# Patient Record
Sex: Female | Born: 1970 | Race: White | Hispanic: No | Smoking: Former smoker
Health system: Southern US, Community
[De-identification: ages and names within clinical notes are randomized; demographics above are authoritative.]

## PROBLEM LIST (undated history)

## (undated) DIAGNOSIS — N200 Calculus of kidney: Secondary | ICD-10-CM

## (undated) DIAGNOSIS — F329 Major depressive disorder, single episode, unspecified: Secondary | ICD-10-CM

## (undated) DIAGNOSIS — Z1371 Encounter for nonprocreative screening for genetic disease carrier status: Secondary | ICD-10-CM

## (undated) DIAGNOSIS — K859 Acute pancreatitis without necrosis or infection, unspecified: Secondary | ICD-10-CM

## (undated) DIAGNOSIS — I499 Cardiac arrhythmia, unspecified: Secondary | ICD-10-CM

## (undated) DIAGNOSIS — I639 Cerebral infarction, unspecified: Secondary | ICD-10-CM

## (undated) DIAGNOSIS — R519 Headache, unspecified: Secondary | ICD-10-CM

## (undated) DIAGNOSIS — R197 Diarrhea, unspecified: Secondary | ICD-10-CM

## (undated) DIAGNOSIS — A4902 Methicillin resistant Staphylococcus aureus infection, unspecified site: Secondary | ICD-10-CM

## (undated) DIAGNOSIS — R112 Nausea with vomiting, unspecified: Secondary | ICD-10-CM

## (undated) DIAGNOSIS — F32A Depression, unspecified: Secondary | ICD-10-CM

## (undated) DIAGNOSIS — N83209 Unspecified ovarian cyst, unspecified side: Secondary | ICD-10-CM

## (undated) DIAGNOSIS — Z9889 Other specified postprocedural states: Secondary | ICD-10-CM

## (undated) DIAGNOSIS — C50911 Malignant neoplasm of unspecified site of right female breast: Secondary | ICD-10-CM

## (undated) DIAGNOSIS — I10 Essential (primary) hypertension: Secondary | ICD-10-CM

## (undated) DIAGNOSIS — IMO0001 Reserved for inherently not codable concepts without codable children: Secondary | ICD-10-CM

## (undated) DIAGNOSIS — R51 Headache: Secondary | ICD-10-CM

## (undated) DIAGNOSIS — I429 Cardiomyopathy, unspecified: Secondary | ICD-10-CM

## (undated) DIAGNOSIS — K219 Gastro-esophageal reflux disease without esophagitis: Secondary | ICD-10-CM

## (undated) DIAGNOSIS — C50919 Malignant neoplasm of unspecified site of unspecified female breast: Secondary | ICD-10-CM

## (undated) DIAGNOSIS — F419 Anxiety disorder, unspecified: Secondary | ICD-10-CM

## (undated) DIAGNOSIS — Z5189 Encounter for other specified aftercare: Secondary | ICD-10-CM

## (undated) DIAGNOSIS — I517 Cardiomegaly: Secondary | ICD-10-CM

## (undated) DIAGNOSIS — D649 Anemia, unspecified: Secondary | ICD-10-CM

## (undated) HISTORY — PX: LITHOTRIPSY: SUR834

## (undated) HISTORY — PX: ABDOMINAL HYSTERECTOMY: SHX81

## (undated) HISTORY — DX: Encounter for nonprocreative screening for genetic disease carrier status: Z13.71

## (undated) HISTORY — PX: CHOLECYSTECTOMY: SHX55

## (undated) HISTORY — PX: HERNIA REPAIR: SHX51

## (undated) HISTORY — PX: TONSILLECTOMY: SUR1361

## (undated) HISTORY — PX: OOPHORECTOMY: SHX86

## (undated) HISTORY — DX: Malignant neoplasm of unspecified site of right female breast: C50.911

## (undated) HISTORY — PX: TUBAL LIGATION: SHX77

---

## 2007-04-07 DIAGNOSIS — A4902 Methicillin resistant Staphylococcus aureus infection, unspecified site: Secondary | ICD-10-CM

## 2007-04-07 HISTORY — DX: Methicillin resistant Staphylococcus aureus infection, unspecified site: A49.02

## 2008-12-31 ENCOUNTER — Emergency Department: Payer: Self-pay | Admitting: Emergency Medicine

## 2009-02-13 ENCOUNTER — Emergency Department: Payer: Self-pay | Admitting: Emergency Medicine

## 2009-07-26 ENCOUNTER — Emergency Department: Payer: Self-pay | Admitting: Internal Medicine

## 2009-09-02 ENCOUNTER — Emergency Department: Payer: Self-pay | Admitting: Internal Medicine

## 2009-12-28 ENCOUNTER — Emergency Department (HOSPITAL_COMMUNITY): Admission: EM | Admit: 2009-12-28 | Discharge: 2009-12-28 | Payer: Self-pay | Admitting: Emergency Medicine

## 2010-02-21 ENCOUNTER — Emergency Department (HOSPITAL_COMMUNITY): Admission: EM | Admit: 2010-02-21 | Discharge: 2010-02-21 | Payer: Self-pay | Admitting: Emergency Medicine

## 2010-05-05 ENCOUNTER — Emergency Department (HOSPITAL_COMMUNITY)
Admission: EM | Admit: 2010-05-05 | Discharge: 2010-05-05 | Payer: Self-pay | Source: Home / Self Care | Admitting: Emergency Medicine

## 2010-05-05 LAB — POCT I-STAT, CHEM 8
BUN: 19 mg/dL (ref 6–23)
Creatinine, Ser: 0.7 mg/dL (ref 0.4–1.2)
Glucose, Bld: 98 mg/dL (ref 70–99)
Hemoglobin: 12.2 g/dL (ref 12.0–15.0)
Potassium: 4.2 mEq/L (ref 3.5–5.1)

## 2010-05-05 LAB — URINALYSIS, ROUTINE W REFLEX MICROSCOPIC
Ketones, ur: NEGATIVE mg/dL
Nitrite: NEGATIVE
Specific Gravity, Urine: 1.03 (ref 1.005–1.030)
pH: 6 (ref 5.0–8.0)

## 2010-05-05 LAB — URINE MICROSCOPIC-ADD ON

## 2010-06-19 LAB — URINALYSIS, ROUTINE W REFLEX MICROSCOPIC
Glucose, UA: NEGATIVE mg/dL
Specific Gravity, Urine: 1.043 — ABNORMAL HIGH (ref 1.005–1.030)
Urobilinogen, UA: 0.2 mg/dL (ref 0.0–1.0)
pH: 5 (ref 5.0–8.0)

## 2010-06-19 LAB — URINE MICROSCOPIC-ADD ON

## 2010-06-19 LAB — PREGNANCY, URINE: Preg Test, Ur: NEGATIVE

## 2011-10-12 ENCOUNTER — Emergency Department: Payer: Self-pay | Admitting: *Deleted

## 2011-10-12 LAB — COMPREHENSIVE METABOLIC PANEL
Alkaline Phosphatase: 88 U/L (ref 50–136)
Anion Gap: 11 (ref 7–16)
Bilirubin,Total: 0.2 mg/dL (ref 0.2–1.0)
Chloride: 104 mmol/L (ref 98–107)
Co2: 23 mmol/L (ref 21–32)
Creatinine: 0.74 mg/dL (ref 0.60–1.30)
EGFR (Non-African Amer.): >60
Osmolality: 277 (ref 275–301)
Potassium: 4 mmol/L (ref 3.5–5.1)
Sodium: 138 mmol/L (ref 136–145)

## 2011-10-12 LAB — URINALYSIS, COMPLETE
Bilirubin,UR: NEGATIVE
Ketone: NEGATIVE
Nitrite: NEGATIVE
WBC UR: 4 /HPF (ref 0–5)

## 2011-10-12 LAB — CBC
HGB: 10.7 g/dL — ABNORMAL LOW (ref 12.0–16.0)
Platelet: 377 10*3/uL (ref 150–440)
RBC: 4.62 10*6/uL (ref 3.80–5.20)

## 2011-10-12 LAB — CK TOTAL AND CKMB (NOT AT ARMC)
CK, Total: 90 U/L (ref 21–215)
CK-MB: 1.5 ng/mL (ref 0.5–3.6)

## 2011-10-12 LAB — PREGNANCY, URINE: Pregnancy Test, Urine: NEGATIVE m[IU]/mL

## 2011-11-01 ENCOUNTER — Encounter (HOSPITAL_COMMUNITY): Payer: Self-pay | Admitting: *Deleted

## 2011-11-01 ENCOUNTER — Emergency Department (HOSPITAL_COMMUNITY)
Admission: EM | Admit: 2011-11-01 | Discharge: 2011-11-01 | Disposition: A | Discharge status: Home / Self Care | Payer: Self-pay | Attending: Emergency Medicine | Admitting: Emergency Medicine

## 2011-11-01 ENCOUNTER — Emergency Department (HOSPITAL_COMMUNITY): Payer: Self-pay

## 2011-11-01 DIAGNOSIS — R739 Hyperglycemia, unspecified: Secondary | ICD-10-CM

## 2011-11-01 DIAGNOSIS — R109 Unspecified abdominal pain: Secondary | ICD-10-CM

## 2011-11-01 DIAGNOSIS — Z9089 Acquired absence of other organs: Secondary | ICD-10-CM | POA: Insufficient documentation

## 2011-11-01 DIAGNOSIS — Z87891 Personal history of nicotine dependence: Secondary | ICD-10-CM | POA: Insufficient documentation

## 2011-11-01 DIAGNOSIS — E669 Obesity, unspecified: Secondary | ICD-10-CM | POA: Insufficient documentation

## 2011-11-01 DIAGNOSIS — N201 Calculus of ureter: Secondary | ICD-10-CM | POA: Insufficient documentation

## 2011-11-01 HISTORY — DX: Calculus of kidney: N20.0

## 2011-11-01 HISTORY — DX: Unspecified ovarian cyst, unspecified side: N83.209

## 2011-11-01 HISTORY — DX: Anemia, unspecified: D64.9

## 2011-11-01 LAB — URINALYSIS, ROUTINE W REFLEX MICROSCOPIC
Bilirubin Urine: NEGATIVE
Hgb urine dipstick: NEGATIVE
Ketones, ur: NEGATIVE mg/dL
Nitrite: NEGATIVE
Protein, ur: NEGATIVE mg/dL
Urobilinogen, UA: 1 mg/dL (ref 0.0–1.0)

## 2011-11-01 LAB — CBC WITH DIFFERENTIAL/PLATELET
Basophils Relative: 1 % (ref 0–1)
Eosinophils Absolute: 0.1 10*3/uL (ref 0.0–0.7)
Eosinophils Relative: 1 % (ref 0–5)
MCH: 23.7 pg — ABNORMAL LOW (ref 26.0–34.0)
MCHC: 31.5 g/dL (ref 30.0–36.0)
MCV: 75.2 fL — ABNORMAL LOW (ref 78.0–100.0)
Neutrophils Relative %: 70 % (ref 43–77)
Platelets: 389 10*3/uL (ref 150–400)

## 2011-11-01 LAB — BASIC METABOLIC PANEL
BUN: 20 mg/dL (ref 6–23)
Calcium: 9.4 mg/dL (ref 8.4–10.5)
GFR calc Af Amer: >90 mL/min (ref >90)
GFR calc non Af Amer: >90 mL/min (ref >90)
Glucose, Bld: 113 mg/dL — ABNORMAL HIGH (ref 70–99)
Potassium: 3.8 mEq/L (ref 3.5–5.1)
Sodium: 135 mEq/L (ref 135–145)

## 2011-11-01 MED ORDER — HYDROMORPHONE HCL PF 1 MG/ML IJ SOLN
1.0000 mg | Freq: Once | INTRAMUSCULAR | Status: AC
  Administered 2011-11-01: 1 mg via INTRAVENOUS
  Filled 2011-11-01: qty 1

## 2011-11-01 MED ORDER — SODIUM CHLORIDE 0.9 % IV SOLN
INTRAVENOUS | Status: DC
  Administered 2011-11-01: 14:00:00 via INTRAVENOUS

## 2011-11-01 MED ORDER — PROMETHAZINE HCL 25 MG PO TABS: 25.0000 mg | ORAL_TABLET | Freq: Four times a day (QID) | ORAL | Status: DC | PRN

## 2011-11-01 MED ORDER — PROMETHAZINE HCL 25 MG/ML IJ SOLN
25.0000 mg | Freq: Once | INTRAMUSCULAR | Status: AC
  Administered 2011-11-01: 25 mg via INTRAVENOUS
  Filled 2011-11-01 (×2): qty 1

## 2011-11-01 MED ORDER — OXYCODONE-ACETAMINOPHEN 5-325 MG PO TABS: 2.0000 | ORAL_TABLET | ORAL | Status: AC | PRN

## 2011-11-01 MED ORDER — ONDANSETRON HCL 4 MG/2ML IJ SOLN
4.0000 mg | Freq: Once | INTRAMUSCULAR | Status: AC
  Administered 2011-11-01: 4 mg via INTRAVENOUS
  Filled 2011-11-01: qty 2

## 2011-11-01 NOTE — ED Notes (Signed)
Pt states "this has been going on since last w/e, I've taken Azo but it hasn't helped, when I pee it feels like it's at my urethra, I had kidney stones removed surgically, I've also vomited all week"

## 2011-11-01 NOTE — ED Provider Notes (Signed)
History     CSN: [id number]  Arrival date & time 11/01/11  1143   First MD Initiated Contact with Patient 11/01/11 1245      Chief Complaint  Patient presents with  . Flank Pain  . Dysuria    (Consider location/radiation/quality/duration/timing/severity/associated sxs/prior treatment) Patient is a 41 y.o. female presenting with flank pain and dysuria. The history is provided by the patient.  Flank Pain Associated symptoms include abdominal pain. Pertinent negatives include no chest pain, no headaches and no shortness of breath.  Dysuria  Associated symptoms include nausea, frequency and flank pain. Pertinent negatives include no chills, no vomiting and no hematuria.   she has a history of kidney stones.  She presents emergency department complaining of back pain.  For approximately one week associated with nausea, but no vomiting.  She also has had dysuria and frequency.  She denies hematuria.  She denies cough, or shortness of breath.  She denies diarrhea.  Level V caveat applies for urgent need for intervention.  Because of severe pain  Past Medical History  Diagnosis Date  . Kidney stones   . Anemia   . Ovarian cyst     Past Surgical History  Procedure Date  . Cholecystectomy   . Tonsillectomy   . Hernia repair     No family history on file.  History  Substance Use Topics  . Smoking status: Former Games developer  . Smokeless tobacco: Not on file  . Alcohol Use: No    OB History    Grav Para Term Preterm Abortions TAB SAB Ect Mult Living                  Review of Systems  Constitutional: Negative for fever and chills.  Respiratory: Negative for cough and shortness of breath.   Cardiovascular: Negative for chest pain.  Gastrointestinal: Positive for nausea and abdominal pain. Negative for vomiting and diarrhea.  Genitourinary: Positive for dysuria, frequency and flank pain. Negative for hematuria.  Neurological: Negative for headaches.  All other systems reviewed  and are negative.    Allergies  Hydrocodone; Reglan; Clindamycin/lincomycin; and Morphine and related  Home Medications   Current Outpatient Rx  Name Route Sig Dispense Refill  . ACETAMINOPHEN 500 MG PO TABS Oral Take 1,500 mg by mouth every 6 (six) hours as needed. For pain    . FERROUS SULFATE 325 (65 FE) MG PO TABS Oral Take 325 mg by mouth daily with breakfast.    . KETOROLAC TROMETHAMINE 10 MG PO TABS Oral Take 10 mg by mouth every 6 (six) hours as needed. For pain    . RANITIDINE HCL 150 MG PO TABS Oral Take 150 mg by mouth daily.    Marland Kitchen VITAMIN C 500 MG PO TABS Oral Take 500 mg by mouth daily.      BP 151/90  Pulse 104  Temp 97.9 F (36.6 C) (Oral)  Resp 16  Wt 220 lb (99.791 kg)  SpO2 100%  LMP 10/18/2011  Physical Exam  Nursing note and vitals reviewed. Constitutional: She is oriented to person, place, and time.       Obese uncomfortable appearing  HENT:  Head: Normocephalic and atraumatic.  Eyes: Conjunctivae are normal.  Neck: Normal range of motion. Neck supple.  Cardiovascular: Regular rhythm and intact distal pulses.   No murmur heard.      Tachycardia  Pulmonary/Chest: Effort normal and breath sounds normal. No respiratory distress. She has no rales.  Abdominal: Soft. Bowel sounds are normal.  She exhibits no distension. There is tenderness. There is no rebound and no guarding.       Mild tenderness and) umbilical area with no peritoneal signs  Genitourinary:       Positive right CVA, tenderness  Musculoskeletal: Normal range of motion. She exhibits no edema and no tenderness.  Neurological: She is alert and oriented to person, place, and time.  Skin: Skin is warm and dry.  Psychiatric: She has a normal mood and affect. Thought content normal.    ED Course  Procedures (including critical care time) 41 year old, female, with symptoms consistent with kidney stone, or urinary tract infection.  We will establish an IV treat with analgesics, and antiemetics,  and perform laboratory testing, and a CAT scan of her abdomen   Labs Reviewed  URINALYSIS, ROUTINE W REFLEX MICROSCOPIC  CBC WITH DIFFERENTIAL  BASIC METABOLIC PANEL   No results found.   No diagnosis found.  3:12 PM Pain controlled   MDM  Right flank pain 2 mm ureteral stone.  No hydro.       Cheri Guppy, MD 11/01/11 [phone]

## 2011-11-10 ENCOUNTER — Emergency Department: Payer: Self-pay | Admitting: Emergency Medicine

## 2011-11-10 LAB — BASIC METABOLIC PANEL
BUN: 16 mg/dL (ref 7–18)
Chloride: 106 mmol/L (ref 98–107)
Glucose: 92 mg/dL (ref 65–99)
Osmolality: 280 (ref 275–301)
Potassium: 3.6 mmol/L (ref 3.5–5.1)

## 2011-11-10 LAB — URINALYSIS, COMPLETE
Glucose,UR: NEGATIVE mg/dL (ref 0–75)
Nitrite: NEGATIVE
RBC,UR: 2 /HPF (ref 0–5)
Specific Gravity: 1.027 (ref 1.003–1.030)
WBC UR: 20 /HPF (ref 0–5)

## 2011-11-10 LAB — CBC
MCH: 23.3 pg — ABNORMAL LOW (ref 26.0–34.0)
MCHC: 31.3 g/dL — ABNORMAL LOW (ref 32.0–36.0)
Platelet: 373 10*3/uL (ref 150–440)
RDW: 16.8 % — ABNORMAL HIGH (ref 11.5–14.5)

## 2011-12-17 ENCOUNTER — Emergency Department: Payer: Self-pay | Admitting: Emergency Medicine

## 2012-12-07 DIAGNOSIS — F329 Major depressive disorder, single episode, unspecified: Secondary | ICD-10-CM | POA: Insufficient documentation

## 2012-12-07 DIAGNOSIS — R55 Syncope and collapse: Secondary | ICD-10-CM | POA: Insufficient documentation

## 2012-12-07 DIAGNOSIS — D649 Anemia, unspecified: Secondary | ICD-10-CM | POA: Insufficient documentation

## 2012-12-07 DIAGNOSIS — Z8673 Personal history of transient ischemic attack (TIA), and cerebral infarction without residual deficits: Secondary | ICD-10-CM | POA: Insufficient documentation

## 2012-12-07 DIAGNOSIS — I10 Essential (primary) hypertension: Secondary | ICD-10-CM | POA: Insufficient documentation

## 2012-12-07 DIAGNOSIS — F32A Depression, unspecified: Secondary | ICD-10-CM | POA: Insufficient documentation

## 2013-01-24 ENCOUNTER — Emergency Department: Payer: Self-pay | Admitting: Emergency Medicine

## 2013-01-24 LAB — COMPREHENSIVE METABOLIC PANEL
Albumin: 3.6 g/dL (ref 3.4–5.0)
Anion Gap: 6 — ABNORMAL LOW (ref 7–16)
BUN: 18 mg/dL (ref 7–18)
Bilirubin,Total: 0.4 mg/dL (ref 0.2–1.0)
Creatinine: 0.75 mg/dL (ref 0.60–1.30)
EGFR (Non-African Amer.): >60
Glucose: 85 mg/dL (ref 65–99)
SGPT (ALT): 17 U/L (ref 12–78)
Sodium: 136 mmol/L (ref 136–145)
Total Protein: 7.3 g/dL (ref 6.4–8.2)

## 2013-01-24 LAB — CBC
MCH: 23.1 pg — ABNORMAL LOW (ref 26.0–34.0)
MCV: 72 fL — ABNORMAL LOW (ref 80–100)
Platelet: 385 10*3/uL (ref 150–440)
RBC: 4.13 10*6/uL (ref 3.80–5.20)
RDW: 17.8 % — ABNORMAL HIGH (ref 11.5–14.5)
WBC: 10.5 10*3/uL (ref 3.6–11.0)

## 2013-01-24 LAB — TROPONIN I: Troponin-I: <0.02 ng/mL

## 2013-10-28 ENCOUNTER — Inpatient Hospital Stay: Payer: Self-pay | Admitting: Internal Medicine

## 2013-10-28 LAB — URINALYSIS, COMPLETE
Bilirubin,UR: NEGATIVE
Blood: NEGATIVE
Glucose,UR: NEGATIVE mg/dL (ref 0–75)
Leukocyte Esterase: NEGATIVE
Nitrite: NEGATIVE
PROTEIN: NEGATIVE
Ph: 5 (ref 4.5–8.0)
RBC,UR: 5 /HPF (ref 0–5)
Specific Gravity: 1.026 (ref 1.003–1.030)
Squamous Epithelial: 9
WBC UR: 2 /HPF (ref 0–5)

## 2013-10-28 LAB — COMPREHENSIVE METABOLIC PANEL
ALBUMIN: 3.9 g/dL (ref 3.4–5.0)
ALK PHOS: 79 U/L
ANION GAP: 10 (ref 7–16)
BUN: 16 mg/dL (ref 7–18)
Bilirubin,Total: 0.8 mg/dL (ref 0.2–1.0)
CO2: 22 mmol/L (ref 21–32)
CREATININE: 0.83 mg/dL (ref 0.60–1.30)
Calcium, Total: 8.7 mg/dL (ref 8.5–10.1)
Chloride: 105 mmol/L (ref 98–107)
EGFR (African American): >60
GLUCOSE: 86 mg/dL (ref 65–99)
Osmolality: 274 (ref 275–301)
Potassium: 3.7 mmol/L (ref 3.5–5.1)
SGOT(AST): 24 U/L (ref 15–37)
SGPT (ALT): 18 U/L
Sodium: 137 mmol/L (ref 136–145)
TOTAL PROTEIN: 8.1 g/dL (ref 6.4–8.2)

## 2013-10-28 LAB — PREGNANCY, URINE: PREGNANCY TEST, URINE: NEGATIVE m[IU]/mL

## 2013-10-28 LAB — CBC WITH DIFFERENTIAL/PLATELET
BASOS ABS: 0.1 10*3/uL (ref 0.0–0.1)
Basophil %: 0.6 %
EOS ABS: 0.2 10*3/uL (ref 0.0–0.7)
EOS PCT: 0.9 %
HCT: 35.8 % (ref 35.0–47.0)
HGB: 10.8 g/dL — ABNORMAL LOW (ref 12.0–16.0)
Lymphocyte #: 2.8 10*3/uL (ref 1.0–3.6)
Lymphocyte %: 15.4 %
MCH: 22.3 pg — AB (ref 26.0–34.0)
MCHC: 30.2 g/dL — ABNORMAL LOW (ref 32.0–36.0)
MCV: 74 fL — ABNORMAL LOW (ref 80–100)
Monocyte #: 1 x10 3/mm — ABNORMAL HIGH (ref 0.2–0.9)
Monocyte %: 5.3 %
NEUTROS PCT: 77.8 %
Neutrophil #: 14.3 10*3/uL — ABNORMAL HIGH (ref 1.4–6.5)
Platelet: 452 10*3/uL — ABNORMAL HIGH (ref 150–440)
RBC: 4.85 10*6/uL (ref 3.80–5.20)
RDW: 17 % — ABNORMAL HIGH (ref 11.5–14.5)
WBC: 18.4 10*3/uL — ABNORMAL HIGH (ref 3.6–11.0)

## 2013-10-28 LAB — WET PREP, GENITAL

## 2013-10-28 LAB — LIPASE, BLOOD: Lipase: 66 U/L — ABNORMAL LOW (ref 73–393)

## 2013-10-29 LAB — BASIC METABOLIC PANEL
Anion Gap: 9 (ref 7–16)
BUN: 14 mg/dL (ref 7–18)
Calcium, Total: 8.1 mg/dL — ABNORMAL LOW (ref 8.5–10.1)
Chloride: 106 mmol/L (ref 98–107)
Co2: 22 mmol/L (ref 21–32)
Creatinine: 0.78 mg/dL (ref 0.60–1.30)
EGFR (Non-African Amer.): >60
Glucose: 101 mg/dL — ABNORMAL HIGH (ref 65–99)
OSMOLALITY: 274 (ref 275–301)
Potassium: 3.4 mmol/L — ABNORMAL LOW (ref 3.5–5.1)
Sodium: 137 mmol/L (ref 136–145)

## 2013-10-29 LAB — CBC WITH DIFFERENTIAL/PLATELET
Basophil #: 0.1 10*3/uL (ref 0.0–0.1)
Basophil %: 0.6 %
EOS PCT: 0.1 %
Eosinophil #: 0 10*3/uL (ref 0.0–0.7)
HCT: 28.3 % — ABNORMAL LOW (ref 35.0–47.0)
HGB: 8.5 g/dL — AB (ref 12.0–16.0)
LYMPHS ABS: 2.1 10*3/uL (ref 1.0–3.6)
Lymphocyte %: 17.4 %
MCH: 22.5 pg — AB (ref 26.0–34.0)
MCHC: 30 g/dL — ABNORMAL LOW (ref 32.0–36.0)
MCV: 75 fL — AB (ref 80–100)
Monocyte #: 0.7 x10 3/mm (ref 0.2–0.9)
Monocyte %: 5.5 %
Neutrophil #: 9.1 10*3/uL — ABNORMAL HIGH (ref 1.4–6.5)
Neutrophil %: 76.4 %
Platelet: 302 10*3/uL (ref 150–440)
RBC: 3.77 10*6/uL — ABNORMAL LOW (ref 3.80–5.20)
RDW: 16.7 % — AB (ref 11.5–14.5)
WBC: 11.9 10*3/uL — ABNORMAL HIGH (ref 3.6–11.0)

## 2013-10-29 LAB — GC/CHLAMYDIA PROBE AMP

## 2013-10-30 LAB — CBC WITH DIFFERENTIAL/PLATELET
BASOS PCT: 1.1 %
Basophil #: 0.1 10*3/uL (ref 0.0–0.1)
EOS ABS: 0.1 10*3/uL (ref 0.0–0.7)
EOS PCT: 1.7 %
HCT: 26.8 % — ABNORMAL LOW (ref 35.0–47.0)
HGB: 8.3 g/dL — ABNORMAL LOW (ref 12.0–16.0)
LYMPHS PCT: 33.5 %
Lymphocyte #: 2.1 10*3/uL (ref 1.0–3.6)
MCH: 23.3 pg — ABNORMAL LOW (ref 26.0–34.0)
MCHC: 31 g/dL — AB (ref 32.0–36.0)
MCV: 75 fL — ABNORMAL LOW (ref 80–100)
Monocyte #: 0.5 x10 3/mm (ref 0.2–0.9)
Monocyte %: 8.3 %
Neutrophil #: 3.5 10*3/uL (ref 1.4–6.5)
Neutrophil %: 55.4 %
PLATELETS: 253 10*3/uL (ref 150–440)
RBC: 3.56 10*6/uL — ABNORMAL LOW (ref 3.80–5.20)
RDW: 17.2 % — AB (ref 11.5–14.5)
WBC: 6.2 10*3/uL (ref 3.6–11.0)

## 2013-10-30 LAB — BASIC METABOLIC PANEL
Anion Gap: 8 (ref 7–16)
BUN: 8 mg/dL (ref 7–18)
CHLORIDE: 108 mmol/L — AB (ref 98–107)
Calcium, Total: 7.7 mg/dL — ABNORMAL LOW (ref 8.5–10.1)
Co2: 23 mmol/L (ref 21–32)
Creatinine: 0.72 mg/dL (ref 0.60–1.30)
EGFR (Non-African Amer.): >60
GLUCOSE: 90 mg/dL (ref 65–99)
OSMOLALITY: 275 (ref 275–301)
Potassium: 3.9 mmol/L (ref 3.5–5.1)
Sodium: 139 mmol/L (ref 136–145)

## 2014-04-06 DIAGNOSIS — C50919 Malignant neoplasm of unspecified site of unspecified female breast: Secondary | ICD-10-CM

## 2014-04-06 DIAGNOSIS — Z5189 Encounter for other specified aftercare: Secondary | ICD-10-CM

## 2014-04-06 HISTORY — DX: Malignant neoplasm of unspecified site of unspecified female breast: C50.919

## 2014-04-06 HISTORY — DX: Encounter for other specified aftercare: Z51.89

## 2014-04-06 HISTORY — PX: MASTECTOMY: SHX3

## 2014-07-28 NOTE — Discharge Summary (Signed)
PATIENT NAME:  Elizabeth Flynn, Elizabeth Flynn MR#:  [id number] DATE OF BIRTH:  May 31, 1970  DATE OF ADMISSION:  10/28/2013 DATE OF DISCHARGE:  10/30/2013  DISCHARGE DIAGNOSES:  1.  Acute colitis, improving, tolerated diet.  2.  Hypokalemia, repleted and resolved.  3.  Anemia of chronic disease, started on iron tablets, advised against nonsteroidal antiinflammatory drug use at home.   SECONDARY DIAGNOSES:  1.  Anemia of chronic disease.  2.  History of peptic ulcer disease.  3.  Chronic back pain.  4.  Hypertension.   CONSULTATIONS: None.   PROCEDURES AND RADIOLOGY:  CT scan of the abdomen and pelvis with contrast on the July 25 showed colitis. Bilateral parapelvic renal cysts.   LABORATORY PANEL: Urinalysis on admission was negative. Wet prep smear showed no clue cells but some amount of yeast; there were WBCs.   Chlamydia and Neisseria gonorrhoeae.  PCR was negative. Urine pregnancy test was negative.   HISTORY AND SHORT HOSPITAL COURSE: The patient is a 44 year old female with the above-mentioned medical problems who was admitted for nausea, vomiting, diarrhea, and abdominal pain that was thought to be secondary to colitis based on the CT scan. Please see Dr. Ward Givens dictated history and physical for further details. The patient was started on IV antibiotics, was slowly improving, was able to tolerate diet by July 27 and was close to her baseline. She was discharged home in stable condition.   On the date of discharge, her vital signs are as follows: Temperature 98, heart rate 66 per minute, respirations 19 per minute, blood pressure 147/82 mmHg, her oxygen saturation was 97% on room air.   PERTINENT PHYSICAL EXAMINATION ON THE DATE OF DISCHARGE:  CARDIOVASCULAR: S1, S2 normal. No murmurs, rubs, or gallop.  LUNGS: Clear to auscultation bilaterally. No wheezing, rales, rhonchi, or crepitation.  ABDOMEN: Soft, benign.  NEUROLOGIC: Nonfocal examination.   All of the physical examination  remained at baseline.   DISCHARGE MEDICATIONS:  1.  Propranolol 10 mg p.o. daily.  2.  Iron sulfate 325 mg p.o. 3 times a day.  3.  Vitamin C 1 tablet p.o. twice a day.  4.  Ciprofloxacin 500 mg p.o. b.i.d. for 10 days.  5.  Flagyl 500 mg p.o. every 8 hours for 10 days.   DISCHARGE DIET: Low sodium. Eat light for the first 24 to 48 hours.   DISCHARGE ACTIVITY: As tolerated.   DISCHARGE INSTRUCTIONS AND FOLLOWUP: The patient was instructed to follow up with her primary care provider, Terance Hart. Tobin Chad, NP in 1 to 2 weeks. She will need followup with [facility] GI clinic in 2 to 4 weeks.   TOTAL TIME DISCHARGING THIS PATIENT: 45 minutes.    ____________________________ Lucina Mellow. Manuella Ghazi, MD vss:lt D: 11/02/2013 06:49:53 ET T: 11/02/2013 07:11:40 ET JOB#: [id number]  cc: Cloys Vera S. Manuella Ghazi, MD, <Dictator> [place] Tobin Chad, NP [place] MD ELECTRONICALLY SIGNED 11/02/2013 19:51

## 2014-07-28 NOTE — H&P (Signed)
PATIENT NAME:  Elizabeth Flynn, FLIGHT MR#:  [id number] DATE OF BIRTH:  May 27, 1970  DATE OF ADMISSION:  10/28/2013  PRIMARY CARE PHYSICIAN:  Dr. Estell Harpin.   REFERRING PHYSICIAN:  Dr. Delman Kitten.   CHIEF COMPLAINT:  Nausea, vomiting and diarrhea, abdominal pain.   HISTORY OF PRESENT ILLNESS:  Elizabeth Flynn is a 44 year old female with history of anemia who comes to the Emergency Department with complaints of abdominal pain in the right lower quadrant started since this morning.  The patient also has been experiencing multiple episodes of nausea, vomiting and diarrhea.  Denies having any sick contacts.  Denies eating any food from outside.  Had some low grade fever at home.  Concerning this, came to the Emergency Department.  Work-up in the Emergency Department with CT abdomen and pelvis with contrast shows minimal wall thickening of the transverse colon, also commented as 2.6 cm hypodense lesion at the left side of the cervix with vaguely decreased attenuation for the cervix and vagina of uncertain significance, recommended to follow up with the Pap smear results.  The patient was also found to have elevated white blood cell count of 18,000 with a left shift.  The patient received Cipro and Flagyl in the Emergency Department.   PAST MEDICAL HISTORY:  1.  Anemia.  2.  History of peptic ulcer disease.  3.  Chronic back pain.  4.  Previous history of pancreatitis.  5.  Hypertension.   PAST SURGICAL HISTORY:  1.  Hernia repair.  2.  Lithotripsy.  3.  Cholecystectomy.   ALLERGIES:  1.  VICODIN.  2.  CLARITHROMYCIN.  3.  MORPHINE.   HOME MEDICATIONS: 1.  Vitamin C 1 tablet 2 times a day.  2.  Propanol 10 mg once a day.  3.  Naprosyn 500 mg 2 times a day.  4.  Ferrous sulfate 325 mg 3 times a day.   SOCIAL HISTORY:  No history of smoking, drinking alcohol or using illicit drugs.  Lives with her partner.  Currently not working.   FAMILY HISTORY:  Hypertension.   REVIEW OF  SYSTEMS: CONSTITUTIONAL:  Experiencing generalized weakness.  EYES:  No change in vision.  EARS, NOSE, THROAT:  No change in hearing.  RESPIRATORY:  No cough, shortness of breath.  CARDIOVASCULAR:  No chest pain, palpations.  GASTROINTESTINAL:  Has nausea, vomiting, abdominal pain and diarrhea.  GENITOURINARY:  No dysuria or hematuria.  HEMATOLOGIC:  No easy bruising or bleeding.  SKIN:  No rash or lesions.  MUSCULOSKELETAL:  No joint pains and aches.  NEUROLOGIC:  No weakness or numbness in any part of the body.   PHYSICAL EXAMINATION: GENERAL:  This is a well-built, well-nourished, age-appropriate female lying down in the bed, not in distress.  VITAL SIGNS:  Temperature 98, pulse 84, blood pressure 177/98, respiratory rate of 20, oxygen saturation is 97% on room air.  HEENT:  Head normocephalic, atraumatic.  Eyes, no scleral icterus.  Conjunctivae normal.  Pupils equal and react to light.  Extraocular movements are intact.  Mucous membranes moist.  No pharyngeal erythema.  NECK:  Supple.  No lymphadenopathy.  No JVD.  No carotid bruit.  CHEST:  Has no focal tenderness.  LUNGS:  Bilaterally clear to auscultation.  HEART:  S1, S2 regular.  No murmurs are heard.  ABDOMEN:  Bowel sounds present.  Soft.  Has tenderness in the right lower quadrant.  No rebound or guarding.  Could not appreciate any hepatosplenomegaly.  EXTREMITIES:  No pedal edema.  Pulses 2+.  NEUROLOGIC:  The patient is alert, oriented to place, person, and time.  Cranial nerves II through XII intact.  Motor 5 by 5 in upper and lower extremities.   LABORATORY DATA:  CBC:  WBC of 18.4, hemoglobin 10.8, platelet count of 452.   UA negative for nitrites and leukocyte esterase.   CT abdomen and pelvis with contrast, as mentioned above minimal wall thickening along the transverse colon, next a 2.6 cm hypodense lesion at the left side of the cervix, bilateral parapelvic renal cysts.   ASSESSMENT AND PLAN:  Elizabeth Flynn is a  44 year old female who comes to the Emergency Department with nausea, vomiting and diarrhea.  1.  Gastroenteritis.  Keep the patient nothing by mouth.  Continue with intravenous fluids.  Keep the patient on Cipro and Flagyl.  2.  Hypertension, currently well-controlled.  Continue with the home medications.  3.  Anemia.  The patient has hemoglobin of 10.8, in acceptable range.  The patient has a known history of peptic ulcer disease; however, the patient is on Naprosyn.  We will discourage the patient to take any nonsteroidal anti-inflammatory drugs.  4.  Keep the patient on deep vein thrombosis prophylaxis with sequential compression devices.   TIME SPENT:  50 minutes.     ____________________________ Monica Becton, MD pv:ea D: 10/28/2013 23:52:45 ET T: 10/29/2013 00:45:22 ET JOB#: [id number]  cc: Monica Becton, MD, <Dictator> Dr. Estell Harpin [facility] Reonna Finlayson MD ELECTRONICALLY SIGNED 11/09/2013 21:05

## 2014-08-01 ENCOUNTER — Emergency Department
Admit: 2014-08-01 | Disposition: A | Discharge status: Home / Self Care | Payer: Self-pay | Admitting: Emergency Medicine

## 2014-08-01 LAB — CBC WITH DIFFERENTIAL/PLATELET
Basophil #: 0.1 10*3/uL (ref 0.0–0.1)
Basophil %: 1.3 %
EOS ABS: 0.1 10*3/uL (ref 0.0–0.7)
EOS PCT: 0.6 %
HCT: 33.4 % — ABNORMAL LOW (ref 35.0–47.0)
HGB: 10.2 g/dL — ABNORMAL LOW (ref 12.0–16.0)
LYMPHS ABS: 2.2 10*3/uL (ref 1.0–3.6)
Lymphocyte %: 26.1 %
MCH: 22.7 pg — AB (ref 26.0–34.0)
MCHC: 30.5 g/dL — AB (ref 32.0–36.0)
MCV: 74 fL — ABNORMAL LOW (ref 80–100)
MONO ABS: 0.5 x10 3/mm (ref 0.2–0.9)
Monocyte %: 6.1 %
Neutrophil #: 5.7 10*3/uL (ref 1.4–6.5)
Neutrophil %: 65.9 %
PLATELETS: 291 10*3/uL (ref 150–440)
RBC: 4.49 10*6/uL (ref 3.80–5.20)
RDW: 16.8 % — AB (ref 11.5–14.5)
WBC: 8.6 10*3/uL (ref 3.6–11.0)

## 2014-08-01 LAB — COMPREHENSIVE METABOLIC PANEL
Albumin: 3.9 g/dL
Alkaline Phosphatase: 61 U/L
Anion Gap: 8 (ref 7–16)
BUN: 16 mg/dL
Bilirubin,Total: 0.4 mg/dL
CREATININE: 0.63 mg/dL
Calcium, Total: 8.8 mg/dL — ABNORMAL LOW
Chloride: 109 mmol/L
Co2: 24 mmol/L
Glucose: 85 mg/dL
Potassium: 3.5 mmol/L
SGOT(AST): 29 U/L
SGPT (ALT): 20 U/L
SODIUM: 141 mmol/L
Total Protein: 7.1 g/dL

## 2014-08-01 LAB — URINALYSIS, COMPLETE
Bilirubin,UR: NEGATIVE
Blood: NEGATIVE
Glucose,UR: NEGATIVE mg/dL (ref 0–75)
Leukocyte Esterase: NEGATIVE
Nitrite: NEGATIVE
PH: 6 (ref 4.5–8.0)
SPECIFIC GRAVITY: 1.026 (ref 1.003–1.030)

## 2014-08-12 ENCOUNTER — Emergency Department
Admission: EM | Admit: 2014-08-12 | Discharge: 2014-08-12 | Disposition: A | Discharge status: Home / Self Care | Payer: Self-pay | Attending: Emergency Medicine | Admitting: Emergency Medicine

## 2014-08-12 DIAGNOSIS — R102 Pelvic and perineal pain: Secondary | ICD-10-CM

## 2014-08-12 DIAGNOSIS — K644 Residual hemorrhoidal skin tags: Secondary | ICD-10-CM | POA: Insufficient documentation

## 2014-08-12 DIAGNOSIS — I10 Essential (primary) hypertension: Secondary | ICD-10-CM | POA: Insufficient documentation

## 2014-08-12 DIAGNOSIS — Z79899 Other long term (current) drug therapy: Secondary | ICD-10-CM | POA: Insufficient documentation

## 2014-08-12 DIAGNOSIS — Z87891 Personal history of nicotine dependence: Secondary | ICD-10-CM | POA: Insufficient documentation

## 2014-08-12 HISTORY — DX: Essential (primary) hypertension: I10

## 2014-08-12 HISTORY — DX: Encounter for other specified aftercare: Z51.89

## 2014-08-12 HISTORY — DX: Cerebral infarction, unspecified: I63.9

## 2014-08-12 LAB — URINALYSIS COMPLETE WITH MICROSCOPIC (ARMC ONLY)
Bacteria, UA: NONE SEEN
Bilirubin Urine: NEGATIVE
Glucose, UA: NEGATIVE mg/dL
Hgb urine dipstick: NEGATIVE
LEUKOCYTES UA: NEGATIVE
NITRITE: NEGATIVE
PH: 5 (ref 5.0–8.0)
Protein, ur: NEGATIVE mg/dL
Specific Gravity, Urine: 1.032 — ABNORMAL HIGH (ref 1.005–1.030)

## 2014-08-12 MED ORDER — PROMETHAZINE HCL 25 MG PO TABS
25.0000 mg | ORAL_TABLET | Freq: Once | ORAL | Status: AC
  Administered 2014-08-12: 25 mg via ORAL

## 2014-08-12 MED ORDER — OXYCODONE-ACETAMINOPHEN 5-325 MG PO TABS
1.0000 | ORAL_TABLET | Freq: Once | ORAL | Status: AC
Start: 2014-08-12 — End: 2014-08-12
  Administered 2014-08-12: 1 via ORAL

## 2014-08-12 MED ORDER — PROMETHAZINE HCL 25 MG PO TABS: 25.0000 mg | ORAL_TABLET | Freq: Four times a day (QID) | ORAL | Status: DC | PRN

## 2014-08-12 MED ORDER — PROMETHAZINE HCL 25 MG PO TABS
ORAL_TABLET | ORAL | Status: AC
  Filled 2014-08-12: qty 1

## 2014-08-12 MED ORDER — OXYCODONE-ACETAMINOPHEN 5-325 MG PO TABS: 1.0000 | ORAL_TABLET | Freq: Four times a day (QID) | ORAL | Status: DC | PRN

## 2014-08-12 MED ORDER — OXYCODONE-ACETAMINOPHEN 5-325 MG PO TABS
ORAL_TABLET | ORAL | Status: AC
  Filled 2014-08-12: qty 1

## 2014-08-12 NOTE — Discharge Instructions (Signed)
Hemorrhoids °Hemorrhoids are swollen veins around the rectum or anus. There are two types of hemorrhoids:  °· Internal hemorrhoids. These occur in the veins just inside the rectum. They may poke through to the outside and become irritated and painful. °· External hemorrhoids. These occur in the veins outside the anus and can be felt as a painful swelling or hard lump near the anus. °CAUSES °· Pregnancy.   °· Obesity.   °· Constipation or diarrhea.   °· Straining to have a bowel movement.   °· Sitting for long periods on the toilet. °· Heavy lifting or other activity that caused you to strain. °· Anal intercourse. °SYMPTOMS  °· Pain.   °· Anal itching or irritation.   °· Rectal bleeding.   °· Fecal leakage.   °· Anal swelling.   °· One or more lumps around the anus.   °DIAGNOSIS  °Your caregiver may be able to diagnose hemorrhoids by visual examination. Other examinations or tests that may be performed include:  °· Examination of the rectal area with a gloved hand (digital rectal exam).   °· Examination of anal canal using a small tube (scope).   °· A blood test if you have lost a significant amount of blood. °· A test to look inside the colon (sigmoidoscopy or colonoscopy). °TREATMENT °Most hemorrhoids can be treated at home. However, if symptoms do not seem to be getting better or if you have a lot of rectal bleeding, your caregiver may perform a procedure to help make the hemorrhoids get smaller or remove them completely. Possible treatments include:  °· Placing a rubber band at the base of the hemorrhoid to cut off the circulation (rubber band ligation).   °· Injecting a chemical to shrink the hemorrhoid (sclerotherapy).   °· Using a tool to burn the hemorrhoid (infrared light therapy).   °· Surgically removing the hemorrhoid (hemorrhoidectomy).   °· Stapling the hemorrhoid to block blood flow to the tissue (hemorrhoid stapling).   °HOME CARE INSTRUCTIONS  °· Eat foods with fiber, such as whole grains, beans,  nuts, fruits, and vegetables. Ask your doctor about taking products with added fiber in them (fiber supplements). °· Increase fluid intake. Drink enough water and fluids to keep your urine clear or pale yellow.   °· Exercise regularly.   °· Go to the bathroom when you have the urge to have a bowel movement. Do not wait.   °· Avoid straining to have bowel movements.   °· Keep the anal area dry and clean. Use wet toilet paper or moist towelettes after a bowel movement.   °· Medicated creams and suppositories may be used or applied as directed.   °· Only take over-the-counter or prescription medicines as directed by your caregiver.   °· Take warm sitz baths for 15-20 minutes, 3-4 times a day to ease pain and discomfort.   °· Place ice packs on the hemorrhoids if they are tender and swollen. Using ice packs between sitz baths may be helpful.   °· Put ice in a plastic bag.   °· Place a towel between your skin and the bag.   °· Leave the ice on for 15-20 minutes, 3-4 times a day.   °· Do not use a donut-shaped pillow or sit on the toilet for long periods. This increases blood pooling and pain.   °SEEK MEDICAL CARE IF: °· You have increasing pain and swelling that is not controlled by treatment or medicine. °· You have uncontrolled bleeding. °· You have difficulty or you are unable to have a bowel movement. °· You have pain or inflammation outside the area of the hemorrhoids. °MAKE SURE YOU: °· Understand these instructions. °·   Will watch your condition.  Will get help right away if you are not doing well or get worse. Document Released: 03/20/2000 Document Revised: 03/09/2012 Document Reviewed: 01/26/2012 [facility] Patient Information 2015 [place], [place]. This information is not intended to replace advice given to you by your health care provider. Make sure you discuss any questions you have with your health care provider.  Pelvic Pain Female pelvic pain can be caused by many different things and start from a  variety of places. Pelvic pain refers to pain that is located in the lower half of the abdomen and between your hips. The pain may occur over a short period of time (acute) or may be reoccurring (chronic). The cause of pelvic pain may be related to disorders affecting the female reproductive organs (gynecologic), but it may also be related to the bladder, kidney stones, an intestinal complication, or muscle or skeletal problems. Getting help right away for pelvic pain is important, especially if there has been severe, sharp, or a sudden onset of unusual pain. It is also important to get help right away because some types of pelvic pain can be life threatening.  CAUSES  Below are only some of the causes of pelvic pain. The causes of pelvic pain can be in one of several categories.   Gynecologic.  Pelvic inflammatory disease.  Sexually transmitted infection.  Ovarian cyst or a twisted ovarian ligament (ovarian torsion).  Uterine lining that grows outside the uterus (endometriosis).  Fibroids, cysts, or tumors.  Ovulation.  Pregnancy.  Pregnancy that occurs outside the uterus (ectopic pregnancy).  Miscarriage.  Labor.  Abruption of the placenta or ruptured uterus.  Infection.  Uterine infection (endometritis).  Bladder infection.  Diverticulitis.  Miscarriage related to a uterine infection (septic abortion).  Bladder.  Inflammation of the bladder (cystitis).  Kidney stone(s).  Gastrointestinal.  Constipation.  Diverticulitis.  Neurologic.  Trauma.  Feeling pelvic pain because of mental or emotional causes (psychosomatic).  Cancers of the bowel or pelvis. EVALUATION  Your caregiver will want to take a careful history of your concerns. This includes recent changes in your health, a careful gynecologic history of your periods (menses), and a sexual history. Obtaining your family history and medical history is also important. Your caregiver may suggest a pelvic  exam. A pelvic exam will help identify the location and severity of the pain. It also helps in the evaluation of which organ system may be involved. In order to identify the cause of the pelvic pain and be properly treated, your caregiver may order tests. These tests may include:   A pregnancy test.  Pelvic ultrasonography.  An X-ray exam of the abdomen.  A urinalysis or evaluation of vaginal discharge.  Blood tests. HOME CARE INSTRUCTIONS   Only take over-the-counter or prescription medicines for pain, discomfort, or fever as directed by your caregiver.   Rest as directed by your caregiver.   Eat a balanced diet.   Drink enough fluids to make your urine clear or pale yellow, or as directed.   Avoid sexual intercourse if it causes pain.   Apply warm or cold compresses to the lower abdomen depending on which one helps the pain.   Avoid stressful situations.   Keep a journal of your pelvic pain. Write down when it started, where the pain is located, and if there are things that seem to be associated with the pain, such as food or your menstrual cycle.  Follow up with your caregiver as directed.  [place]  IF:  Your medicine does not help your pain.  You have abnormal vaginal discharge. SEEK IMMEDIATE MEDICAL CARE IF:   You have heavy bleeding from the vagina.   Your pelvic pain increases.   You feel light-headed or faint.   You have chills.   You have pain with urination or blood in your urine.   You have uncontrolled diarrhea or vomiting.   You have a fever or persistent symptoms for more than 3 days.  You have a fever and your symptoms suddenly get worse.   You are being physically or sexually abused.  MAKE SURE YOU:  Understand these instructions.  Will watch your condition.  Will get help if you are not doing well or get worse. Document Released: 02/18/2004 Document Revised: 08/07/2013 Document Reviewed: 07/13/2011 [facility]  Patient Information 2015 [place], [place]. This information is not intended to replace advice given to you by your health care provider. Make sure you discuss any questions you have with your health care provider. . Take Percocet as prescribed. Do not drink alcohol, drive or participate in any other potentially dangerous activities while taking this medication as it may make you sleepy. Do not take this medication with any other sedating medications, either prescription or over-the-counter. If you were prescribed Percocet or Vicodin, do not take these with acetaminophen (Tylenol) as it is already contained within these medications.   This medication is an opiate (or narcotic) pain medication and can be habit forming.  Use it as little as possible to achieve adequate pain control.  Do not use or use it with extreme caution if you have a history of opiate abuse or dependence.  If you are on a pain contract with your primary care doctor or a pain specialist, be sure to let them know you were prescribed this medication today from the [facility] Emergency Department.  This medication is intended for your use only - do not give any to anyone else and keep it in a secure place where nobody else, especially children, have access to it.  It will also cause or worsen constipation, so you may want to consider taking an over-the-counter stool softener while you are taking this medication.

## 2014-08-12 NOTE — ED Provider Notes (Signed)
[facility] Emergency Department Provider Note  ____________________________________________  Time seen: 10:00 PM  I have reviewed the triage vital signs and the nursing notes.   HISTORY  Chief Complaint Rectal Pain and Pelvic Pain    HPI Elizabeth Flynn is a 44 y.o. female who complains of rectal and pelvic pain for the last 3 or 4 weeks. She was seen in the ED 3 weeks ago, had a CT scan which showed a 6 cm mass on her cervix she has not followed up with primary care or gynecologist yet. She denies any fever, chills, nausea, vomiting, chest pain, shortness of breath or dizziness. No dysuria, frequency or urgency. No vaginal bleeding or discharge, patient stop in sexual active in 5 years.     Past Medical History  Diagnosis Date  . Kidney stones   . Anemia   . Ovarian cyst   . Blood transfusion without reported diagnosis   . Hypertension   . Stroke     Sept. 2014    There are no active problems to display for this patient.   Past Surgical History  Procedure Laterality Date  . Cholecystectomy    . Tonsillectomy    . Hernia repair      Current Outpatient Rx  Name  Route  Sig  Dispense  Refill  . acetaminophen (TYLENOL) 500 MG tablet   Oral   Take 1,500 mg by mouth every 6 (six) hours as needed. For pain         . ferrous sulfate 325 (65 FE) MG tablet   Oral   Take 325 mg by mouth daily with breakfast.         . vitamin C (ASCORBIC ACID) 500 MG tablet   Oral   Take 500 mg by mouth daily.         Marland Kitchen oxyCODONE-acetaminophen (ROXICET) 5-325 MG per tablet   Oral   Take 1 tablet by mouth every 6 (six) hours as needed for severe pain.   12 tablet   0   . EXPIRED: promethazine (PHENERGAN) 25 MG tablet   Oral   Take 1 tablet (25 mg total) by mouth every 6 (six) hours as needed for nausea.   30 tablet   0   . promethazine (PHENERGAN) 25 MG tablet   Oral   Take 1 tablet (25 mg total) by mouth every 6 (six) hours as needed for  nausea or vomiting.   15 tablet   0   . ranitidine (ZANTAC) 150 MG tablet   Oral   Take 150 mg by mouth daily.           Allergies Hydrocodone; Reglan; Clindamycin/lincomycin; and Morphine and related  History reviewed. No pertinent family history.  Social History History  Substance Use Topics  . Smoking status: Former Research scientist (life sciences)  . Smokeless tobacco: Not on file  . Alcohol Use: No    Review of Systems  Constitutional: No fever or chills. No weight changes Eyes:No blurry vision or double vision.  ENT: No sore throat. Cardiovascular: No chest pain. Respiratory: No dyspnea or cough. Gastrointestinal: Negative for abdominal pain, vomiting and diarrhea.  No BRBPR or melena. Genitourinary: Negative for dysuria, urinary retention, bloody urine, or difficulty urinating. Musculoskeletal: Negative for back pain. No joint swelling or pain. Skin: Negative for rash. Neurological: Negative for headaches, focal weakness or numbness. Psychiatric:No anxiety or depression.   Endocrine:No hot/cold intolerance, changes in energy, or sleep difficulty.  10-point ROS otherwise negative.  ____________________________________________  PHYSICAL EXAM:  VITAL SIGNS: ED Triage Vitals  Enc Vitals Group     BP 08/12/14 2123 165/89 mmHg     Pulse Rate 08/12/14 2123 94     Resp 08/12/14 2123 20     Temp 08/12/14 2123 98.4 F (36.9 C)     Temp Source 08/12/14 2123 Oral     SpO2 08/12/14 2123 99 %     Weight 08/12/14 2123 203 lb (92.08 kg)     Height 08/12/14 2123 5\' 7"  (1.702 m)     Head Cir --      Peak Flow --      Pain Score 08/12/14 2126 9     Pain Loc --      Pain Edu? --      Excl. in [place]? --      Constitutional: Alert and oriented. Well appearing and in no distress. Eyes: No scleral icterus. No conjunctival pallor. PERRL. EOMI ENT   Head: Normocephalic and atraumatic.   Nose: No congestion/rhinnorhea. No septal hematoma   Mouth/Throat: MMM, no pharyngeal  erythema   Neck: No stridor. No SubQ emphysema.  Hematological/Lymphatic/Immunilogical: No cervical lymphadenopathy. Cardiovascular: RRR. Normal and symmetric distal pulses are present in all extremities. No murmurs, rubs, or gallops. Respiratory: Normal respiratory effort without tachypnea nor retractions. Breath sounds are clear and equal bilaterally. No wheezes/rales/rhonchi. Gastrointestinal: Soft and nontender. No distention. There is no CVA tenderness.  No rebound, rigidity, or guarding. Rectal exam shows external hemorrhoids and Hemoccult negative. Genitourinary: deferred Musculoskeletal: Nontender with normal range of motion in all extremities. No joint effusions.  No lower extremity tenderness.  No edema. Neurologic:   Normal speech and language.  CN 2-10 normal. Motor grossly intact. No pronator drift.  Normal gait. No gross focal neurologic deficits are appreciated.  Skin:  Skin is warm, dry and intact. No rash noted.  No petechiae, purpura, or bullae. Psychiatric: Mood and affect are normal. Speech and behavior are normal. Patient exhibits appropriate insight and judgment.  ____________________________________________    LABS (pertinent positives/negatives) (all labs ordered are listed, but only abnormal results are displayed) Labs Reviewed  URINALYSIS COMPLETEWITH MICROSCOPIC ([place])  - Abnormal; Notable for the following:    Color, Urine YELLOW (*)    APPearance CLEAR (*)    Ketones, ur TRACE (*)    Specific Gravity, Urine 1.032 (*)    Squamous Epithelial / LPF 0-5 (*)    All other components within normal limits   ____________________________________________   EKG    ____________________________________________    RADIOLOGY    ____________________________________________   PROCEDURES  ____________________________________________   INITIAL IMPRESSION / [place] / ED COURSE  Pertinent labs & imaging results that were available during  my care of the patient were reviewed by me and considered in my medical decision making (see chart for details).  The patient's pelvic and rectal pain is consistent with her 6 cm cervical mass. She should follow up with gynecology for this area. She reports that she can follow up with the [facility], who will provide her with screening exams and biopsies for free. I'll prescribe her Percocet and Phenergan for her pain and nausea as she has not been known control her symptoms with Tylenol and ibuprofen at home. Patient is medically stable at this time and did not think that repeating labs or CT scan will be of further benefit. I have low suspicion of appendicitis at this time or torsion.  ____________________________________________   FINAL CLINICAL IMPRESSION(S) / ED  DIAGNOSES  Final diagnoses:  Pelvic pain in female  External hemorrhoid      Carrie Mew, MD 08/12/14 [phone]

## 2014-08-12 NOTE — ED Notes (Addendum)
Pt states "something is not right, when I sit down." Pt c/o rectal pain. pt states she was seen several weeks ago for same thing, but has not f/u with PCP. Pt denies discharge. Pt recently had CT

## 2014-08-12 NOTE — ED Notes (Signed)
Pt provided with paper info on [place].

## 2014-09-12 ENCOUNTER — Ambulatory Visit
Admission: RE | Admit: 2014-09-12 | Discharge: 2014-09-12 | Disposition: A | Discharge status: Home / Self Care | Payer: Self-pay | Source: Ambulatory Visit | Attending: Oncology | Admitting: Oncology

## 2014-09-12 ENCOUNTER — Ambulatory Visit: Payer: Self-pay | Attending: Oncology

## 2014-09-12 ENCOUNTER — Other Ambulatory Visit: Payer: Self-pay | Admitting: Oncology

## 2014-09-12 VITALS — BP 154/95 | HR 76 | Temp 98.1°F | Ht 66.0 in | Wt 230.0 lb

## 2014-09-12 DIAGNOSIS — Z0000 Encounter for general adult medical examination without abnormal findings: Secondary | ICD-10-CM

## 2014-09-12 NOTE — Progress Notes (Signed)
Subjective:     Patient ID: Elizabeth Flynn, female   DOB: 1970/10/25, 44 y.o.   MRN: [id number]  HPI   Review of Systems     Objective:   Physical Exam  Pulmonary/Chest: Right breast exhibits no inverted nipple, no mass, no nipple discharge, no skin change and no tenderness. Left breast exhibits no inverted nipple, no mass, no nipple discharge, no skin change and no tenderness. Breasts are symmetrical.  Genitourinary: Rectal exam shows no mass and no tenderness. There is no tenderness, lesion or injury on the right labia. There is no rash, tenderness, lesion or injury on the left labia. Uterus is not deviated, not fixed and not tender. Cervix exhibits no motion tenderness, no discharge and no friability. Right adnexum displays no mass, no tenderness and no fullness. Left adnexum displays no mass, no tenderness and no fullness. No erythema or tenderness in the vagina. No vaginal discharge found.       Assessment:     44 year old patient presents for [facility] clinic visit. Patient screened, and meets [facility] eligibility.  Patient does not have insurance, Medicare or Medicaid.  Handout given on Affordable Care Act.  Instructed patient on breast self-exam using teach back method.  CBE unremarkable. Patient presented to ED in May with pelvic pain, and complaints of vaginal bleeding every 2 weeks.  CT scan with no pelvic abnormalities at that time except for soft tissue near cervix.  Pelvic exam today normal, with slight bleeding on pap specimen collection. Will contact PCP when pap results received. Discussed need for GYN consult with patient.  PCP associated with [facility], and can make referral to [facility] clinic if needed.   Plan:     Sent for bilateral screening mammogram.   Specimen collected for pap.

## 2014-09-14 ENCOUNTER — Other Ambulatory Visit: Payer: Self-pay

## 2014-09-14 ENCOUNTER — Other Ambulatory Visit: Payer: Self-pay | Admitting: Oncology

## 2014-09-14 DIAGNOSIS — N63 Unspecified lump in unspecified breast: Secondary | ICD-10-CM

## 2014-09-14 DIAGNOSIS — R928 Other abnormal and inconclusive findings on diagnostic imaging of breast: Secondary | ICD-10-CM

## 2014-09-17 LAB — PAP LB AND HPV HIGH-RISK
HPV, high-risk: NEGATIVE
PAP Smear Comment: 0

## 2014-09-25 ENCOUNTER — Encounter: Payer: Self-pay | Admitting: *Deleted

## 2014-09-25 ENCOUNTER — Ambulatory Visit
Admission: RE | Admit: 2014-09-25 | Discharge: 2014-09-25 | Disposition: A | Discharge status: Home / Self Care | Payer: Self-pay | Source: Ambulatory Visit | Attending: Oncology | Admitting: Oncology

## 2014-09-25 ENCOUNTER — Other Ambulatory Visit: Payer: Self-pay

## 2014-09-25 ENCOUNTER — Ambulatory Visit: Payer: Self-pay

## 2014-09-25 DIAGNOSIS — N63 Unspecified lump in unspecified breast: Secondary | ICD-10-CM

## 2014-09-25 DIAGNOSIS — R928 Other abnormal and inconclusive findings on diagnostic imaging of breast: Secondary | ICD-10-CM | POA: Insufficient documentation

## 2014-09-25 NOTE — Progress Notes (Unsigned)
Patient called to get her pap smear results.  Informed of normal pap and negative HPV.  Next pap due in 5 years.

## 2014-09-26 ENCOUNTER — Telehealth: Payer: Self-pay

## 2014-09-26 NOTE — Telephone Encounter (Signed)
Phoned patient regarding Bir[facility] 4 mammogram, and ultrasound results.  Radiologist has discussed plans for  tomosynthesis guided biopsy to be performed in [place].  Patient will call [facility] to let report date of biopsy.   Checking with  Mammography  Regarding payment for the tomosynthesis portion of the biopsy, which [facility] does not cover.

## 2014-10-01 ENCOUNTER — Other Ambulatory Visit: Payer: Self-pay

## 2014-10-01 DIAGNOSIS — N63 Unspecified lump in unspecified breast: Secondary | ICD-10-CM

## 2014-10-01 DIAGNOSIS — N6489 Other specified disorders of breast: Secondary | ICD-10-CM

## 2014-10-10 ENCOUNTER — Ambulatory Visit
Admission: RE | Admit: 2014-10-10 | Discharge: 2014-10-10 | Disposition: A | Discharge status: Home / Self Care | Payer: No Typology Code available for payment source | Source: Ambulatory Visit | Attending: Oncology | Admitting: Oncology

## 2014-10-10 DIAGNOSIS — N63 Unspecified lump in unspecified breast: Secondary | ICD-10-CM

## 2014-10-10 DIAGNOSIS — N6489 Other specified disorders of breast: Secondary | ICD-10-CM

## 2014-10-10 HISTORY — PX: BREAST BIOPSY: SHX20

## 2014-10-11 ENCOUNTER — Telehealth: Payer: Self-pay

## 2014-10-11 ENCOUNTER — Telehealth: Payer: Self-pay | Admitting: *Deleted

## 2014-10-11 NOTE — Telephone Encounter (Signed)
Patient reports she received call from [facility] this morning with diagnosis of invasive mammary carcinoma.  Introduced IT trainer to patient.  Scheduled appointment in [place] with Dr. Oliva Bustard on 10/16/14 at 0800.  Instructed patient to bring birth certificate, and driver's license for [facility] application.  Explained genetic testing maybe recommended , so proof of income will be necessary for financial assistance with this test.  Patient states she is doing well, and that she copes with stress using humor, so don't be alarmed. "  Will accompany patient to initial [place] appointment.

## 2014-10-11 NOTE — Telephone Encounter (Signed)
I called [name] and gave her the results, she said they will call patient today

## 2014-10-16 ENCOUNTER — Inpatient Hospital Stay: Payer: Medicaid Other

## 2014-10-16 ENCOUNTER — Inpatient Hospital Stay: Payer: Medicaid Other | Attending: Oncology | Admitting: Oncology

## 2014-10-16 ENCOUNTER — Encounter: Payer: Self-pay | Admitting: Oncology

## 2014-10-16 VITALS — BP 176/109 | HR 67 | Temp 98.0°F | Wt 226.4 lb

## 2014-10-16 DIAGNOSIS — C50211 Malignant neoplasm of upper-inner quadrant of right female breast: Secondary | ICD-10-CM | POA: Insufficient documentation

## 2014-10-16 DIAGNOSIS — Z1371 Encounter for nonprocreative screening for genetic disease carrier status: Secondary | ICD-10-CM

## 2014-10-16 DIAGNOSIS — C50811 Malignant neoplasm of overlapping sites of right female breast: Secondary | ICD-10-CM | POA: Insufficient documentation

## 2014-10-16 DIAGNOSIS — Z8673 Personal history of transient ischemic attack (TIA), and cerebral infarction without residual deficits: Secondary | ICD-10-CM | POA: Diagnosis not present

## 2014-10-16 DIAGNOSIS — Z79899 Other long term (current) drug therapy: Secondary | ICD-10-CM | POA: Diagnosis not present

## 2014-10-16 DIAGNOSIS — C50911 Malignant neoplasm of unspecified site of right female breast: Secondary | ICD-10-CM

## 2014-10-16 HISTORY — DX: Encounter for nonprocreative screening for genetic disease carrier status: Z13.71

## 2014-10-16 HISTORY — DX: Malignant neoplasm of unspecified site of right female breast: C50.911

## 2014-10-16 NOTE — Progress Notes (Signed)
[place] @ [facility] Telephone:([phone]) [phone]  Fax:([phone]) [phone]   INITIAL CONSULT  Elizabeth Flynn OB: 15-Mar-1971  MR#: [id number]  AGT#:[id number]  Patient Care Team: Lavonne Chick, MD as PCP - General (Family Medicine) Rico Junker, RN as Registered Nurse (Radiology) Theodore Demark, RN as Registered Nurse  CHIEF COMPLAINT:  Chief Complaint  Patient presents with  . New Evaluation    VISIT DIAGNOSIS:     ICD-9-CM ICD-10-CM   1. Cancer of right breast 174.9 C50.911      Oncology History   1.  Abnormal mammogram with calcification in the right breast.  Biopsy on October 10, 2014 was positive for invasive carcinoma     Cancer of right breast   10/16/2014 Initial Diagnosis Cancer of right breast    Oncology Flowsheet 11/01/2011 08/12/2014  ondansetron (ZOFRAN) IV 4 mg -  promethazine (PHENERGAN) IV 25 mg -  promethazine (PHENERGAN) PO - 25 mg    INTERVAL HISTORY:  44 year old lady presented to [facility] clinic with history of pelvic pain.  Routine mammogram because patient never had mammogram was ordered and was abnormal in the right breast.  Stereotactic biopsy was positive for invasive cancer patient was referred to me for discussion regarding various options of therapy Extremely apprehensive patient accompanied by friend. Patient's mother who is a Marine scientist lives in [place]: GENERAL:  Feels good.  Active.  No fevers, sweats or weight loss. PERFORMANCE STATUS (ECOG): O HEENT:  No visual changes, runny nose, sore throat, mouth sores or tenderness. Lungs: No shortness of breath or cough.  No hemoptysis. Cardiac:  No chest pain, palpitations, orthopnea, or PND. GI:  No nausea, vomiting, diarrhea, constipation, melena or hematochezia. GU:  No urgency, frequency, dysuria, or hematuria. Musculoskeletal:  No back pain.  No joint pain.  No muscle tenderness. Extremities:  No pain or swelling. Skin:  No rashes or skin changes. Neuro:  No headache, numbness or  weakness, balance or coordination issues. In this September off 2014 patient had bleed in the brain and exact history not available. Diagnosis in [place] Endocrine:  No diabetes, thyroid issues, hot flashes or night sweats. Psych: Patient has a mood disorder and on chronic medication being managed by a nurse practitioner Pain:  No focal pain. Review of systems:  All other systems reviewed and found to be negative. As per HPI. Otherwise, a complete review of systems is negatve.  PAST MEDICAL HISTORY: Past Medical History  Diagnosis Date  . Kidney stones   . Anemia   . Ovarian cyst   . Blood transfusion without reported diagnosis   . Hypertension   . Stroke     Sept. 2014  . Cancer of right breast 10/16/2014    Upper and inner quadrant    PAST SURGICAL HISTORY: Past Surgical History  Procedure Laterality Date  . Cholecystectomy    . Tonsillectomy    . Hernia repair      FAMILY HISTORY Family History  Problem Relation Age of Onset  . Breast cancer Cousin 83   Mother had atypical cells in both breasts and has been removed before age 89 Streaky of lung cancer and stomach cancer in the family GYNECOLOGIC HISTORY: Heavy menstrual cycle every 14 days  ADVANCED DIRECTIVES:Patient does not have any living will or healthcare power of attorney.  Information was given .  Available resources had been discussed.  We will follow-up on subsequent appointments regarding this issue    HEALTH MAINTENANCE: History  Substance  Use Topics  . Smoking status: Former Research scientist (life sciences)  . Smokeless tobacco: Not on file  . Alcohol Use: No      Allergies  Allergen Reactions  . Hydrocodone-Acetaminophen Hives  . Hydrocodone Itching  . Reglan [Metoclopramide] Other (See Comments)    "freaks me out, makes me nervous"  . Clindamycin/Lincomycin Rash  . Morphine And Related Rash    Current Outpatient Prescriptions  Medication Sig Dispense Refill  . acetaminophen (TYLENOL) 500 MG  tablet Take 1,500 mg by mouth every 6 (six) hours as needed. For pain    . ferrous sulfate 325 (65 FE) MG tablet Take 325 mg by mouth daily with breakfast.    . lamoTRIgine (LAMICTAL) 25 MG tablet Take 25 mg by mouth daily.    Marland Kitchen lisinopril (PRINIVIL,ZESTRIL) 10 MG tablet Take 10 mg by mouth.    . vitamin C (ASCORBIC ACID) 500 MG tablet Take 500 mg by mouth daily.    Marland Kitchen oxyCODONE-acetaminophen (ROXICET) 5-325 MG per tablet Take 1 tablet by mouth every 6 (six) hours as needed for severe pain. (Patient not taking: Reported on 10/16/2014) 12 tablet 0  . promethazine (PHENERGAN) 25 MG tablet Take 1 tablet (25 mg total) by mouth every 6 (six) hours as needed for nausea. 30 tablet 0  . promethazine (PHENERGAN) 25 MG tablet Take 1 tablet (25 mg total) by mouth every 6 (six) hours as needed for nausea or vomiting. (Patient not taking: Reported on 10/16/2014) 15 tablet 0  . ranitidine (ZANTAC) 150 MG tablet Take 150 mg by mouth daily.     No current facility-administered medications for this visit.    OBJECTIVE: PHYSICAL EXAM: GENERAL:  Well developed, well nourished, sitting comfortably in the exam room in no acute distress.  Moderately obese lady very apprehensive MENTAL STATUS:  Alert and oriented to person, place and time. HEAD Normocephalic, atraumatic, face symmetric, no Cushingoid features. EYES:  Pupils equal round and reactive to light and accomodation.  No conjunctivitis or scleral icterus. ENT:  Oropharynx clear without lesion.  Tongue normal. Mucous membranes moist.  RESPIRATORY:  Clear to auscultation without rales, wheezes or rhonchi. CARDIOVASCULAR:  Regular rate and rhythm without murmur, rub or gallop. BREAST:  Right breast palpable mass due to recent biopsy in upper and inner quadrant  Left breast without masses, skin changes or nipple discharge.  Similarly lymph nodes are not palpable ABDOMEN:  Soft, non-tender, with active bowel sounds, and no hepatosplenomegaly.  No masses. BACK:  No  CVA tenderness.  No tenderness on percussion of the back or rib cage. SKIN:  No rashes, ulcers or lesions. EXTREMITIES: No edema, no skin discoloration or tenderness.  No palpable cords. LYMPH NODES: No palpable cervical, supraclavicular, axillary or inguinal adenopathy  NEUROLOGICAL: Unremarkable. PSYCH:  Appropriate.  Filed Vitals:   10/16/14 0820  BP: 176/109  Pulse: 67  Temp: 98 F (36.7 C)     Body mass index is 36.56 kg/(m^2).    ECOG FS:0 - Asymptomatic  LAB RESULTS:  No visits with results within 2 Day(s) from this visit. Latest known visit with results is:  Orders Only on 09/12/2014  Component Date Value Ref Range Status  . DIAGNOSIS: 09/12/2014 Comment   Final   Comment: NEGATIVE FOR INTRAEPITHELIAL LESION AND MALIGNANCY. THIS SPECIMEN WAS RESCREENED AS PART OF OUR QUALITY CONTROL PROGRAM.   . Specimen adequacy: 09/12/2014 Comment   Final   Comment: Satisfactory for evaluation. Endocervical and/or squamous metaplastic cells (endocervical component) are present.   Marland Kitchen CLINICIAN PROVIDED ICD10:  09/12/2014 Comment   Final   Z00.00  . Performed by: 09/12/2014 Comment   Final   Terrace Arabia, Cytotechnologist (ASCP)  . QC reviewed by: 09/12/2014 Comment   Final   Marlane Hatcher, Cytotechnologist (ASCP)  . PAP SMEAR COMMENT 09/12/2014 .   Final  . Note: 09/12/2014 Comment   Final   Comment: The Pap smear is a screening test designed to aid in the detection of premalignant and malignant conditions of the uterine cervix.  It is not a diagnostic procedure and should not be used as the sole means of detecting cervical cancer.  Both false-positive and false-negative reports do occur.   . HPV, high-risk 09/12/2014 Negative  Negative Final   Comment: This high-risk HPV test detects thirteen high-risk types (16/18/31/33/35/39/45/51/52/56/58/59/68) without differentiation.      STUDIES: US Breast Ltd Uni Right Inc Axilla  09/25/2014   CLINICAL DATA:  44 year old female,  callback from screening mammogram for possible bilateral breast asymmetries  EXAM: DIGITAL DIAGNOSTIC BILATERAL MAMMOGRAM WITH 3D TOMOSYNTHESIS WITH CAD  ULTRASOUND RIGHT BREAST  COMPARISON:  09/12/2014  ACR Breast Density Category c: The breast tissue is heterogeneously dense, which may obscure small masses.  FINDINGS: Bilateral CC and MLO views and a full lateral view of the right breast with tomosynthesis were performed. The questioned abnormality within the anterior, lateral left breast on screening mammogram does not persist on additional views. No suspicious mass, calcifications, or other abnormality is identified within the left breast.  Within the upper, inner right breast, there is is area of distortion, corresponding to the questioned abnormality on screening mammogram. No associated calcifications are identified. A prominent right axillary lymph node was noted.  Mammographic images were processed with CAD.  On physical exam, no discrete mass is identified in the area of concern within the medial right breast.  Targeted ultrasound is performed, showing no definite sonographic correlate within the medial right breast for the area of distortion seen on screening mammogram. Targeted ultrasound of the right axilla demonstrated no suspicious appearing right axillary lymph nodes.  IMPRESSION: Indeterminate distortion within the medial right breast.  RECOMMENDATION: Tomosynthesis guided right breast biopsy.  I have discussed the findings and recommendations with the patient. Results were also provided in writing at the conclusion of the visit. If applicable, a reminder letter will be sent to the patient regarding the next appointment.  BI-RADS CATEGORY  4: Suspicious.   Electronically Signed   By: Pamelia Hoit M.D.   On: 09/25/2014 17:12   Mm Diag Breast Tomo Uni Right  10/10/2014   CLINICAL DATA:  44 year old female status post stereotactic/ tomosynthesis guided right breast biopsy  EXAM: DIAGNOSTIC RIGHT  MAMMOGRAM POST STEREOTACTIC/TOMOSYNTHESIS GUIDED BIOPSY  COMPARISON:  09/25/2014, 09/12/2014  FINDINGS: Mammographic images were obtained following stereotactic/tomosynthesis guided biopsy of a right breast distortion. Post biopsy mammogram demonstrates an X shaped biopsy marker in the expected location within the upper, inner right breast.  IMPRESSION: Satisfactory marker placement post stereotactic/tomosynthesis guided biopsy.  Final Assessment: Post Procedure Mammograms for Marker Placement   Electronically Signed   By: Pamelia Hoit M.D.   On: 10/10/2014 10:13   Mm Diag Breast Tomo Bilateral  09/25/2014   CLINICAL DATA:  44 year old female, callback from screening mammogram for possible bilateral breast asymmetries  EXAM: DIGITAL DIAGNOSTIC BILATERAL MAMMOGRAM WITH 3D TOMOSYNTHESIS WITH CAD  ULTRASOUND RIGHT BREAST  COMPARISON:  09/12/2014  ACR Breast Density Category c: The breast tissue is heterogeneously dense, which may obscure small masses.  FINDINGS: Bilateral CC  and MLO views and a full lateral view of the right breast with tomosynthesis were performed. The questioned abnormality within the anterior, lateral left breast on screening mammogram does not persist on additional views. No suspicious mass, calcifications, or other abnormality is identified within the left breast.  Within the upper, inner right breast, there is is area of distortion, corresponding to the questioned abnormality on screening mammogram. No associated calcifications are identified. A prominent right axillary lymph node was noted.  Mammographic images were processed with CAD.  On physical exam, no discrete mass is identified in the area of concern within the medial right breast.  Targeted ultrasound is performed, showing no definite sonographic correlate within the medial right breast for the area of distortion seen on screening mammogram. Targeted ultrasound of the right axilla demonstrated no suspicious appearing right axillary  lymph nodes.  IMPRESSION: Indeterminate distortion within the medial right breast.  RECOMMENDATION: Tomosynthesis guided right breast biopsy.  I have discussed the findings and recommendations with the patient. Results were also provided in writing at the conclusion of the visit. If applicable, a reminder letter will be sent to the patient regarding the next appointment.  BI-RADS CATEGORY  4: Suspicious.   Electronically Signed   By: Pamelia Hoit M.D.   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 This was found to be concordant by Dr. Conchita Paris. Pathology was discussed with the patient by telephone. She reported doing well after the biopsy with tenderness and bruising at the site. Post biopsy instructions and care were reviewed and her questions were answered. I called Dr. Metro Kung office and his nurse will arrange surgical referral for the patient. My number was provided to the patient for additional questions and concerns.  Pathology results reported by Susa Raring RN, BSN on October 12, 2014.   Electronically Signed   By: Pamelia Hoit M.D.   On: 10/12/2014 09:40   10/15/2014   CLINICAL DATA:  44 year old female for biopsy of a right breast distortion  EXAM: RIGHT BREAST STEREOTACTIC/TOMOSYNTHESIS GUIDED CORE NEEDLE BIOPSY  COMPARISON:  Previous exams.  FINDINGS: The patient and I discussed the procedure of stereotactic-guided biopsy including benefits and alternatives. We discussed the high likelihood of a successful procedure. We discussed the risks of the procedure including infection, bleeding, tissue injury, clip migration, and inadequate sampling. Informed written consent was given. The usual time out protocol was performed immediately prior to the procedure.  Using sterile technique and 2% Lidocaine  as local anesthetic, under stereotactic/tomosynthesis guidance, a 9 gauge vacuum assist device was used to perform core needle biopsy of a distortion in the upper, inner quadrant of the right breast using a superior to inferior approach.  At the conclusion of the procedure, an X shaped tissue marker clip was deployed into the biopsy cavity. Follow-up 2-view mammogram was performed and dictated separately.  IMPRESSION: Stereotactic-guided biopsy of a right breast distortion. No apparent complications.  Electronically Signed: By: Pamelia Hoit M.D. On: 10/10/2014 10:14    ASSESSMENT:  Carcinoma of breast based on ultrasound and mammogram appears to be early stage tumor Invasive cancer Estrogen progesterone and HER-2 receptor not available at present time [place] of DCIS present Mammogram and ultrasound has been reviewed and case was discussed in breast cancer conference  PLAN:  Detailed discussion regarding various options for local therapy including lumpectomy mastectomy patient and number of questions regarding reconstructive surgery  Discussion regarding systemic therapy Considering patient's age mild risk study is being sent Patient would get surgical opinion She may need gynecological evaluation regarding heavy menstrual cycle  Patient expressed understanding and was in agreement with this plan. She also understands that She can call clinic at any time with any questions, concerns, or complaints.    No matching staging information was found for the patient.  Forest Gleason, MD   10/16/2014 9:00 AM

## 2014-10-16 NOTE — Progress Notes (Signed)
Patient does not have living will.  Information given.  Former smoker.

## 2014-10-16 NOTE — Progress Notes (Signed)
Met with patient and friend Arbie Cookey at initial Medical Oncology visit. Scheduled for surgical consult with Dr. Jamal Collin on 10/18/14 at 3:00.  Introduced to [employer].  MYRISK test sent.  BCCM application completed.

## 2014-10-18 ENCOUNTER — Other Ambulatory Visit
Admission: RE | Admit: 2014-10-18 | Discharge: 2014-10-18 | Disposition: A | Discharge status: Home / Self Care | Payer: Medicaid Other | Source: Ambulatory Visit | Attending: General Surgery | Admitting: General Surgery

## 2014-10-18 ENCOUNTER — Ambulatory Visit: Payer: Self-pay | Admitting: General Surgery

## 2014-10-18 ENCOUNTER — Ambulatory Visit (INDEPENDENT_AMBULATORY_CARE_PROVIDER_SITE_OTHER): Payer: Medicaid Other | Admitting: General Surgery

## 2014-10-18 ENCOUNTER — Encounter: Payer: Self-pay | Admitting: General Surgery

## 2014-10-18 DIAGNOSIS — C50211 Malignant neoplasm of upper-inner quadrant of right female breast: Secondary | ICD-10-CM

## 2014-10-18 LAB — CBC WITH DIFFERENTIAL/PLATELET
BASOS PCT: 1 %
Basophils Absolute: 0.1 10*3/uL (ref 0–0.1)
Eosinophils Absolute: 0.1 10*3/uL (ref 0–0.7)
Eosinophils Relative: 1 %
HEMATOCRIT: 29.9 % — AB (ref 35.0–47.0)
Hemoglobin: 9 g/dL — ABNORMAL LOW (ref 12.0–16.0)
Lymphocytes Relative: 27 %
Lymphs Abs: 2.5 10*3/uL (ref 1.0–3.6)
MCH: 21 pg — ABNORMAL LOW (ref 26.0–34.0)
MCHC: 30 g/dL — ABNORMAL LOW (ref 32.0–36.0)
MCV: 69.9 fL — ABNORMAL LOW (ref 80.0–100.0)
MONO ABS: 0.6 10*3/uL (ref 0.2–0.9)
Monocytes Relative: 6 %
NEUTROS ABS: 5.9 10*3/uL (ref 1.4–6.5)
Neutrophils Relative %: 65 %
PLATELETS: 392 10*3/uL (ref 150–440)
RBC: 4.28 MIL/uL (ref 3.80–5.20)
RDW: 17.2 % — ABNORMAL HIGH (ref 11.5–14.5)
WBC: 9.1 10*3/uL (ref 3.6–11.0)

## 2014-10-18 LAB — HEPATIC FUNCTION PANEL
ALT: 13 U/L — AB (ref 14–54)
AST: 19 U/L (ref 15–41)
Albumin: 4.2 g/dL (ref 3.5–5.0)
Alkaline Phosphatase: 50 U/L (ref 38–126)
BILIRUBIN TOTAL: 0.5 mg/dL (ref 0.3–1.2)
Bilirubin, Direct: <0.1 mg/dL — ABNORMAL LOW (ref 0.1–0.5)
TOTAL PROTEIN: 7.2 g/dL (ref 6.5–8.1)

## 2014-10-18 NOTE — Patient Instructions (Addendum)
Lumpectomy A lumpectomy is a form of "breast conserving" or "breast preservation" surgery. It may also be referred to as a partial mastectomy. During a lumpectomy, the portion of the breast that contains the cancerous tumor or breast mass (the lump) is removed. Some normal tissue around the lump may also be removed to make sure all of the tumor has been removed.  LET YOUR HEALTH CARE PROVIDER KNOW ABOUT:  Any allergies you have.  All medicines you are taking, including vitamins, herbs, eye drops, creams, and over-the-counter medicines.  Previous problems you or members of your family have had with the use of anesthetics.  Any blood disorders you have.  Previous surgeries you have had.  Medical conditions you have. RISKS AND COMPLICATIONS Generally, this is a safe procedure. However, problems can occur and include:  Bleeding.  Infection.  Pain.  Temporary swelling.  Change in the shape of the breast, particularly if a large portion is removed. BEFORE THE PROCEDURE  Ask your health care provider about changing or stopping your regular medicines. This is especially important if you are taking diabetes medicines or blood thinners.  Do not eat or drink anything after midnight on the night before the procedure or as directed by your health care provider. Ask your health care provider if you can take a sip of water with any approved medicines.  On the day of surgery, your health care provider will use a mammogram or ultrasound to locate and mark the tumor in your breast. These markings on your breast will show where the cut (incision) will be made. PROCEDURE   An IV tube will be put into one of your veins.  You may be given medicine to help you relax before the surgery (sedative). You will be given one of the following:  A medicine that numbs the area (local anesthetic).  A medicine that makes you fall asleep (general anesthetic).  Your health care provider will use a kind of  electric scalpel that uses heat to minimize bleeding (electrocautery knife).  A curved incision (like a smile or frown) that follows the natural curve of your breast is made, to allow for minimal scarring and better healing.  The tumor will be removed with some of the surrounding tissue. This will be sent to the lab for analysis. Your health care provider may also remove your lymph nodes at this time if needed.  Sometimes, but not always, a rubber tube called a drain will be surgically inserted into your breast area or armpit to collect excess fluid that may accumulate in the space where the tumor was. This drain is connected to a plastic bulb on the outside of your body. This drain creates suction to help remove the fluid.  The incisions will be closed with stitches (sutures).  A bandage may be placed over the incisions. AFTER THE PROCEDURE  You will be taken to the recovery area.  You will be given medicine for pain.  A small rubber drain may be placed in the breast for 2-3 days to prevent a collection of blood (hematoma) from developing in the breast. You will be given instructions on caring for the drain before you go home.  A pressure bandage (dressing) will be applied for 1-2 days to prevent bleeding. Ask your health care provider how to care for your bandage at home. Document Released: 05/04/2006 Document Revised: 08/07/2013 Document Reviewed: 08/26/2012 ExitCare Patient Information 2015 [employer]. This information is not intended to replace advice given to you by   your health care provider. Make sure you discuss any questions you have with your health care provider.  Patient's surgery has been scheduled for 10-23-14 at [facility]. It is okay for patient to continue 81 mg aspirin once daily.

## 2014-10-18 NOTE — Progress Notes (Signed)
Patient ID: Elizabeth Flynn, female   DOB: 08-Nov-1970, 44 y.o.   MRN: [id number]  Chief Complaint  Patient presents with  . Other    right breast cancer    HPI Elizabeth Flynn is a 44 y.o. female here for evaluation of right breast cancer. She had her most recent mammogram on 09/12/14 with added views and ultrasound on 09/25/14. She had a right breast biopsy done on 10/10/14 showing Invasive Ductal Carcinoma, Atypical Ductal Hyperplasia, ER/PR Pos, Her 2 Neg. Patient saw Dr. Jeb Levering on Tuesday and they performed the genetic test. HPI  Past Medical History  Diagnosis Date  . Kidney stones   . Anemia   . Ovarian cyst   . Blood transfusion without reported diagnosis   . Hypertension   . Stroke     Sept. 2014  . Cancer of right breast 10/16/2014    Upper and inner quadrant right breast, Invasive ductal carcinoma, Atypical Ductal Hyperplasia, ER/PR Pos, Her 2 Neg    Past Surgical History  Procedure Laterality Date  . Cholecystectomy    . Tonsillectomy    . Hernia repair    . Breast biopsy Right 10/10/2014    Family History  Problem Relation Age of Onset  . Breast cancer Cousin 70    Social History History  Substance Use Topics  . Smoking status: Former Research scientist (life sciences)  . Smokeless tobacco: Not on file  . Alcohol Use: No    Allergies  Allergen Reactions  . Hydrocodone-Acetaminophen Hives  . Hydrocodone Itching  . Reglan [Metoclopramide] Other (See Comments)    "freaks me out, makes me nervous"  . Clindamycin/Lincomycin Rash  . Morphine And Related Rash    Current Outpatient Prescriptions  Medication Sig Dispense Refill  . acetaminophen (TYLENOL) 500 MG tablet Take 1,500 mg by mouth every 6 (six) hours as needed. For pain    . lamoTRIgine (LAMICTAL) 25 MG tablet Take 25 mg by mouth daily.    Marland Kitchen lisinopril (PRINIVIL,ZESTRIL) 10 MG tablet Take 10 mg by mouth.    . vitamin C (ASCORBIC ACID) 500 MG tablet Take 500 mg by mouth daily.    . promethazine (PHENERGAN) 25 MG tablet Take  1 tablet (25 mg total) by mouth every 6 (six) hours as needed for nausea. 30 tablet 0   No current facility-administered medications for this visit.    Review of Systems Review of Systems  Constitutional: Negative.   Respiratory: Negative.   Cardiovascular: Negative.     Blood pressure 150/72, pulse 78, resp. rate 14, height 5\' 6"  (1.676 m), weight 227 lb (102.967 kg), last menstrual period 10/17/2014.  Physical Exam Physical Exam  Constitutional: She is oriented to person, place, and time. She appears well-developed and well-nourished.  Eyes: Conjunctivae are normal.  Neck: Neck supple.  Cardiovascular: Normal rate, regular rhythm and normal heart sounds.   Pulmonary/Chest: Effort normal and breath sounds normal. Right breast exhibits no inverted nipple, no mass, no nipple discharge, no skin change and no tenderness. Left breast exhibits no inverted nipple, no mass, no nipple discharge, no skin change and no tenderness.    Abdominal: Soft. Normal appearance and bowel sounds are normal. There is no hepatomegaly. There is no tenderness.  Lymphadenopathy:    She has no cervical adenopathy.    She has no axillary adenopathy.  Neurological: She is alert and oriented to person, place, and time.  Skin: Skin is warm and dry.    Data Reviewed Mamograms, pathology report  Assessment    Invasive ductal  carcinoma, grade 1, ER/PR Pos, Her 2 Neg    Plan   All options of management discussed.  Pt is eager to proceed with lumpectomy and SN biopsy. Procedure explained in full to pt.Genetic test pending but pt is not iclined at present for any radical preventive measures if test is positiver. Patient's surgery has been scheduled for 10-23-14 at [facility]. It is okay for patient to continue 81 mg aspirin once daily.      PCP:  Burnett Harry G 10/18/2014, 1:33 PM

## 2014-10-22 ENCOUNTER — Encounter: Payer: Self-pay | Admitting: *Deleted

## 2014-10-22 ENCOUNTER — Inpatient Hospital Stay: Admission: RE | Admit: 2014-10-22 | Payer: No Typology Code available for payment source | Source: Ambulatory Visit

## 2014-10-22 NOTE — Patient Instructions (Signed)
  Your procedure is scheduled on: 10-23-14 Report to [facility] @ 7:30 AM.   Remember: Instructions that are not followed completely may result in serious medical risk, up to and including death, or upon the discretion of your surgeon and anesthesiologist your surgery may need to be rescheduled.    _X___ 1. Do not eat food or drink liquids after midnight. No gum chewing or hard candies.     _X___ 2. No Alcohol for 24 hours before or after surgery.   ____ 3. Bring all medications with you on the day of surgery if instructed.    ____ 4. Notify your doctor if there is any change in your medical condition     (cold, fever, infections).     Do not wear jewelry, make-up, hairpins, clips or nail polish.  Do not wear lotions, powders, or perfumes. You may wear deodorant.  Do not shave 48 hours prior to surgery. Men may shave face and neck.  Do not bring valuables to the hospital.    [facility] is not responsible for any belongings or valuables.               Contacts, dentures or bridgework may not be worn into surgery.  Leave your suitcase in the car. After surgery it may be brought to your room.  For patients admitted to the hospital, discharge time is determined by your treatment team.   Patients discharged the day of surgery will not be allowed to drive home.   Please read over the following fact sheets that you were given:    ____ Take these medicines the morning of surgery with A SIP OF WATER:    1. MAY TAKE ATIVAN IF NEEDED WITH A SMALL SIP OF WATER  2.   3.   4.  5.  6.  ____ Fleet Enema (as directed)   ____ Use CHG Soap as directed  ____ Use inhalers on the day of surgery  ____ Stop metformin 2 days prior to surgery    ____ Take 1/2 of usual insulin dose the night before surgery and none on the morning of surgery.   ____ Stop Coumadin/Plavix/aspirin-PT STOPPED ASA ON 10-10-14  ____ Stop Anti-inflammatories-NO NSAIDS OR ASA PRODUCTS-TYLENOL  OK   _X___ Stop supplements until after surgery-STOP VITAMIN C NOW  ____ Bring C-Pap to the hospital.

## 2014-10-23 ENCOUNTER — Ambulatory Visit
Admission: RE | Admit: 2014-10-23 | Discharge: 2014-10-23 | Disposition: A | Discharge status: Home / Self Care | Payer: Medicaid Other | Source: Ambulatory Visit | Attending: General Surgery | Admitting: General Surgery

## 2014-10-23 ENCOUNTER — Ambulatory Visit: Payer: Medicaid Other | Admitting: Anesthesiology

## 2014-10-23 ENCOUNTER — Encounter: Payer: Self-pay | Admitting: *Deleted

## 2014-10-23 ENCOUNTER — Ambulatory Visit: Admission: RE | Admit: 2014-10-23 | Payer: No Typology Code available for payment source | Source: Ambulatory Visit

## 2014-10-23 ENCOUNTER — Ambulatory Visit: Payer: Medicaid Other

## 2014-10-23 ENCOUNTER — Encounter
Admission: RE | Disposition: A | Discharge status: Home / Self Care | Payer: Self-pay | Source: Ambulatory Visit | Attending: General Surgery

## 2014-10-23 DIAGNOSIS — C50211 Malignant neoplasm of upper-inner quadrant of right female breast: Secondary | ICD-10-CM | POA: Diagnosis not present

## 2014-10-23 DIAGNOSIS — Z87442 Personal history of urinary calculi: Secondary | ICD-10-CM | POA: Diagnosis not present

## 2014-10-23 DIAGNOSIS — Z803 Family history of malignant neoplasm of breast: Secondary | ICD-10-CM | POA: Diagnosis not present

## 2014-10-23 DIAGNOSIS — D649 Anemia, unspecified: Secondary | ICD-10-CM | POA: Insufficient documentation

## 2014-10-23 DIAGNOSIS — F419 Anxiety disorder, unspecified: Secondary | ICD-10-CM | POA: Diagnosis not present

## 2014-10-23 DIAGNOSIS — Z888 Allergy status to other drugs, medicaments and biological substances status: Secondary | ICD-10-CM | POA: Diagnosis not present

## 2014-10-23 DIAGNOSIS — Z79899 Other long term (current) drug therapy: Secondary | ICD-10-CM | POA: Diagnosis not present

## 2014-10-23 DIAGNOSIS — Z170 Estrogen receptor positive status [ER+]: Secondary | ICD-10-CM | POA: Insufficient documentation

## 2014-10-23 DIAGNOSIS — Z885 Allergy status to narcotic agent status: Secondary | ICD-10-CM | POA: Insufficient documentation

## 2014-10-23 DIAGNOSIS — F329 Major depressive disorder, single episode, unspecified: Secondary | ICD-10-CM | POA: Diagnosis not present

## 2014-10-23 DIAGNOSIS — Z87891 Personal history of nicotine dependence: Secondary | ICD-10-CM | POA: Diagnosis not present

## 2014-10-23 DIAGNOSIS — C50911 Malignant neoplasm of unspecified site of right female breast: Secondary | ICD-10-CM | POA: Diagnosis present

## 2014-10-23 DIAGNOSIS — Z8673 Personal history of transient ischemic attack (TIA), and cerebral infarction without residual deficits: Secondary | ICD-10-CM | POA: Diagnosis not present

## 2014-10-23 DIAGNOSIS — Z9049 Acquired absence of other specified parts of digestive tract: Secondary | ICD-10-CM | POA: Diagnosis not present

## 2014-10-23 DIAGNOSIS — R51 Headache: Secondary | ICD-10-CM | POA: Diagnosis not present

## 2014-10-23 DIAGNOSIS — Z886 Allergy status to analgesic agent status: Secondary | ICD-10-CM | POA: Insufficient documentation

## 2014-10-23 HISTORY — DX: Headache: R51

## 2014-10-23 HISTORY — DX: Methicillin resistant Staphylococcus aureus infection, unspecified site: A49.02

## 2014-10-23 HISTORY — DX: Headache, unspecified: R51.9

## 2014-10-23 HISTORY — DX: Cardiac arrhythmia, unspecified: I49.9

## 2014-10-23 HISTORY — DX: Cardiomegaly: I51.7

## 2014-10-23 HISTORY — PX: BREAST LUMPECTOMY WITH SENTINEL LYMPH NODE BIOPSY: SHX5597

## 2014-10-23 HISTORY — DX: Anxiety disorder, unspecified: F41.9

## 2014-10-23 LAB — COMPREHENSIVE METABOLIC PANEL
ALBUMIN: 4 g/dL (ref 3.5–5.0)
ALK PHOS: 56 U/L (ref 38–126)
ALT: 11 U/L — AB (ref 14–54)
AST: 19 U/L (ref 15–41)
Anion gap: 7 (ref 5–15)
BILIRUBIN TOTAL: 0.7 mg/dL (ref 0.3–1.2)
BUN: 15 mg/dL (ref 6–20)
CALCIUM: 8.8 mg/dL — AB (ref 8.9–10.3)
CHLORIDE: 105 mmol/L (ref 101–111)
CO2: 25 mmol/L (ref 22–32)
CREATININE: 0.69 mg/dL (ref 0.44–1.00)
GFR calc non Af Amer: >60 mL/min (ref >60)
Glucose, Bld: 95 mg/dL (ref 65–99)
Potassium: 3.8 mmol/L (ref 3.5–5.1)
Sodium: 137 mmol/L (ref 135–145)
TOTAL PROTEIN: 6.8 g/dL (ref 6.5–8.1)

## 2014-10-23 LAB — POCT PREGNANCY, URINE: PREG TEST UR: NEGATIVE

## 2014-10-23 SURGERY — BREAST LUMPECTOMY WITH SENTINEL LYMPH NODE BX
Anesthesia: General | Laterality: Right | Wound class: Clean

## 2014-10-23 MED ORDER — SODIUM CHLORIDE 0.9 % IJ SOLN
INTRAMUSCULAR | Status: AC
  Filled 2014-10-23: qty 10

## 2014-10-23 MED ORDER — FENTANYL CITRATE (PF) 100 MCG/2ML IJ SOLN
INTRAMUSCULAR | Status: DC | PRN
  Administered 2014-10-23 (×5): 50 ug via INTRAVENOUS

## 2014-10-23 MED ORDER — PROMETHAZINE HCL 25 MG/ML IJ SOLN
INTRAMUSCULAR | Status: AC
  Administered 2014-10-23: 12.5 mg via INTRAVENOUS
  Filled 2014-10-23: qty 1

## 2014-10-23 MED ORDER — MIDAZOLAM HCL 2 MG/2ML IJ SOLN
INTRAMUSCULAR | Status: DC | PRN
  Administered 2014-10-23: 2 mg via INTRAVENOUS

## 2014-10-23 MED ORDER — BUPIVACAINE HCL (PF) 0.5 % IJ SOLN
INTRAMUSCULAR | Status: DC | PRN
  Administered 2014-10-23: 20 mL

## 2014-10-23 MED ORDER — PROPOFOL 10 MG/ML IV BOLUS
INTRAVENOUS | Status: DC | PRN
  Administered 2014-10-23: 200 mg via INTRAVENOUS

## 2014-10-23 MED ORDER — FENTANYL CITRATE (PF) 100 MCG/2ML IJ SOLN
INTRAMUSCULAR | Status: AC
  Administered 2014-10-23: 25 ug via INTRAVENOUS
  Filled 2014-10-23: qty 2

## 2014-10-23 MED ORDER — PROMETHAZINE HCL 25 MG/ML IJ SOLN
6.2500 mg | INTRAMUSCULAR | Status: AC | PRN
  Administered 2014-10-23 (×2): 12.5 mg via INTRAVENOUS

## 2014-10-23 MED ORDER — ACETAMINOPHEN 10 MG/ML IV SOLN
INTRAVENOUS | Status: AC
  Filled 2014-10-23: qty 100

## 2014-10-23 MED ORDER — FENTANYL CITRATE (PF) 100 MCG/2ML IJ SOLN
25.0000 ug | INTRAMUSCULAR | Status: DC | PRN
  Administered 2014-10-23 (×4): 25 ug via INTRAVENOUS

## 2014-10-23 MED ORDER — METHYLENE BLUE 1 % INJ SOLN
INTRAMUSCULAR | Status: AC
  Filled 2014-10-23: qty 10

## 2014-10-23 MED ORDER — OXYCODONE HCL 5 MG PO TABS
5.0000 mg | ORAL_TABLET | Freq: Once | ORAL | Status: DC | PRN
Start: 2014-10-23 — End: 2014-10-23

## 2014-10-23 MED ORDER — CEFAZOLIN SODIUM-DEXTROSE 2-3 GM-% IV SOLR
2.0000 g | INTRAVENOUS | Status: AC
  Administered 2014-10-23: 2 g via INTRAVENOUS

## 2014-10-23 MED ORDER — LACTATED RINGERS IV SOLN
INTRAVENOUS | Status: DC
  Administered 2014-10-23: 09:00:00 via INTRAVENOUS

## 2014-10-23 MED ORDER — CEFAZOLIN SODIUM-DEXTROSE 2-3 GM-% IV SOLR
INTRAVENOUS | Status: AC
  Administered 2014-10-23: 2 g via INTRAVENOUS
  Filled 2014-10-23: qty 50

## 2014-10-23 MED ORDER — TECHNETIUM TC 99M SULFUR COLLOID
0.9680 | Freq: Once | INTRAVENOUS | Status: AC | PRN
  Administered 2014-10-23: 0.968 via INTRAVENOUS

## 2014-10-23 MED ORDER — TRAMADOL HCL 50 MG PO TABS: 50.0000 mg | ORAL_TABLET | Freq: Four times a day (QID) | ORAL | Status: DC | PRN

## 2014-10-23 MED ORDER — ACETAMINOPHEN 10 MG/ML IV SOLN
INTRAVENOUS | Status: DC | PRN
  Administered 2014-10-23: 1000 mg via INTRAVENOUS

## 2014-10-23 MED ORDER — TECHNETIUM TC 99M SULFUR COLLOID: 0.9680 | Freq: Once | INTRAVENOUS | Status: DC | PRN

## 2014-10-23 MED ORDER — OXYCODONE HCL 5 MG/5ML PO SOLN: 5.0000 mg | Freq: Once | ORAL | Status: DC | PRN

## 2014-10-23 MED ORDER — FAMOTIDINE 20 MG PO TABS
20.0000 mg | ORAL_TABLET | Freq: Once | ORAL | Status: AC
  Administered 2014-10-23: 20 mg via ORAL

## 2014-10-23 MED ORDER — BUPIVACAINE HCL (PF) 0.5 % IJ SOLN
INTRAMUSCULAR | Status: AC
  Filled 2014-10-23: qty 30

## 2014-10-23 MED ORDER — ONDANSETRON HCL 4 MG/2ML IJ SOLN
INTRAMUSCULAR | Status: DC | PRN
  Administered 2014-10-23: 4 mg via INTRAVENOUS

## 2014-10-23 MED ORDER — FAMOTIDINE 20 MG PO TABS
ORAL_TABLET | ORAL | Status: AC
  Administered 2014-10-23: 20 mg via ORAL
  Filled 2014-10-23: qty 1

## 2014-10-23 SURGICAL SUPPLY — 34 items
BLADE SURG 15 STRL SS SAFETY (BLADE) ×3 IMPLANT
BULB RESERV EVAC DRAIN JP 100C (MISCELLANEOUS) IMPLANT
CANISTER SUCT 1200ML W/VALVE (MISCELLANEOUS) ×3 IMPLANT
CHLORAPREP W/TINT 26ML (MISCELLANEOUS) ×3 IMPLANT
CLOSURE WOUND 1/2 X4 (GAUZE/BANDAGES/DRESSINGS)
CNTNR SPEC 2.5X3XGRAD LEK (MISCELLANEOUS) ×3
CONT SPEC 4OZ STER OR WHT (MISCELLANEOUS) ×6
CONTAINER SPEC 2.5X3XGRAD LEK (MISCELLANEOUS) ×3 IMPLANT
COVER PROBE FLX POLY STRL (MISCELLANEOUS) ×3 IMPLANT
DEVICE DUBIN SPECIMEN MAMMOGRA (MISCELLANEOUS) ×3 IMPLANT
DEVICE LOCALIZATION ULTRAWIRE (WIRE) ×1 IMPLANT
DRAIN CHANNEL JP 15F RND 16 (MISCELLANEOUS) IMPLANT
DRAPE LAPAROTOMY TRNSV 106X77 (MISCELLANEOUS) ×3 IMPLANT
GLOVE BIO SURGEON STRL SZ7 (GLOVE) ×15 IMPLANT
GOWN STRL REUS W/ TWL LRG LVL3 (GOWN DISPOSABLE) ×3 IMPLANT
GOWN STRL REUS W/TWL LRG LVL3 (GOWN DISPOSABLE) ×6
HARMONIC SCALPEL FOCUS (MISCELLANEOUS) IMPLANT
KIT RM TURNOVER STRD PROC AR (KITS) ×3 IMPLANT
LABEL OR SOLS (LABEL) ×3 IMPLANT
LIQUID BAND (GAUZE/BANDAGES/DRESSINGS) ×3 IMPLANT
MARGIN MAP 10MM (MISCELLANEOUS) ×3 IMPLANT
NDL SAFETY 22GX1.5 (NEEDLE) ×3 IMPLANT
NDL SAFETY 25GX1.5 (NEEDLE) IMPLANT
PACK BASIN MINOR ARMC (MISCELLANEOUS) ×3 IMPLANT
PAD GROUND ADULT SPLIT (MISCELLANEOUS) ×3 IMPLANT
SLEVE PROBE SENORX GAMMA FIND (MISCELLANEOUS) ×3 IMPLANT
STRIP CLOSURE SKIN 1/2X4 (GAUZE/BANDAGES/DRESSINGS) IMPLANT
SUT ETH BLK MONO 3 0 FS 1 12/B (SUTURE) ×3 IMPLANT
SUT MNCRL AB 3-0 PS2 27 (SUTURE) ×3 IMPLANT
SUT VIC AB 2-0 BRD 54 (SUTURE) ×3 IMPLANT
SUT VIC AB 2-0 CT2 27 (SUTURE) ×6 IMPLANT
SYRINGE 10CC LL (SYRINGE) ×3 IMPLANT
ULTRAWIRE LOCALIZATION DEVICE (WIRE) ×3
WATER STERILE IRR 1000ML POUR (IV SOLUTION) ×3 IMPLANT

## 2014-10-23 NOTE — Anesthesia Procedure Notes (Signed)
Procedure Name: LMA Insertion Date/Time: 10/23/2014 9:31 AM Performed by: Jonna Clark Pre-anesthesia Checklist: Patient identified, Patient being monitored, Timeout performed, Emergency Drugs available and Suction available Patient Re-evaluated:Patient Re-evaluated prior to inductionOxygen Delivery Method: Circle system utilized Preoxygenation: Pre-oxygenation with 100% oxygen Intubation Type: IV induction Ventilation: Mask ventilation without difficulty LMA: LMA inserted LMA Size: 3.5 Tube type: Oral Number of attempts: 1 Placement Confirmation: positive ETCO2 and breath sounds checked- equal and bilateral Tube secured with: Tape Dental Injury: Teeth and Oropharynx as per pre-operative assessment

## 2014-10-23 NOTE — Anesthesia Postprocedure Evaluation (Signed)
  Anesthesia Post-op Note  Patient: Elizabeth Flynn  Procedure(s) Performed: Procedure(s): Right breast lumpectomy with sentinel node biopsy  (Right)  Anesthesia type:General LMA  Patient location: PACU  Post pain: Pain level controlled  Post assessment: Post-op Vital signs reviewed, Patient's Cardiovascular Status Stable, Respiratory Function Stable, Patent Airway and No signs of Nausea or vomiting  Post vital signs: Reviewed and stable  Last Vitals:  Filed Vitals:   10/23/14 1326  BP: 157/69  Pulse: 70  Temp: 36.5 C  Resp: 16    Level of consciousness: awake, alert  and patient cooperative  Complications: No apparent anesthesia complications

## 2014-10-23 NOTE — Anesthesia Preprocedure Evaluation (Signed)
Anesthesia Evaluation  Patient identified by MRN, date of birth, ID band Patient awake    Reviewed: Allergy & Precautions, H&P , NPO status , Patient's Chart, lab work & pertinent test results, reviewed documented beta blocker date and time   History of Anesthesia Complications Negative for: history of anesthetic complications  Airway Mallampati: III  TM Distance: >3 FB Neck ROM: full    Dental no notable dental hx. (+) Teeth Intact, Caps   Pulmonary former smoker,  breath sounds clear to auscultation  + decreased breath sounds      Cardiovascular Exercise Tolerance: Good hypertension, Normal cardiovascular exam+ dysrhythmias Rhythm:regular Rate:Normal  Patient reports tachycardia which self resolves   Neuro/Psych  Headaches, PSYCHIATRIC DISORDERS Anxiety Depression CVA, No Residual Symptoms    GI/Hepatic negative GI ROS, Neg liver ROS,   Endo/Other  negative endocrine ROS  Renal/GU Renal disease  negative genitourinary   Musculoskeletal negative musculoskeletal ROS (+)   Abdominal   Peds negative pediatric ROS (+)  Hematology negative hematology ROS (+)   Anesthesia Other Findings Past Medical History:   Kidney stones                                                Anemia                                                       Ovarian cyst                                                 Blood transfusion without reported diagnosis                 Hypertension                                                 Stroke                                                         Comment:Sept. 2014   Cancer of right breast                          10/16/2014      Comment:Upper and inner quadrant right breast, Invasive              ductal carcinoma, Atypical Ductal Hyperplasia,               ER/PR Pos, Her 2 Neg   Anxiety  Headache                                                      MRSA infection                                  2009         Dysrhythmia                                                    Comment:TACHYCARDIA   Enlarged heart                                               Reproductive/Obstetrics negative OB ROS                             Anesthesia Physical Anesthesia Plan  ASA: III  Anesthesia Plan: General LMA   Post-op Pain Management:    Induction:   Airway Management Planned:   Additional Equipment:   Intra-op Plan:   Post-operative Plan:   Informed Consent: I have reviewed the patients History and Physical, chart, labs and discussed the procedure including the risks, benefits and alternatives for the proposed anesthesia with the patient or authorized representative who has indicated his/her understanding and acceptance.   Dental Advisory Given  Plan Discussed with: Anesthesiologist, CRNA and Surgeon  Anesthesia Plan Comments:         Anesthesia Quick Evaluation

## 2014-10-23 NOTE — H&P (View-Only) (Signed)
Patient ID: Elizabeth Flynn, female   DOB: 1970-08-03, 44 y.o.   MRN: [id number]  Chief Complaint  Patient presents with  . Other    right breast cancer    HPI Elizabeth Flynn is a 44 y.o. female here for evaluation of right breast cancer. She had her most recent mammogram on 09/12/14 with added views and ultrasound on 09/25/14. She had a right breast biopsy done on 10/10/14 showing Invasive Ductal Carcinoma, Atypical Ductal Hyperplasia, ER/PR Pos, Her 2 Neg. Patient saw Dr. Jeb Levering on Tuesday and they performed the genetic test. HPI  Past Medical History  Diagnosis Date  . Kidney stones   . Anemia   . Ovarian cyst   . Blood transfusion without reported diagnosis   . Hypertension   . Stroke     Sept. 2014  . Cancer of right breast 10/16/2014    Upper and inner quadrant right breast, Invasive ductal carcinoma, Atypical Ductal Hyperplasia, ER/PR Pos, Her 2 Neg    Past Surgical History  Procedure Laterality Date  . Cholecystectomy    . Tonsillectomy    . Hernia repair    . Breast biopsy Right 10/10/2014    Family History  Problem Relation Age of Onset  . Breast cancer Cousin 30    Social History History  Substance Use Topics  . Smoking status: Former Research scientist (life sciences)  . Smokeless tobacco: Not on file  . Alcohol Use: No    Allergies  Allergen Reactions  . Hydrocodone-Acetaminophen Hives  . Hydrocodone Itching  . Reglan [Metoclopramide] Other (See Comments)    "freaks me out, makes me nervous"  . Clindamycin/Lincomycin Rash  . Morphine And Related Rash    Current Outpatient Prescriptions  Medication Sig Dispense Refill  . acetaminophen (TYLENOL) 500 MG tablet Take 1,500 mg by mouth every 6 (six) hours as needed. For pain    . lamoTRIgine (LAMICTAL) 25 MG tablet Take 25 mg by mouth daily.    Marland Kitchen lisinopril (PRINIVIL,ZESTRIL) 10 MG tablet Take 10 mg by mouth.    . vitamin C (ASCORBIC ACID) 500 MG tablet Take 500 mg by mouth daily.    . promethazine (PHENERGAN) 25 MG tablet Take  1 tablet (25 mg total) by mouth every 6 (six) hours as needed for nausea. 30 tablet 0   No current facility-administered medications for this visit.    Review of Systems Review of Systems  Constitutional: Negative.   Respiratory: Negative.   Cardiovascular: Negative.     Blood pressure 150/72, pulse 78, resp. rate 14, height 5\' 6"  (1.676 m), weight 227 lb (102.967 kg), last menstrual period 10/17/2014.  Physical Exam Physical Exam  Constitutional: She is oriented to person, place, and time. She appears well-developed and well-nourished.  Eyes: Conjunctivae are normal.  Neck: Neck supple.  Cardiovascular: Normal rate, regular rhythm and normal heart sounds.   Pulmonary/Chest: Effort normal and breath sounds normal. Right breast exhibits no inverted nipple, no mass, no nipple discharge, no skin change and no tenderness. Left breast exhibits no inverted nipple, no mass, no nipple discharge, no skin change and no tenderness.    Abdominal: Soft. Normal appearance and bowel sounds are normal. There is no hepatomegaly. There is no tenderness.  Lymphadenopathy:    She has no cervical adenopathy.    She has no axillary adenopathy.  Neurological: She is alert and oriented to person, place, and time.  Skin: Skin is warm and dry.    Data Reviewed Mamograms, pathology report  Assessment    Invasive ductal  carcinoma, grade 1, ER/PR Pos, Her 2 Neg    Plan   All options of management discussed.  Pt is eager to proceed with lumpectomy and SN biopsy. Procedure explained in full to pt.Genetic test pending but pt is not iclined at present for any radical preventive measures if test is positiver. Patient's surgery has been scheduled for 10-23-14 at [facility]. It is okay for patient to continue 81 mg aspirin once daily.      PCP:  Burnett Harry G 10/18/2014, 1:33 PM

## 2014-10-23 NOTE — Discharge Instructions (Signed)

## 2014-10-23 NOTE — Progress Notes (Signed)
Pt request "if anything happens Salome Spotted is her person to contact". Salome Spotted is here today with pt.

## 2014-10-23 NOTE — Interval H&P Note (Signed)
History and Physical Interval Note:  10/23/2014 8:46 AM  Elizabeth Flynn  has presented today for surgery, with the diagnosis of CA RT.BREAST  The various methods of treatment have been discussed with the patient and family. After consideration of risks, benefits and other options for treatment, the patient has consented to  Procedure(s): BREAST LUMPECTOMY WITH SENTINEL LYMPH NODE BX (Right) as a surgical intervention .  The patient's history has been reviewed, patient examined, no change in status, stable for surgery.  I have reviewed the patient's chart and labs.  Questions were answered to the patient's satisfaction.     Kaicee Scarpino G

## 2014-10-23 NOTE — Transfer of Care (Signed)
Immediate Anesthesia Transfer of Care Note  Patient: Elizabeth Flynn  Procedure(s) Performed: Procedure(s): Right breast lumpectomy with sentinel node biopsy  (Right)  Patient Location: PACU  Anesthesia Type:General  Level of Consciousness: awake, alert  and oriented  Airway & Oxygen Therapy: Patient Spontanous Breathing and Patient connected to face mask oxygen  Post-op Assessment: Report given to RN  Post vital signs: Reviewed and stable  Last Vitals:  Filed Vitals:   10/23/14 1118  BP: 159/94  Pulse: 82  Temp: 36.9 C  Resp: 20    Complications: No apparent anesthesia complications

## 2014-10-23 NOTE — Op Note (Signed)
Preop diagnosis: Invasive carcinoma right breast  Post op diagnosis: Same  Operation: Right breast lumpectomy with sentinel node biopsy. Ultrasound-guided wire localization of right breast lesion  Surgeon: S.G.Kimmarie Pascale  Assistant:     Anesthesia: Gen.  Complications: None  EBL: Minimal  Drains: None  Description: Patient underwent nuclear contrast injection prior to the procedure she was brought to the operating room and placed in the supine position the operating table. Anesthesia obtained with the use of an LMA. Timeout was performed. Right breast and axilla were prepped and draped as sterile field. A Gamma finder was utilized with 2 excellent signal activity in the medial inferior portion of the axilla. A transverse skin incision was made at this site and dissected down to the axillary fat pad. Bleeding was controlled cautery and ligatures of 3-0 Vicryl. 3 sentinel nodes were identified with marked signal activity -all subcentimeter in size. Frozen section of all 3 showed no evidence of macro metastases. Lumpectomy was performed while waiting for frozen section. Ultrasound was utilized to locate the biopsy cavity and clip in the 1:00 location just outside the areolar region. Using this guide a Bard ultralight was then positioned going through the biopsy cavity. This was anchored in place. The curvilinear incision matching the areolar margin was made overlying the site of the wire. Dissected down to the glandular tissue and using the wire as a guide a good core of tissue surrounding this was excised out. Grossly the margins appeared clear. The wound was then irrigated and both wounds were closed with 2-0 Vicryl in the deeper tissues. 10 mL of half percent Marcaine was instilled in the axillary and in the breast incision. Skin incisions were closed with subcuticular 3-0 Monocryl and covered with liqui ban. Procedure well-tolerated with no immediate problems encountered. Patient subsequently  returned recovery room in stable condition.

## 2014-10-24 ENCOUNTER — Telehealth: Payer: Self-pay

## 2014-10-24 NOTE — Telephone Encounter (Signed)
Follow-up call to check on patient post-op.  No answer.

## 2014-10-25 ENCOUNTER — Telehealth: Payer: Self-pay

## 2014-10-25 ENCOUNTER — Telehealth: Payer: Self-pay | Admitting: *Deleted

## 2014-10-25 MED ORDER — ONDANSETRON 4 MG PO TBDP
4.0000 mg | ORAL_TABLET | Freq: Four times a day (QID) | ORAL | Status: DC | PRN
Start: 2014-10-25 — End: 2014-10-25

## 2014-10-25 MED ORDER — ONDANSETRON 4 MG PO TBDP: 4.0000 mg | ORAL_TABLET | Freq: Four times a day (QID) | ORAL | Status: DC | PRN

## 2014-10-25 NOTE — Telephone Encounter (Signed)
[facility] sent to [facility], pt aware.

## 2014-10-25 NOTE — Telephone Encounter (Signed)
Patient phoned reporting nausea and "dry heaves".   Notified Emily at [place] office.  Patient requests prescription for antiemetic be sent to [facility] on [address].  States she has some soreness under arm at lymph node biopsy site, but otherwise only mild discomfort.  Post -op appointment with Dr. Jamal Collin scheduled for 11/01/14.

## 2014-10-25 NOTE — Telephone Encounter (Signed)
Patient had surgery on 10/23/14 and she is still nausea. She was wondering if there was something we can call in for her. She uses [facility] on [address] in [place]

## 2014-10-25 NOTE — Telephone Encounter (Signed)
Send Rx for Zofran 4 mg , 1 po q6h prn. # 10

## 2014-10-29 LAB — SURGICAL PATHOLOGY

## 2014-11-01 ENCOUNTER — Encounter: Payer: Self-pay | Admitting: General Surgery

## 2014-11-01 ENCOUNTER — Ambulatory Visit (INDEPENDENT_AMBULATORY_CARE_PROVIDER_SITE_OTHER): Payer: PRIVATE HEALTH INSURANCE | Admitting: General Surgery

## 2014-11-01 VITALS — BP 130/74 | HR 72 | Resp 14 | Ht 66.0 in | Wt 224.0 lb

## 2014-11-01 DIAGNOSIS — C50211 Malignant neoplasm of upper-inner quadrant of right female breast: Secondary | ICD-10-CM

## 2014-11-01 NOTE — Patient Instructions (Signed)
Patient to return in one month. 

## 2014-11-01 NOTE — Progress Notes (Signed)
Patient ID: Elizabeth Flynn, female   DOB: Aug 23, 1970, 44 y.o.   MRN: [id number]  Chief Complaint  Patient presents with  . Routine Post Op    right breast lumpectomy    HPI Elizabeth Flynn is a 44 y.o. female here today for her post op right breast lumpectomy done on 10/23/14. Patient states she has a knot under her right arm. HPI  Past Medical History  Diagnosis Date  . Kidney stones   . Anemia   . Ovarian cyst   . Blood transfusion without reported diagnosis   . Hypertension   . Stroke     Sept. 2014  . Cancer of right breast 10/16/2014    Upper and inner quadrant right breast, Invasive ductal carcinoma, Atypical Ductal Hyperplasia, ER/PR Pos, Her 2 Neg  . Anxiety   . Headache   . MRSA infection 2009  . Dysrhythmia     TACHYCARDIA  . Enlarged heart     Past Surgical History  Procedure Laterality Date  . Cholecystectomy    . Tonsillectomy    . Hernia repair    . Breast biopsy Right 10/10/2014  . Tubal ligation    . Lithotripsy    . Breast lumpectomy with sentinel lymph node biopsy Right 10/23/2014    Procedure: Right breast lumpectomy with sentinel node biopsy ;  Surgeon: Christene Lye, MD;  Location: [facility] ORS;  Service: General;  Laterality: Right;    Family History  Problem Relation Age of Onset  . Breast cancer Cousin 76    Social History History  Substance Use Topics  . Smoking status: Former Smoker    Types: Cigarettes  . Smokeless tobacco: Not on file  . Alcohol Use: No     Comment: SOBER FOR 3 [id number])    Allergies  Allergen Reactions  . Hydrocodone-Acetaminophen Hives  . Hydrocodone Itching  . Reglan [Metoclopramide] Other (See Comments)    "freaks me out, makes me nervous"  . Clindamycin/Lincomycin Rash  . Morphine And Related Rash    Current Outpatient Prescriptions  Medication Sig Dispense Refill  . acetaminophen (TYLENOL) 500 MG tablet Take 1,500 mg by mouth every 6 (six) hours as needed. For pain    . aspirin 81 MG tablet  Take 81 mg by mouth daily.    . Iron TABS Take 1 tablet by mouth daily.    Marland Kitchen lamoTRIgine (LAMICTAL) 25 MG tablet Take 25 mg by mouth at bedtime.     Marland Kitchen lisinopril (PRINIVIL,ZESTRIL) 10 MG tablet Take 10 mg by mouth every evening.     Marland Kitchen LORazepam (ATIVAN) 0.5 MG tablet Take 0.5 mg by mouth every 6 (six) hours as needed for anxiety.    . ondansetron (ZOFRAN ODT) 4 MG disintegrating tablet Take 1 tablet (4 mg total) by mouth every 6 (six) hours as needed for nausea or vomiting. 10 tablet 0  . traMADol (ULTRAM) 50 MG tablet Take 1 tablet (50 mg total) by mouth every 6 (six) hours as needed. 30 tablet 0  . vitamin C (ASCORBIC ACID) 500 MG tablet Take 500 mg by mouth daily.    . promethazine (PHENERGAN) 25 MG tablet Take 1 tablet (25 mg total) by mouth every 6 (six) hours as needed for nausea. 30 tablet 0   No current facility-administered medications for this visit.    Review of Systems Review of Systems  Constitutional: Negative.   Respiratory: Negative.   Cardiovascular: Negative.     Blood pressure 130/74, pulse 72, resp. rate 14, height 5'  6" (1.676 m), weight 224 lb (101.606 kg), last menstrual period 10/17/2014.  Physical Exam Physical Exam  Constitutional: She is oriented to person, place, and time. She appears well-developed and well-nourished.  Pulmonary/Chest: Right breast exhibits no inverted nipple, no mass, no nipple discharge, no skin change and no tenderness.  Right breast lumpectomy and axillary site is clean and healing well  Neurological: She is alert and oriented to person, place, and time.  Skin: Skin is warm and dry.    Data Reviewed Pathology showed no residual cancer in lumpectomy. Initial size was 5 mm. Sentinel node is negative.   Assessment      Right breast invasive carcinoma. T1A, N0. ERPR positive and HER2 negative.   Plan    Patient to return in one month.  Referral to radiation oncology for completion of local treatment. Patient to receive  antihormonal therapy after.      PCP:  Vivianne Spence, Cadience Bradfield 11/01/2014, 9:15 AM

## 2014-11-05 ENCOUNTER — Inpatient Hospital Stay: Payer: Medicaid Other | Attending: Oncology | Admitting: Oncology

## 2014-11-05 ENCOUNTER — Encounter: Payer: Self-pay | Admitting: *Deleted

## 2014-11-05 ENCOUNTER — Encounter: Payer: Self-pay | Admitting: Oncology

## 2014-11-05 VITALS — BP 164/106 | HR 93 | Temp 97.8°F | Resp 20 | Ht 66.0 in | Wt 221.0 lb

## 2014-11-05 DIAGNOSIS — Z79899 Other long term (current) drug therapy: Secondary | ICD-10-CM

## 2014-11-05 DIAGNOSIS — F419 Anxiety disorder, unspecified: Secondary | ICD-10-CM | POA: Diagnosis not present

## 2014-11-05 DIAGNOSIS — I10 Essential (primary) hypertension: Secondary | ICD-10-CM | POA: Insufficient documentation

## 2014-11-05 DIAGNOSIS — Z170 Estrogen receptor positive status [ER+]: Secondary | ICD-10-CM | POA: Insufficient documentation

## 2014-11-05 DIAGNOSIS — Z87891 Personal history of nicotine dependence: Secondary | ICD-10-CM | POA: Insufficient documentation

## 2014-11-05 DIAGNOSIS — Z8673 Personal history of transient ischemic attack (TIA), and cerebral infarction without residual deficits: Secondary | ICD-10-CM | POA: Diagnosis not present

## 2014-11-05 DIAGNOSIS — C50911 Malignant neoplasm of unspecified site of right female breast: Secondary | ICD-10-CM | POA: Insufficient documentation

## 2014-11-05 NOTE — Progress Notes (Signed)
Met patient today during her medical oncology visit.  Treatment plan for radiation therapy and antihormonal therapy.  She is to follow-up with Dr. Oliva Bustard in 2 months.

## 2014-11-07 ENCOUNTER — Encounter: Payer: Self-pay | Admitting: Radiation Oncology

## 2014-11-07 ENCOUNTER — Ambulatory Visit
Admission: RE | Admit: 2014-11-07 | Discharge: 2014-11-07 | Disposition: A | Discharge status: Home / Self Care | Payer: Medicaid Other | Source: Ambulatory Visit | Attending: Radiation Oncology | Admitting: Radiation Oncology

## 2014-11-07 VITALS — BP 171/102 | HR 82 | Temp 97.9°F | Resp 18 | Wt 221.2 lb

## 2014-11-07 DIAGNOSIS — Z170 Estrogen receptor positive status [ER+]: Secondary | ICD-10-CM | POA: Insufficient documentation

## 2014-11-07 DIAGNOSIS — Z87891 Personal history of nicotine dependence: Secondary | ICD-10-CM | POA: Insufficient documentation

## 2014-11-07 DIAGNOSIS — C50811 Malignant neoplasm of overlapping sites of right female breast: Secondary | ICD-10-CM | POA: Insufficient documentation

## 2014-11-07 DIAGNOSIS — C50911 Malignant neoplasm of unspecified site of right female breast: Secondary | ICD-10-CM

## 2014-11-07 DIAGNOSIS — Z510 Encounter for antineoplastic radiation therapy: Secondary | ICD-10-CM | POA: Insufficient documentation

## 2014-11-07 NOTE — Consult Note (Signed)
Except an outstanding is perfect of Radiation Oncology NEW PATIENT EVALUATION  Name: Elizabeth Flynn  MRN: [id number]  Date:   11/07/2014     DOB: 04/29/1970   This 44 y.o. female patient presents to the clinic for initial evaluation of stage I breast cancer.  REFERRING PHYSICIAN: Lavonne Chick, MD  CHIEF COMPLAINT:  Chief Complaint  Patient presents with  . Breast Cancer    Pt is here for initial consult of breast cancer.      DIAGNOSIS: The encounter diagnosis was Malignant neoplasm of female breast, right.   PREVIOUS INVESTIGATIONS:  Mammograms and ultrasound reviewed Pathology report reviewed Clinical notes reviewed Case presented at breast cancer conference  HPI: patient is a 44 year old female who presented to the [facility] program with a history of pelvic pain. Routine mammogram was orderedshowing distortion in the right breast in the medial aspect.stereotactic biopsy was performed positive for 5 mm focus of invasive mammary carcinoma. Tumor was ER/PR positive HER-2/neu not overexpressed tumor was extremely superficial. She went on to have a wide local excision showing no evidence of residual disease 3 sentinel lymph nodes were negative for metastatic disease.tumor was a T1 1 N0 M0 invasive mammary carcinoma. She has done well postoperatively. Case was presented at our weekly tumor conference and adjuvant radiation therapy was recommended. Patient also would be a candidate for tamoxifen or aromatase inhibitor therapy after completion of radiation.  PLANNED TREATMENT REGIMEN: hypofractionated whole breast radiation  PAST MEDICAL HISTORY:  has a past medical history of Kidney stones; Anemia; Ovarian cyst; Blood transfusion without reported diagnosis; Hypertension; Stroke; Cancer of right breast (10/16/2014); Anxiety; Headache; MRSA infection (2009); Dysrhythmia; and Enlarged heart.    PAST SURGICAL HISTORY:  Past Surgical History  Procedure Laterality Date  . Cholecystectomy     . Tonsillectomy    . Hernia repair    . Breast biopsy Right 10/10/2014  . Tubal ligation    . Lithotripsy    . Breast lumpectomy with sentinel lymph node biopsy Right 10/23/2014    Procedure: Right breast lumpectomy with sentinel node biopsy ;  Surgeon: Christene Lye, MD;  Location: [facility] ORS;  Service: General;  Laterality: Right;    FAMILY HISTORY: family history includes Breast cancer (age of onset: 46) in her cousin.  SOCIAL HISTORY:  reports that she has quit smoking. Her smoking use included Cigarettes. She does not have any smokeless tobacco history on file. She reports that she does not drink alcohol or use illicit drugs.  ALLERGIES: Hydrocodone-acetaminophen; Hydrocodone; Reglan; Clindamycin/lincomycin; and Morphine and related  MEDICATIONS:  Current Outpatient Prescriptions  Medication Sig Dispense Refill  . acetaminophen (TYLENOL) 500 MG tablet Take 1,500 mg by mouth every 6 (six) hours as needed. For pain    . aspirin 81 MG tablet Take 81 mg by mouth daily.    . DimenhyDRINATE (DRAMAMINE PO) Take 1 tablet by mouth as needed.    . Iron TABS Take 1 tablet by mouth daily.    Marland Kitchen lamoTRIgine (LAMICTAL) 25 MG tablet Take 25 mg by mouth at bedtime.     Marland Kitchen lisinopril (PRINIVIL,ZESTRIL) 10 MG tablet Take 10 mg by mouth every evening.     . traMADol (ULTRAM) 50 MG tablet Take 1 tablet (50 mg total) by mouth every 6 (six) hours as needed. 30 tablet 0  . vitamin C (ASCORBIC ACID) 500 MG tablet Take 500 mg by mouth daily.    . promethazine (PHENERGAN) 25 MG tablet Take 1 tablet (25 mg total) by mouth  every 6 (six) hours as needed for nausea. 30 tablet 0   No current facility-administered medications for this encounter.    ECOG PERFORMANCE STATUS:  0 - Asymptomatic  REVIEW OF SYSTEMS:  Patient denies any weight loss, fatigue, weakness, fever, chills or night sweats. Patient denies any loss of vision, blurred vision. Patient denies any ringing  of the ears or hearing loss. No  irregular heartbeat. Patient denies heart murmur or history of fainting. Patient denies any chest pain or pain radiating to her upper extremities. Patient denies any shortness of breath, difficulty breathing at night, cough or hemoptysis. Patient denies any swelling in the lower legs. Patient denies any nausea vomiting, vomiting of blood, or coffee ground material in the vomitus. Patient denies any stomach pain. Patient states has had normal bowel movements no significant constipation or diarrhea. Patient denies any dysuria, hematuria or significant nocturia. Patient denies any problems walking, swelling in the joints or loss of balance. Patient denies any skin changes, loss of hair or loss of weight. Patient denies any excessive worrying or anxiety or significant depression. Patient denies any problems with insomnia. Patient denies excessive thirst, polyuria, polydipsia. Patient denies any swollen glands, patient denies easy bruising or easy bleeding. Patient denies any recent infections, allergies or URI. Patient "s visual fields have not changed significantly in recent time.    PHYSICAL EXAM: BP 171/102 mmHg  Pulse 82  Temp(Src) 97.9 F (36.6 C)  Resp 18  Wt 221 lb 3.7 oz (100.35 kg)  LMP 10/17/2014 (Exact Date) A well-developed slightly obese female in NAD. Breasts are small. She status post wide local excision of the right breast with sparing of the nipple areolar complex. No dominant mass or nodularity is noted in either breast in 2 positions examined. No axillary or supra clavicular adenopathy is appreciated. Well-developed well-nourished patient in NAD. HEENT reveals PERLA, EOMI, discs not visualized.  Oral cavity is clear. No oral mucosal lesions are identified. Neck is clear without evidence of cervical or supraclavicular adenopathy. Lungs are clear to A&P. Cardiac examination is essentially unremarkable with regular rate and rhythm without murmur rub or thrill. Abdomen is benign with no  organomegaly or masses noted. Motor sensory and DTR levels are equal and symmetric in the upper and lower extremities. Cranial nerves II through XII are grossly intact. Proprioception is intact. No peripheral adenopathy or edema is identified. No motor or sensory levels are noted. Crude visual fields are within normal range.   LABORATORY DATA: pathology reports reviewed    RADIOLOGY RESULTS:mammograms and ultrasound are reviewed   IMPRESSION: stage I invasive mammary carcinoma the right breast as was wide local excision and sentinel node biopsy ER/PR positive in 44 year old female  PLAN: based on the close proximity to the nipple areolar complex in the extreme superficial nature of her lesion patient would not be candidate for accelerated partial breast irradiation and I have discussed that with surgeon. I have recommended whole breast radiation. I believe we can use the hypofractionated regimen over 4 weeks boosting her scar another 1400 cGy using electron beam. Risks and benefits of treatment including skin reaction, fatigue, alteration of blood counts, thickening of the breast, inclusion of some superficial lung all were discussed in detail with the patient and her mother. Both seem to comprehend my treatment plan well. I have set up and ordered CT simulation in about a week's time. Patient also would be candidate for either tamoxifen aromatase inhibitor therapy after completion of radiation.  I would like to take  this opportunity for allowing me to participate in the care of your patient.Armstead Peaks., MD

## 2014-11-17 ENCOUNTER — Encounter: Payer: Self-pay | Admitting: Oncology

## 2014-11-17 NOTE — Progress Notes (Signed)
[place] @ [facility] [phone]  Fax:([phone]) [place]  Shannen Flansburg OB: 06/06/1970  MR#: [id number]  TKP#:[id number]  Patient Care Team: Lavonne Chick, MD as PCP - General (Family Medicine) Rico Junker, RN as Registered Nurse (Radiology) Theodore Demark, RN as Registered Nurse Seepl[place]ur Robinette Haines, MD (General Surgery)   VISIT DIAGNOSIS:   Carcinoma of right breast  Oncology History   1.  Abnormal mammogram with calcification in the right breast.  Biopsy on October 10, 2014 was positive for invasive carcinoma 2.  Status post lumpectomy and axillary lymph node evaluation 3.  My risk genetic study (July of 2016) negative.  No clinically significant mutation identified     Cancer of right breast   10/16/2014 Initial Diagnosis Cancer of right breast    Oncology Flowsheet 11/01/2011 08/12/2014 10/23/2014 10/23/2014  ondansetron (ZOFRAN) IV 4 mg - -    promethazine (PHENERGAN) IV 25 mg - 12.5 mg 12.5 mg  promethazine (PHENERGAN) PO - 25 mg - -    INTERVAL HISTORY:  44 year old lady presented to [facility] clinic with history of pelvic pain.  Routine mammogram because patient never had mammogram was ordered and was abnormal in the right breast.  Stereotactic biopsy was positive for invasive cancer patient was referred to me for discussion regarding various options of therapy Extremely apprehensive patient accompanied by friend. Patient's mother who is a Marine scientist lives in [place].  August, 2016 Patient had lumpectomy and sentinel lymph node evaluation.  No residual tumor was found.  Patient is here to discuss the results and further options of therapy Accompanied with few friends. REVIEW OF SYSTEMS:  GENERAL:  Feels good.  Active.  No fevers, sweats or weight loss. PERFORMANCE STATUS (ECOG): O HEENT:  No visual changes, runny nose, sore throat, mouth sores or tenderness. Lungs: No shortness of breath or cough.  No hemoptysis. Cardiac:  No chest pain,  palpitations, orthopnea, or PND. GI:  No nausea, vomiting, diarrhea, constipation, melena or hematochezia. GU:  No urgency, frequency, dysuria, or hematuria. Musculoskeletal:  No back pain.  No joint pain.  No muscle tenderness. Extremities:  No pain or swelling. Skin:  No rashes or skin changes. Neuro:  No headache, numbness or weakness, balance or coordination issues. In this September off 2014 patient had bleed in the brain and exact history not available. Diagnosis in [place], [place] Endocrine:  No diabetes, thyroid issues, hot flashes or night sweats. Psych: Patient has a mood disorder and on chronic medication being managed by a nurse practitioner Pain:  No focal pain. Review of systems:  All other systems reviewed and found to be negative. As per HPI. Otherwise, a complete review of systems is negatve.  PAST MEDICAL HISTORY: Past Medical History  Diagnosis Date  . Kidney stones   . Anemia   . Ovarian cyst   . Blood transfusion without reported diagnosis   . Hypertension   . Stroke     Sept. 2014  . Cancer of right breast 10/16/2014    Upper and inner quadrant right breast, Invasive ductal carcinoma, Atypical Ductal Hyperplasia, ER/PR Pos, Her 2 Neg  . Anxiety   . Headache   . MRSA infection 2009  . Dysrhythmia     TACHYCARDIA  . Enlarged heart     PAST SURGICAL HISTORY: Past Surgical History  Procedure Laterality Date  . Cholecystectomy    . Tonsillectomy    . Hernia repair    . Breast biopsy Right 10/10/2014  .  Tubal ligation    . Lithotripsy    . Breast lumpectomy with sentinel lymph node biopsy Right 10/23/2014    Procedure: Right breast lumpectomy with sentinel node biopsy ;  Surgeon: Christene Lye, MD;  Location: [facility] ORS;  Service: General;  Laterality: Right;    FAMILY HISTORY Family History  Problem Relation Age of Onset  . Breast cancer Cousin 22   Mother had atypical cells in both breasts and has been removed before age 59 Streaky  of lung cancer and stomach cancer in the family GYNECOLOGIC HISTORY: Heavy menstrual cycle every 14 days  ADVANCED DIRECTIVES:Patient does not have any living will or healthcare power of attorney.  Information was given .  Available resources had been discussed.  We will follow-up on subsequent appointments regarding this issue    HEALTH MAINTENANCE: Social History  Substance Use Topics  . Smoking status: Former Smoker    Types: Cigarettes  . Smokeless tobacco: None  . Alcohol Use: No     Comment: SOBER FOR 3 ERXVQ(0086)      Allergies  Allergen Reactions  . Hydrocodone-Acetaminophen Hives  . Hydrocodone Itching  . Reglan [Metoclopramide] Other (See Comments)    "freaks me out, makes me nervous"  . Clindamycin/Lincomycin Rash  . Morphine And Related Rash    Current Outpatient Prescriptions  Medication Sig Dispense Refill  . acetaminophen (TYLENOL) 500 MG tablet Take 1,500 mg by mouth every 6 (six) hours as needed. For pain    . aspirin 81 MG tablet Take 81 mg by mouth daily.    . DimenhyDRINATE (DRAMAMINE PO) Take 1 tablet by mouth as needed.    . Iron TABS Take 1 tablet by mouth daily.    Marland Kitchen lamoTRIgine (LAMICTAL) 25 MG tablet Take 25 mg by mouth at bedtime.     Marland Kitchen lisinopril (PRINIVIL,ZESTRIL) 10 MG tablet Take 10 mg by mouth every evening.     . traMADol (ULTRAM) 50 MG tablet Take 1 tablet (50 mg total) by mouth every 6 (six) hours as needed. 30 tablet 0  . vitamin C (ASCORBIC ACID) 500 MG tablet Take 500 mg by mouth daily.    . promethazine (PHENERGAN) 25 MG tablet Take 1 tablet (25 mg total) by mouth every 6 (six) hours as needed for nausea. 30 tablet 0   No current facility-administered medications for this visit.    OBJECTIVE: PHYSICAL EXAM: GENERAL:  Well developed, well nourished, sitting comfortably in the exam room in no acute distress.  Moderately obese lady very apprehensive MENTAL STATUS:  Alert and oriented to person, place and time. HEAD Normocephalic,  atraumatic, face symmetric, no Cushingoid features. EYES:  Pupils equal round and reactive to light and accomodation.  No conjunctivitis or scleral icterus. ENT:  Oropharynx clear without lesion.  Tongue normal. Mucous membranes moist.  RESPIRATORY:  Clear to auscultation without rales, wheezes or rhonchi. CARDIOVASCULAR:  Regular rate and rhythm without murmur, rub or gallop. BREAST:  Right breast palpable mass due to recent biopsy in upper and inner quadrant  Left breast without masses, skin changes or nipple discharge.  Similarly lymph nodes are not palpable ABDOMEN:  Soft, non-tender, with active bowel sounds, and no hepatosplenomegaly.  No masses. BACK:  No CVA tenderness.  No tenderness on percussion of the back or rib cage. SKIN:  No rashes, ulcers or lesions. EXTREMITIES: No edema, no skin discoloration or tenderness.  No palpable cords. LYMPH NODES: No palpable cervical, supraclavicular, axillary or inguinal adenopathy  NEUROLOGICAL: Unremarkable. PSYCH:  Appropriate.  Filed Vitals:   11/05/14 1502  BP: 164/106  Pulse: 93  Temp: 97.8 F (36.6 C)  Resp: 20     Body mass index is 35.69 kg/(m^2).    ECOG FS:0 - Asymptomatic  LAB RESULTS:  No visits with results within 2 Day(s) from this visit. Latest known visit with results is:  Admission on 10/23/2014, Discharged on 10/23/2014  Component Date Value Ref Range Status  . Sodium 10/23/2014 137  135 - 145 mmol/L Final  . Potassium 10/23/2014 3.8  3.5 - 5.1 mmol/L Final  . Chloride 10/23/2014 105  101 - 111 mmol/L Final  . CO2 10/23/2014 25  22 - 32 mmol/L Final  . Glucose, Bld 10/23/2014 95  65 - 99 mg/dL Final  . BUN 10/23/2014 15  6 - 20 mg/dL Final  . Creatinine, Ser 10/23/2014 0.69  0.44 - 1.00 mg/dL Final  . Calcium 10/23/2014 8.8* 8.9 - 10.3 mg/dL Final  . Total Protein 10/23/2014 6.8  6.5 - 8.1 g/dL Final  . Albumin 10/23/2014 4.0  3.5 - 5.0 g/dL Final  . AST 10/23/2014 19  15 - 41 U/L Final  . ALT 10/23/2014 11* 14  - 54 U/L Final  . Alkaline Phosphatase 10/23/2014 56  38 - 126 U/L Final  . Total Bilirubin 10/23/2014 0.7  0.3 - 1.2 mg/dL Final  . GFR calc non Af Amer 10/23/2014 >60  >60 mL/min Final  . GFR calc Af Amer 10/23/2014 >60  >60 mL/min Final   Comment: (NOTE) The eGFR has been calculated using the CKD EPI equation. This calculation has not been validated in all clinical situations. eGFR's persistently <60 mL/min signify possible Chronic Kidney Disease.   . Anion gap 10/23/2014 7  5 - 15 Final  . Preg Test, Ur 10/23/2014 NEGATIVE  NEGATIVE Final   Comment:        THE SENSITIVITY OF THIS METHODOLOGY IS >24 mIU/mL   . SURGICAL PATHOLOGY 10/23/2014    Final                   Value:Surgical Pathology CASE: [id number] PATIENT: Quinita Bellows Surgical Pathology Report     SPECIMEN SUBMITTED: A. Lymph node #1, sentinel B. Lymph node #2, sentinel C. Lymph node #3, sentinel D. Breast, right, 1:00, lumpectomy  CLINICAL HISTORY: Outside biopsy [phone] - Invasive ductal carcinoma. 6-7 mm lesion.  PRE-OPERATIVE DIAGNOSIS: CA RT Breast  POST-OPERATIVE DIAGNOSIS: Right breast cancer     DIAGNOSIS: A. SENTINEL LYMPH NODE #1; EXCISION: - ONE LYMPH NODE, NEGATIVE FOR MALIGNANCY (0/1).  B. SENTINEL LYMPH NODE #2; EXCISION: - ONE LYMPH NODE, NEGATIVE FOR MALIGNANCY (0/1).  C. SENTINEL LYMPH NODE #3; EXCISION: - ONE LYMPH NODE, NEGATIVE FOR MALIGNANCY (0/1).  D. BREAST, RIGHT 1:00; NEEDLE LOCALIZED LUMPECTOMY: - NEGATIVE FOR RESIDUAL MALIGNANCY. - BIOPSY CAVITY, ENTIRELY SUBMITTED. - SEE COMMENT.  Comment: The size of the invasive carcinoma (5 mm) in the outside biopsy ([id number]) was provided by Dr. Enid Cutter from [facility] on                          10/25/14. Based on the measurement in the prior biopsy and the findings in this case the AJCC staging is pT1a N0 (sn). Per outside report the carcinoma is: ER positive (95%) PR: positive (40%) HER2 FISH: Negative  (not amplified)     GROSS DESCRIPTION: A. Intra Operative Consultation:     Received: Fresh     Specimen: Sentinel node #1  Pathologic Evaluation: Frozen section     Diagnosis: FS A1: One lymph node, negative for macrometastasis     Communicated to: Dr. Jamal Collin 10:18 AM on 10/23/2014 by Dr. Reuel Derby     Tissue submitted: A1  Labeled: Sentinel node #1 Tissue Fragment(s): 1 Measurement: 2.3 x 1.3 x 0.7 cm Comment: Piece of pale yellow adipose tissue which upon dissection reveals a single pink red lymph node candidate measuring 0.9 x 0.6 x 0.5 cm  Entirely submitted (except for the attached adipose tissue) in cassette(s): 1  B. Intra Operative Consultation:     Received: Fresh     Specimen: Sentinel node #2     Pathologic Evaluation: Frozen sectio                         n     Diagnosis: FSB B1: One lymph node, negative for macrometastasis     Communicated to: Dr. Jamal Collin 10:46 AM on 10/23/2014 by Dr. Reuel Derby     Tissue submitted: B1  Labeled: Sentinel node #2 Tissue Fragment(s): 1 Measurement: 1.3 x 1.2 x 0.6 cm Comment: Piece of yellow adipose tissue which upon dissection reveals a single pink red lymph node candidate measuring 0.9 x 0.8 x 0.6 cm  Entirely submitted (except the attached adipose tissue) in cassette(s): 1  C. Intra Operative Consultation:     Received: Fresh     Specimen: Sentinel node #3     Pathologic Evaluation: Frozen section     Diagnosis: FS C1: One lymph node negative for macrometastasis     Communicated to: Dr. Jamal Collin 10:46 AM on 10/23/2014 by Dr. Reuel Derby     Tissue submitted: C1  Labeled: Sentinel node #3 Tissue Fragment(s): 1 Measurement: 1.8 x 1 x 0.3 cm Comment: Piece of yellow adipose tissue reveals a minute pale tan firm fragment possibly lymph node candidate measuring 0.4 x 0.2 x 0.2  Entirely                          submitted (except for the attached adipose tissue) in cassette(s): 1  D. Intra Operative Consultation:      Received: Fresh     Specimen: Right breast lumpectomy     Pathologic Evaluation: Gross evaluation of margins     Diagnosis: IOC: Biopsy cavity grossly 0.6 cm from closest cranial margin     Communicated to: Dr. Jamal Collin at 10:56 AM on 10/23/2014 by Dr. Reuel Derby     Tissue submitted: For permanent section  Labeled: Right breast lumpectomy  Accompanying x-ray film present: Yes X-ray film findings: Metallic clip noted within tissue  Time in fixative: 6 hours  Cold Ischemic Time: 26 minutes  Type of specimen: Unspecified  Location of specimen: Right  Size of specimen: Cranial-caudal 5.1 cm, medial-lateral 3.5 cm, skin-deep 5.5 cm  Skin: Absent  Direction of compression: Skin-deep  Needle localization: Yes, 1  Orientation of specimen: Orienting metallic markers are present and designated cranial, caudal, medial, lateral, skin, and deep  Plane of sectio                         ning: Cranial-caudal  Biopsy site: Biopsy cavity identified with metallic clip within it  Presence/absence of discrete mass: Absent  Number of discrete masses: None  Size(s) of mass(es): Biopsy cavity measures 1.7 x 0.7 x 1.2 cm  Distance of mass(es)/ biopsy site/clip to surgical margins: Biopsy cavity is grossly located  0.6 cm away from the nearest overlying perpendicular cranial margin of resection, and 1 cm or greater from all other margins of resection. Biopsy cavity is hemorrhagic, and surrounding tissue as somewhat firm  Description of remainder of tissue: Predominantly fatty with small amounts of interspersed white fibrous tissue  Other remarkable features: None  Tissue submitted for special investigation: None  Block summary: 1-4-sections of biopsy cavity with overlying perpendicular cranial and skin margins of resection 5-representative perpendicular caudal margin 6-representative perpendicular medial margin 7-representative perpendicular lateral ma                          rgin 8-representative perpendicular deep margin 9-10- rest of biopsy cavity submitted on 10/25/14  Cranial = blue Caudal = green Medial = yellow Lateral = orange Posterior = black Anterior/Superficial = red                   Final Diagnosis performed by Quay Burow, MD.  Electronically signed 10/29/2014 11:35:35AM    The electronic signature indicates that the named Attending Pathologist has evaluated the specimen  Technical component performed at [facility], [address], [place], [place] [address] Lab: [phone] Dir: Darrick Penna. Evette Doffing, MD  Professional component performed at [facility], [facility], [place], [place], [place] [address] Lab: [phone] Dir: Dellia Nims. Rubinas, MD       STUDIES: Nm Sentinel Node Inj-no Rpt (breast)  10/23/2014   CLINICAL DATA: right breast cancer   Sulfur colloid was injected intradermally by the nuclear medicine  technologist for breast cancer sentinel node localization.     ASSESSMENT:  Carcinoma of breast based on ultrasound and mammogram appears to be early stage tumor Invasive cancer Estrogen progesterone and HER-2 receptor not available at present time    PLAN:   Detailed discussion regarding various options for local therapy including lumpectomy mastectomy patient and number of questions regarding reconstructive surgery My risk study does not demonstrate any genetic mutation Had prolonged discussion regarding need for radiation therapy being a standard treatment at present time.  Patient is very much against radiation therapy but would like to get a opinion from radiation oncologists.  This needs to be followed by anti-hormonal therapy and that has been discussed with the patient.  Considering patient's premenopausal status she would need tamoxifen Total duration of visit was 30 minutes.  50% or more time was spent in counseling patient and family regarding prognosis and options of treatment  and available resources  Patient expressed understanding and was in agreement with this plan. She also understands that She can call clinic at any time with any questions, concerns, or complaints.    No matching staging information was found for the patient.  Forest Gleason, MD   11/17/2014 8:51 AM

## 2014-11-20 ENCOUNTER — Ambulatory Visit
Admission: RE | Admit: 2014-11-20 | Discharge: 2014-11-20 | Disposition: A | Discharge status: Home / Self Care | Payer: Medicaid Other | Source: Ambulatory Visit | Attending: Radiation Oncology | Admitting: Radiation Oncology

## 2014-11-20 DIAGNOSIS — C50811 Malignant neoplasm of overlapping sites of right female breast: Secondary | ICD-10-CM | POA: Diagnosis not present

## 2014-11-20 DIAGNOSIS — Z170 Estrogen receptor positive status [ER+]: Secondary | ICD-10-CM | POA: Diagnosis not present

## 2014-11-20 DIAGNOSIS — Z87891 Personal history of nicotine dependence: Secondary | ICD-10-CM | POA: Diagnosis not present

## 2014-11-20 DIAGNOSIS — Z510 Encounter for antineoplastic radiation therapy: Secondary | ICD-10-CM | POA: Diagnosis present

## 2014-11-23 ENCOUNTER — Other Ambulatory Visit: Payer: Self-pay | Admitting: *Deleted

## 2014-11-23 DIAGNOSIS — Z510 Encounter for antineoplastic radiation therapy: Secondary | ICD-10-CM | POA: Diagnosis not present

## 2014-11-23 DIAGNOSIS — C50911 Malignant neoplasm of unspecified site of right female breast: Secondary | ICD-10-CM

## 2014-11-27 ENCOUNTER — Ambulatory Visit: Payer: Medicaid Other

## 2014-11-27 ENCOUNTER — Ambulatory Visit
Admission: RE | Admit: 2014-11-27 | Discharge: 2014-11-27 | Disposition: A | Discharge status: Home / Self Care | Payer: Medicaid Other | Source: Ambulatory Visit | Attending: Radiation Oncology | Admitting: Radiation Oncology

## 2014-11-27 ENCOUNTER — Other Ambulatory Visit: Payer: Self-pay | Admitting: *Deleted

## 2014-11-27 MED ORDER — ALPRAZOLAM 0.25 MG PO TABS: 0.2500 mg | ORAL_TABLET | Freq: Four times a day (QID) | ORAL | Status: DC | PRN

## 2014-11-28 ENCOUNTER — Ambulatory Visit: Payer: Medicaid Other

## 2014-11-28 NOTE — Progress Notes (Signed)
Patient ID: Elizabeth Flynn, female   DOB: [birth year], 44 y.o.   MRN: [id number] Patient called yesterday from parking lot of [place] at 11:30 a.m..  Very anxious about radiation simulation appointment.  Patient had appointment times confused, but when she found appointment card she saw that initial appointment was for 1:15p.m., not 10:15 a.m.  Radiation department willing to add her to morning schedule if she could wait, or come back at scheduled time. Patient states she is extremely anxious about radiation, and would like to have mastectomy instead.  States she has appointment with Dr. Jamal Collin tomorrow, and would like to discuss with him.  Notified Casper Harrison RN in radiation.  Reported patients anxiety and request for anti-anxiety medication to [facility].  Xanax 0.25 mg called in to patient's pharmacy. Oncology Nurse Navigator Documentation  Oncology Nurse Navigator Flowsheets 11/28/2014  Navigator Encounter Type Education;Other  Patient Visit Type Follow-up  Treatment Phase First Radiation Tx  Barriers/Navigation Needs Education  Education Understanding Cancer/ Treatment Options;Newly Diagnosed Cancer Education;Preparing for Upcoming Surgery/ Treatment;Concerns with Insurance Coverage;Concerns with Finances/ Eligibility  Interventions Referrals;Coordination of Care;Education Method  Coordination of Care MD Appointments  Education Method Verbal;Teach-back  Time Spent with Patient 30

## 2014-11-29 ENCOUNTER — Ambulatory Visit: Payer: Medicaid Other

## 2014-11-29 ENCOUNTER — Encounter: Payer: Self-pay | Admitting: General Surgery

## 2014-11-29 ENCOUNTER — Ambulatory Visit (INDEPENDENT_AMBULATORY_CARE_PROVIDER_SITE_OTHER): Payer: PRIVATE HEALTH INSURANCE | Admitting: General Surgery

## 2014-11-29 VITALS — BP 172/96 | HR 88 | Resp 16 | Ht 67.0 in | Wt 223.0 lb

## 2014-11-29 DIAGNOSIS — C50211 Malignant neoplasm of upper-inner quadrant of right female breast: Secondary | ICD-10-CM

## 2014-11-29 NOTE — Progress Notes (Deleted)
Here today for her post op right breast lumpectomy done on 10/23/14. Patient states she has a knot under her right arm but it is smaller. She wants to discuss surgery and possiblemastectomy. She is here today with her mother.

## 2014-11-29 NOTE — Progress Notes (Signed)
Patient ID: Elizabeth Flynn, female   DOB: [birth year], 44 y.o.   MRN: [id number]  Chief Complaint  Patient presents with  . Follow-up    HPI Elizabeth Flynn is a 44 y.o. female.  Here today for her post op right breast lumpectomy done on 10/23/14. Patient states she has a knot under her right arm but it is smaller. She wants to discuss surgery and possible mastectomy.She did not start her radiation as planned-does not want radiation. She is here today with her mother.  HPI  Past Medical History  Diagnosis Date  . Kidney stones   . Anemia   . Ovarian cyst   . Blood transfusion without reported diagnosis   . Hypertension   . Stroke     Sept. 2014  . Cancer of right breast 10/16/2014    Upper and inner quadrant right breast, Invasive ductal carcinoma, Atypical Ductal Hyperplasia, ER/PR Pos, Her 2 Neg  . Anxiety   . Headache   . MRSA infection 2009  . Dysrhythmia     TACHYCARDIA  . Enlarged heart   . BRCA negative 10-16-14    Past Surgical History  Procedure Laterality Date  . Cholecystectomy    . Tonsillectomy    . Hernia repair    . Breast biopsy Right 10/10/2014  . Tubal ligation    . Lithotripsy    . Breast lumpectomy with sentinel lymph node biopsy Right 10/23/2014    Procedure: Right breast lumpectomy with sentinel node biopsy ;  Surgeon: Christene Lye, MD;  Location: [facility] ORS;  Service: General;  Laterality: Right;    Family History  Problem Relation Age of Onset  . Breast cancer Cousin 85    Social History Social History  Substance Use Topics  . Smoking status: Former Smoker    Types: Cigarettes  . Smokeless tobacco: None  . Alcohol Use: No     Comment: SOBER FOR 3 [id number])    Allergies  Allergen Reactions  . Hydrocodone-Acetaminophen Hives  . Hydrocodone Itching  . Reglan [Metoclopramide] Other (See Comments)    "freaks me out, makes me nervous"  . Clindamycin/Lincomycin Rash  . Morphine And Related Rash    Current Outpatient  Prescriptions  Medication Sig Dispense Refill  . acetaminophen (TYLENOL) 500 MG tablet Take 1,500 mg by mouth every 6 (six) hours as needed. For pain    . ALPRAZolam (XANAX) 0.25 MG tablet Take 1 tablet (0.25 mg total) by mouth every 6 (six) hours as needed for anxiety. 30 tablet 0  . aspirin 81 MG tablet Take 81 mg by mouth daily.    . DimenhyDRINATE (DRAMAMINE PO) Take 1 tablet by mouth as needed.    . Iron TABS Take 1 tablet by mouth daily.    Marland Kitchen lamoTRIgine (LAMICTAL) 25 MG tablet Take 25 mg by mouth at bedtime.     Marland Kitchen lisinopril (PRINIVIL,ZESTRIL) 10 MG tablet Take 10 mg by mouth every evening.     . vitamin C (ASCORBIC ACID) 500 MG tablet Take 500 mg by mouth daily.     No current facility-administered medications for this visit.    Review of Systems Review of Systems  Constitutional: Negative.   Respiratory: Negative.   Cardiovascular: Negative.     Blood pressure 172/96, pulse 88, resp. rate 16, height 5' 7"  (1.702 m), weight 223 lb (101.152 kg), last menstrual period 11/15/2014.  Physical Exam Physical Exam  Constitutional: She is oriented to person, place, and time. She appears well-developed and well-nourished.  HENT:  Mouth/Throat: Oropharynx is clear and moist.  Eyes: Conjunctivae are normal. No scleral icterus.  Neck: Neck supple.  Cardiovascular: Normal rate, regular rhythm and normal heart sounds.   Pulmonary/Chest: Effort normal and breath sounds normal.  Right breast incision well healed.  Abdominal: Soft.  Lymphadenopathy:    She has no cervical adenopathy.  Neurological: She is alert and oriented to person, place, and time.  Skin: Skin is warm and dry.  Psychiatric: Her behavior is normal.    Data Reviewed Progress notes.  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    Plan   Had lengthy discussion with pt regarding pros and cons of mastectomy, outcomes comparing that to lumpectomy/radiation. Pt has made up  her mind not to have radiation. She would like to have mastectomy and will consider reconstruction later on if she chooses to. Exlained procedure, risks and benefits in full. Her decision also discussed with Dr. Oliva Bustard.  Patient's surgery has been scheduled for 12-05-14 at [facility]. It is okay for patient to continue 81 mg aspirin once daily.      PCP:  Estell Harpin Dr. Earmon Phoenix G 11/29/2014, 4:44 PM

## 2014-11-29 NOTE — Patient Instructions (Addendum)
Continue self breast exams. Call office for any new breast issues or concerns. Mastectomy, With or Without Reconstruction Mastectomy (removal of the breast) is a procedure most commonly used to treat cancer (tumor) of the breast. Different procedures are available for treatment. This depends on the stage of the tumor (abnormal growths). Discuss this with your caregiver, surgeon (a specialist for performing operations such as this), or oncologist (someone specialized in the treatment of cancer). With proper information, you can decide which treatment is best for you. Although the sound of the word cancer is frightening to all of [place], the new treatments and medications can be a source of reassurance and comfort. If there are things you are worried about, discuss them with your caregiver. He or she can help comfort you and your family. Some of the different procedures for treating breast cancer are:  Radical (extensive) mastectomy. This is an operation used to remove the entire breast, the muscles under the breast, and all of the glands (lymph nodes) under the arm. With all of the new treatments available for cancer of the breast, this procedure has become less common.  Modified radical mastectomy. This is a similar operation to the radical mastectomy described above. In the modified radical mastectomy, the muscles of the chest wall are not removed unless one of the lessor muscles is removed. One of the lessor muscles may be removed to allow better removal of the lymph nodes. The axillary lymph nodes are also removed. Rarely, during an axillary node dissection nerves to this area are damaged. Radiation therapy is then often used to the area following this surgery.  A total mastectomy also known as a complete or simple mastectomy. It involves removal of only the breast. The lymph nodes and the muscles are left in place.  In a lumpectomy, the lump is removed from the breast. This is the simplest form of surgical  treatment. A sentinel lymph node biopsy may also be done. Additional treatment may be required. RISKS AND COMPLICATIONS The main problems that follow removal of the breast include:  Infection (germs start growing in the wound). This can usually be treated with antibiotics (medications that kill germs).  Lymphedema. This means the arm on the side of the breast that was operated on swells because the lymph (tissue fluid) cannot follow the main channels back into the body. This only occurs when the lymph nodes have had to be removed under the arm.  There may be some areas of numbness to the upper arm and around the incision (cut by the surgeon) in the breast. This happens because of the cutting of or damage to some of the nerves in the area. This is most often unavoidable.  There may be difficulty moving the arm in a full range of motion (moving in all directions) following surgery. This usually improves with time following use and exercise.  Recurrence of breast cancer may happen with the very best of surgery and follow up treatment. Sometimes small cancer cells that cannot be seen with the naked eye have already spread at the time of surgery. When this happens other treatment is available. This treatment may be radiation, medications or a combination of both. RECONSTRUCTION Reconstruction of the breast may be done immediately if there is not going to be post-operative radiation. This surgery is done for cosmetic (improve appearance) purposes to improve the physical appearance after the operation. This may be done in two ways:  It can be done using a saline filled prosthetic (an  artificial breast which is filled with salt water). Silicone breast implants are now re-approved by the FDA and are being commonly used.  Reconstruction can be done using the body's own muscle/fat/skin. Your caregiver will discuss your options with you. Depending upon your needs or choice, together you will be able to  determine which procedure is best for you. Document Released: 12/16/2000 Document Revised: 12/16/2011 Document Reviewed: 08/09/2007 [facility] Patient Information 2015 [place], [place]. This information is not intended to replace advice given to you by your health care provider. Make sure you discuss any questions you have with your health care provider.  Patient's surgery has been scheduled for 12-05-14 at [facility].

## 2014-11-30 ENCOUNTER — Inpatient Hospital Stay: Admission: RE | Admit: 2014-11-30 | Payer: Self-pay | Source: Ambulatory Visit

## 2014-11-30 ENCOUNTER — Ambulatory Visit: Payer: Medicaid Other

## 2014-11-30 ENCOUNTER — Encounter: Payer: Self-pay | Admitting: *Deleted

## 2014-11-30 NOTE — Patient Instructions (Signed)
  Your procedure is scheduled on: 12-05-14 Report to [place] To find out your arrival time please call [phone] between 1PM - 3PM on 12-04-14  Remember: Instructions that are not followed completely may result in serious medical risk, up to and including death, or upon the discretion of your surgeon and anesthesiologist your surgery may need to be rescheduled.    _X___ 1. Do not eat food or drink liquids after midnight. No gum chewing or hard candies.     _X___ 2. No Alcohol for 24 hours before or after surgery.   ____ 3. Bring all medications with you on the day of surgery if instructed.    ____ 4. Notify your doctor if there is any change in your medical condition     (cold, fever, infections).     Do not wear jewelry, make-up, hairpins, clips or nail polish.  Do not wear lotions, powders, or perfumes. You may wear deodorant.  Do not shave 48 hours prior to surgery. Men may shave face and neck.  Do not bring valuables to the hospital.    [facility] is not responsible for any belongings or valuables.               Contacts, dentures or bridgework may not be worn into surgery.  Leave your suitcase in the car. After surgery it may be brought to your room.  For patients admitted to the hospital, discharge time is determined by your treatment team.   Patients discharged the day of surgery will not be allowed to drive home.   Please read over the following fact sheets that you were given:      _X___ Take these medicines the morning of surgery with A SIP OF WATER:    1. XANAX  2.   3.   4.  5.  6.  ____ Fleet Enema (as directed)   ____ Use CHG Soap as directed  ____ Use inhalers on the day of surgery  ____ Stop metformin 2 days prior to surgery    ____ Take 1/2 of usual insulin dose the night before surgery and none on the morning of surgery.   ____ Stop Coumadin/Plavix/aspirin-PT STOPPED IN JULY  ____ Stop Anti-inflammatories-NO  NSAIDS OR ASA PRODUCTS-TYLENOL OK   __X__ Stop supplements until after surgery-STOP VITAMIN C NOW   ____ Bring C-Pap to the hospital.

## 2014-12-03 ENCOUNTER — Ambulatory Visit: Payer: Medicaid Other

## 2014-12-04 ENCOUNTER — Ambulatory Visit: Payer: Medicaid Other

## 2014-12-05 ENCOUNTER — Other Ambulatory Visit: Payer: Self-pay | Admitting: General Surgery

## 2014-12-05 ENCOUNTER — Encounter
Admission: RE | Disposition: A | Discharge status: Home / Self Care | Payer: Self-pay | Source: Ambulatory Visit | Attending: General Surgery

## 2014-12-05 ENCOUNTER — Encounter: Payer: Self-pay | Admitting: Anesthesiology

## 2014-12-05 ENCOUNTER — Ambulatory Visit: Payer: Medicaid Other | Admitting: Anesthesiology

## 2014-12-05 ENCOUNTER — Ambulatory Visit
Admission: RE | Admit: 2014-12-05 | Discharge: 2014-12-05 | Disposition: A | Discharge status: Home / Self Care | Payer: Medicaid Other | Source: Ambulatory Visit | Attending: General Surgery | Admitting: General Surgery

## 2014-12-05 ENCOUNTER — Inpatient Hospital Stay: Payer: Medicaid Other

## 2014-12-05 ENCOUNTER — Ambulatory Visit: Payer: Medicaid Other

## 2014-12-05 ENCOUNTER — Telehealth: Payer: Self-pay | Admitting: *Deleted

## 2014-12-05 DIAGNOSIS — D649 Anemia, unspecified: Secondary | ICD-10-CM | POA: Diagnosis not present

## 2014-12-05 DIAGNOSIS — F419 Anxiety disorder, unspecified: Secondary | ICD-10-CM | POA: Diagnosis not present

## 2014-12-05 DIAGNOSIS — Z79899 Other long term (current) drug therapy: Secondary | ICD-10-CM | POA: Diagnosis not present

## 2014-12-05 DIAGNOSIS — Z803 Family history of malignant neoplasm of breast: Secondary | ICD-10-CM | POA: Diagnosis not present

## 2014-12-05 DIAGNOSIS — Z87442 Personal history of urinary calculi: Secondary | ICD-10-CM | POA: Insufficient documentation

## 2014-12-05 DIAGNOSIS — Z881 Allergy status to other antibiotic agents status: Secondary | ICD-10-CM | POA: Insufficient documentation

## 2014-12-05 DIAGNOSIS — N6011 Diffuse cystic mastopathy of right breast: Secondary | ICD-10-CM | POA: Insufficient documentation

## 2014-12-05 DIAGNOSIS — Z8614 Personal history of Methicillin resistant Staphylococcus aureus infection: Secondary | ICD-10-CM | POA: Insufficient documentation

## 2014-12-05 DIAGNOSIS — C50211 Malignant neoplasm of upper-inner quadrant of right female breast: Secondary | ICD-10-CM | POA: Insufficient documentation

## 2014-12-05 DIAGNOSIS — Z170 Estrogen receptor positive status [ER+]: Secondary | ICD-10-CM | POA: Diagnosis not present

## 2014-12-05 DIAGNOSIS — Z888 Allergy status to other drugs, medicaments and biological substances status: Secondary | ICD-10-CM | POA: Diagnosis not present

## 2014-12-05 DIAGNOSIS — Z885 Allergy status to narcotic agent status: Secondary | ICD-10-CM | POA: Insufficient documentation

## 2014-12-05 DIAGNOSIS — Z8673 Personal history of transient ischemic attack (TIA), and cerebral infarction without residual deficits: Secondary | ICD-10-CM | POA: Insufficient documentation

## 2014-12-05 DIAGNOSIS — Z7982 Long term (current) use of aspirin: Secondary | ICD-10-CM | POA: Insufficient documentation

## 2014-12-05 DIAGNOSIS — Z87891 Personal history of nicotine dependence: Secondary | ICD-10-CM | POA: Insufficient documentation

## 2014-12-05 DIAGNOSIS — I10 Essential (primary) hypertension: Secondary | ICD-10-CM | POA: Diagnosis not present

## 2014-12-05 HISTORY — DX: Nausea with vomiting, unspecified: Z98.890

## 2014-12-05 HISTORY — DX: Nausea with vomiting, unspecified: R11.2

## 2014-12-05 HISTORY — PX: MASTECTOMY MODIFIED RADICAL: SHX5962

## 2014-12-05 LAB — POCT PREGNANCY, URINE: Preg Test, Ur: NEGATIVE

## 2014-12-05 SURGERY — MASTECTOMY, MODIFIED RADICAL
Anesthesia: General | Site: Breast | Laterality: Right | Wound class: Clean

## 2014-12-05 MED ORDER — LIDOCAINE HCL (CARDIAC) 20 MG/ML IV SOLN
INTRAVENOUS | Status: DC | PRN
Start: 2014-12-05 — End: 2014-12-05
  Administered 2014-12-05: 30 mg via INTRAVENOUS

## 2014-12-05 MED ORDER — HYDROMORPHONE HCL 1 MG/ML IJ SOLN
0.2500 mg | INTRAMUSCULAR | Status: DC | PRN
  Administered 2014-12-05 (×4): 0.5 mg via INTRAVENOUS

## 2014-12-05 MED ORDER — PROPOFOL 10 MG/ML IV BOLUS
INTRAVENOUS | Status: DC | PRN
  Administered 2014-12-05: 150 mg via INTRAVENOUS
  Administered 2014-12-05: 20 mg via INTRAVENOUS

## 2014-12-05 MED ORDER — MIDAZOLAM HCL 2 MG/2ML IJ SOLN
INTRAMUSCULAR | Status: DC | PRN
  Administered 2014-12-05: 2 mg via INTRAVENOUS

## 2014-12-05 MED ORDER — ROCURONIUM BROMIDE 100 MG/10ML IV SOLN
INTRAVENOUS | Status: DC | PRN
  Administered 2014-12-05: 40 mg via INTRAVENOUS

## 2014-12-05 MED ORDER — KETOROLAC TROMETHAMINE 30 MG/ML IJ SOLN
INTRAMUSCULAR | Status: DC | PRN
  Administered 2014-12-05: 30 mg via INTRAVENOUS

## 2014-12-05 MED ORDER — NEOSTIGMINE METHYLSULFATE 10 MG/10ML IV SOLN
INTRAVENOUS | Status: DC | PRN
  Administered 2014-12-05: 3 mg via INTRAVENOUS

## 2014-12-05 MED ORDER — LACTATED RINGERS IV SOLN
INTRAVENOUS | Status: DC
  Administered 2014-12-05: 11:00:00 via INTRAVENOUS

## 2014-12-05 MED ORDER — EPHEDRINE SULFATE 50 MG/ML IJ SOLN
INTRAMUSCULAR | Status: DC | PRN
  Administered 2014-12-05: 10 mg via INTRAVENOUS

## 2014-12-05 MED ORDER — FENTANYL CITRATE (PF) 100 MCG/2ML IJ SOLN
INTRAMUSCULAR | Status: AC
  Administered 2014-12-05: 25 ug via INTRAVENOUS
  Filled 2014-12-05: qty 2

## 2014-12-05 MED ORDER — HYDROMORPHONE HCL 1 MG/ML IJ SOLN
INTRAMUSCULAR | Status: AC
  Administered 2014-12-05: 0.5 mg via INTRAVENOUS
  Filled 2014-12-05: qty 1

## 2014-12-05 MED ORDER — ONDANSETRON HCL 4 MG/2ML IJ SOLN: 4.0000 mg | Freq: Once | INTRAMUSCULAR | Status: DC | PRN

## 2014-12-05 MED ORDER — FENTANYL CITRATE (PF) 100 MCG/2ML IJ SOLN
INTRAMUSCULAR | Status: DC | PRN
  Administered 2014-12-05 (×4): 50 ug via INTRAVENOUS

## 2014-12-05 MED ORDER — PROMETHAZINE HCL 12.5 MG RE SUPP: 12.5000 mg | Freq: Four times a day (QID) | RECTAL | Status: DC | PRN

## 2014-12-05 MED ORDER — LABETALOL HCL 5 MG/ML IV SOLN
INTRAVENOUS | Status: DC | PRN
  Administered 2014-12-05: 10 mg via INTRAVENOUS

## 2014-12-05 MED ORDER — FENTANYL CITRATE (PF) 100 MCG/2ML IJ SOLN
25.0000 ug | INTRAMUSCULAR | Status: DC | PRN
  Administered 2014-12-05 (×4): 25 ug via INTRAVENOUS

## 2014-12-05 MED ORDER — OXYCODONE-ACETAMINOPHEN 5-325 MG PO TABS: 1.0000 | ORAL_TABLET | ORAL | Status: DC | PRN

## 2014-12-05 MED ORDER — CEFAZOLIN SODIUM-DEXTROSE 2-3 GM-% IV SOLR
INTRAVENOUS | Status: AC
  Filled 2014-12-05: qty 50

## 2014-12-05 MED ORDER — CHLORHEXIDINE GLUCONATE 4 % EX LIQD: 1.0000 "application " | Freq: Once | CUTANEOUS | Status: DC

## 2014-12-05 MED ORDER — FAMOTIDINE 20 MG PO TABS
20.0000 mg | ORAL_TABLET | Freq: Once | ORAL | Status: AC
  Administered 2014-12-05: 20 mg via ORAL

## 2014-12-05 MED ORDER — ACETAMINOPHEN 10 MG/ML IV SOLN
INTRAVENOUS | Status: AC
  Filled 2014-12-05: qty 100

## 2014-12-05 MED ORDER — ONDANSETRON HCL 4 MG/2ML IJ SOLN
INTRAMUSCULAR | Status: DC | PRN
  Administered 2014-12-05: 4 mg via INTRAVENOUS

## 2014-12-05 MED ORDER — GLYCOPYRROLATE 0.2 MG/ML IJ SOLN
INTRAMUSCULAR | Status: DC | PRN
  Administered 2014-12-05: 0.2 mg via INTRAVENOUS
  Administered 2014-12-05: 0.6 mg via INTRAVENOUS

## 2014-12-05 MED ORDER — CEFAZOLIN SODIUM-DEXTROSE 2-3 GM-% IV SOLR
2.0000 g | INTRAVENOUS | Status: AC
  Administered 2014-12-05: 2 g via INTRAVENOUS

## 2014-12-05 MED ORDER — LIDOCAINE HCL (PF) 1 % IJ SOLN
INTRAMUSCULAR | Status: AC
  Filled 2014-12-05: qty 2

## 2014-12-05 MED ORDER — FAMOTIDINE 20 MG PO TABS
ORAL_TABLET | ORAL | Status: AC
  Administered 2014-12-05: 20 mg via ORAL
  Filled 2014-12-05: qty 1

## 2014-12-05 MED ORDER — DEXAMETHASONE SODIUM PHOSPHATE 4 MG/ML IJ SOLN
INTRAMUSCULAR | Status: DC | PRN
  Administered 2014-12-05: 10 mg via INTRAVENOUS

## 2014-12-05 SURGICAL SUPPLY — 48 items
APPLIER CLIP 11 MED OPEN (CLIP)
APPLIER CLIP 13 LRG OPEN (CLIP)
BLADE SURG 10 STRL SS SAFETY (BLADE) ×3 IMPLANT
BULB RESERV EVAC DRAIN JP 100C (MISCELLANEOUS) IMPLANT
CANISTER SUCT 1200ML W/VALVE (MISCELLANEOUS) ×3 IMPLANT
CHLORAPREP W/TINT 26ML (MISCELLANEOUS) ×3 IMPLANT
CLIP APPLIE 11 MED OPEN (CLIP) IMPLANT
CLIP APPLIE 13 LRG OPEN (CLIP) IMPLANT
CLOSURE WOUND 1/2 X4 (GAUZE/BANDAGES/DRESSINGS)
DEVICE DISSECT PLASMABLAD 3.0S (MISCELLANEOUS) ×1 IMPLANT
DRAIN CHANNEL JP 15F RND 16 (MISCELLANEOUS) IMPLANT
DRAPE LAPAROTOMY TRNSV 106X77 (MISCELLANEOUS) ×3 IMPLANT
DRSG TELFA 3X8 NADH (GAUZE/BANDAGES/DRESSINGS) ×3 IMPLANT
ELECT CAUTERY BLADE 6.4 (BLADE) ×3 IMPLANT
GAUZE FLUFF 18X24 1PLY STRL (GAUZE/BANDAGES/DRESSINGS) ×3 IMPLANT
GAUZE SPONGE 4X4 12PLY STRL (GAUZE/BANDAGES/DRESSINGS) IMPLANT
GLOVE BIO SURGEON STRL SZ7 (GLOVE) ×6 IMPLANT
GLOVE INDICATOR 7.5 STRL GRN (GLOVE) ×6 IMPLANT
GOWN STRL REUS W/ TWL LRG LVL3 (GOWN DISPOSABLE) ×2 IMPLANT
GOWN STRL REUS W/TWL LRG LVL3 (GOWN DISPOSABLE) ×4
HARMONIC SCALPEL FOCUS (MISCELLANEOUS) IMPLANT
KIT RM TURNOVER STRD PROC AR (KITS) ×3 IMPLANT
LABEL OR SOLS (LABEL) IMPLANT
LIQUID BAND (GAUZE/BANDAGES/DRESSINGS) ×3 IMPLANT
MARKER SKIN W/RULER 31145785 (MISCELLANEOUS) ×3 IMPLANT
NS IRRIG 1000ML POUR BTL (IV SOLUTION) ×3 IMPLANT
PACK BASIN MINOR ARMC (MISCELLANEOUS) ×3 IMPLANT
PAD ABD DERMACEA PRESS 5X9 (GAUZE/BANDAGES/DRESSINGS) ×3 IMPLANT
PAD GROUND ADULT SPLIT (MISCELLANEOUS) ×6 IMPLANT
PIN SAFETY STRL (MISCELLANEOUS) IMPLANT
PLASMABLADE 3.0S (MISCELLANEOUS) ×3
SPONGE LAP 18X18 5 PK (GAUZE/BANDAGES/DRESSINGS) ×3 IMPLANT
STRIP CLOSURE SKIN 1/2X4 (GAUZE/BANDAGES/DRESSINGS) IMPLANT
SURGI-BRA LG (MISCELLANEOUS) ×3 IMPLANT
SUT ETHILON 2 0 FSLX (SUTURE) IMPLANT
SUT MNCRL AB 3-0 PS2 27 (SUTURE) ×3 IMPLANT
SUT SILK 2 0 (SUTURE)
SUT SILK 2-0 18XBRD TIE 12 (SUTURE) IMPLANT
SUT SILK 3 0 (SUTURE)
SUT SILK 3-0 18XBRD TIE 12 (SUTURE) IMPLANT
SUT SILK 4 0 (SUTURE)
SUT SILK 4-0 18XBRD TIE 12 (SUTURE) IMPLANT
SUT VIC AB 2-0 CT1 27 (SUTURE) ×2
SUT VIC AB 2-0 CT1 TAPERPNT 27 (SUTURE) ×1 IMPLANT
SUT VICRYL+ 3-0 144IN (SUTURE) ×3 IMPLANT
SWABSTK COMLB BENZOIN TINCTURE (MISCELLANEOUS) IMPLANT
TUBING CONNECTING 10 (TUBING) ×2 IMPLANT
TUBING CONNECTING 10' (TUBING) ×1

## 2014-12-05 NOTE — Progress Notes (Signed)
Patient left message that she met with Dr. Jamal Collin last Thursday, and is scheduled for mastectomy today.  Met with caregiver Arbie Cookey, while patient in surgery.  She states patient interested in reconstruction, and also needs a GYN consult for pelvic pain.  Will compile list of physicians who take Medicaid and take to patient.    Oncology Nurse Navigator Documentation    Navigator Encounter Type: Treatment (12/05/14 1100) Patient Visit Type: Surgery (12/05/14 1100)   Barriers/Navigation Needs: Family concerns;Education (12/05/14 1100) Education: Accessing Care/ Finding Providers;Coping with Diagnosis/ Prognosis;Understanding Cancer/ Treatment Options (12/05/14 1100) Interventions: Coordination of Care;Education Method (12/05/14 1100)   Coordination of Care: MD Appointments (12/05/14 1100) Education Method: Verbal;Written (12/05/14 1100)      Time Spent with Patient: 45 (12/05/14 1100)

## 2014-12-05 NOTE — Transfer of Care (Signed)
Immediate Anesthesia Transfer of Care Note  Patient: Elizabeth Flynn  Procedure(s) Performed: Procedure(s): MASTECTOMY MODIFIED RADICAL (Right)  Patient Location: PACU  Anesthesia Type:General  Level of Consciousness: awake, oriented and patient cooperative  Airway & Oxygen Therapy: Patient Spontanous Breathing and Patient connected to face mask oxygen  Post-op Assessment: Report given to RN  Post vital signs: Reviewed and stable  Last Vitals:  Filed Vitals:   12/05/14 1221  BP:   Pulse: 89  Temp: 37 C  Resp:     Complications: No apparent anesthesia complications

## 2014-12-05 NOTE — Anesthesia Postprocedure Evaluation (Signed)
  Anesthesia Post-op Note  Patient: Elizabeth Flynn  Procedure(s) Performed: Procedure(s): MASTECTOMY MODIFIED RADICAL (Right)  Anesthesia type:General  Patient location: PACU  Post pain: Pain level controlled  Post assessment: Post-op Vital signs reviewed, Patient's Cardiovascular Status Stable, Respiratory Function Stable, Patent Airway and No signs of Nausea or vomiting  Post vital signs: Reviewed and stable  Last Vitals:  Filed Vitals:   12/05/14 1409  BP: 144/68  Pulse: 50  Temp:   Resp: 16    Level of consciousness: awake, alert  and patient cooperative  Complications: No apparent anesthesia complications

## 2014-12-05 NOTE — Anesthesia Preprocedure Evaluation (Addendum)
Anesthesia Evaluation  Patient identified by MRN, date of birth, ID band Patient awake    Reviewed: Allergy & Precautions, NPO status , Patient's Chart, lab work & pertinent test results  History of Anesthesia Complications (+) PONV and history of anesthetic complications  Airway Mallampati: III  TM Distance: <3 FB     Dental  (+) Chipped   Pulmonary former smoker,    Pulmonary exam normal       Cardiovascular hypertension, Normal cardiovascular exam+ dysrhythmias     Neuro/Psych  Headaches, Anxiety Depression CVA, No Residual Symptoms    GI/Hepatic negative GI ROS, Neg liver ROS,   Endo/Other  negative endocrine ROS  Renal/GU Kidney stones  negative genitourinary   Musculoskeletal negative musculoskeletal ROS (+)   Abdominal Normal abdominal exam  (+)   Peds negative pediatric ROS (+)  Hematology  (+) anemia ,   Anesthesia Other Findings   Reproductive/Obstetrics                           Anesthesia Physical Anesthesia Plan  ASA: III  Anesthesia Plan: General   Post-op Pain Management:    Induction: Intravenous  Airway Management Planned: Oral ETT  Additional Equipment:   Intra-op Plan:   Post-operative Plan: Extubation in OR  Informed Consent: I have reviewed the patients History and Physical, chart, labs and discussed the procedure including the risks, benefits and alternatives for the proposed anesthesia with the patient or authorized representative who has indicated his/her understanding and acceptance.   Dental advisory given  Plan Discussed with: CRNA and Surgeon  Anesthesia Plan Comments:         Anesthesia Quick Evaluation

## 2014-12-05 NOTE — Progress Notes (Signed)
Dr Jamal Collin in to see pt - will call in a prescription for phenergan for pt to take at home for continued nausea.

## 2014-12-05 NOTE — Interval H&P Note (Signed)
History and Physical Interval Note:  12/05/2014 10:05 AM  Elizabeth Flynn  has presented today for surgery, with the diagnosis of CANCER RIGHT BREAST  The various methods of treatment have been discussed with the patient and family. After consideration of risks, benefits and other options for treatment, the patient has consented to  Procedure(s): MASTECTOMY MODIFIED RADICAL (Right) as a surgical intervention .  The patient's history has been reviewed, patient examined, no change in status, stable for surgery.  I have reviewed the patient's chart and labs.  Questions were answered to the patient's satisfaction.     SANKAR,SEEPLAPUTHUR G

## 2014-12-05 NOTE — Discharge Instructions (Signed)
AMBULATORY SURGERY  DISCHARGE INSTRUCTIONS   1) The drugs that you were given will stay in your system until tomorrow so for the next 24 hours you should not:  A) Drive an automobile B) Make any legal decisions C) Drink any alcoholic beverage   2) You may resume regular meals tomorrow.  Today it is better to start with liquids and gradually work up to solid foods.  You may eat anything you prefer, but it is better to start with liquids, then soup and crackers, and gradually work up to solid foods.   3) Please notify your doctor immediately if you have any unusual bleeding, trouble breathing, redness and pain at the surgery site, drainage, fever, or pain not relieved by medication.  4) Your post-operative visit with Dr.                                     is: Date:                        Time:    Please call to schedule your post-operative visit.  5) Additional Instructions: 6)

## 2014-12-05 NOTE — H&P (View-Only) (Signed)
Patient ID: Elizabeth Flynn, female   DOB: May 04, 1970, 44 y.o.   MRN: [id number]  Chief Complaint  Patient presents with  . Follow-up    HPI Elizabeth Flynn is a 44 y.o. female.  Here today for her post op right breast lumpectomy done on 10/23/14. Patient states she has a knot under her right arm but it is smaller. She wants to discuss surgery and possible mastectomy.She did not start her radiation as planned-does not want radiation. She is here today with her mother.  HPI  Past Medical History  Diagnosis Date  . Kidney stones   . Anemia   . Ovarian cyst   . Blood transfusion without reported diagnosis   . Hypertension   . Stroke     Sept. 2014  . Cancer of right breast 10/16/2014    Upper and inner quadrant right breast, Invasive ductal carcinoma, Atypical Ductal Hyperplasia, ER/PR Pos, Her 2 Neg  . Anxiety   . Headache   . MRSA infection 2009  . Dysrhythmia     TACHYCARDIA  . Enlarged heart   . BRCA negative 10-16-14    Past Surgical History  Procedure Laterality Date  . Cholecystectomy    . Tonsillectomy    . Hernia repair    . Breast biopsy Right 10/10/2014  . Tubal ligation    . Lithotripsy    . Breast lumpectomy with sentinel lymph node biopsy Right 10/23/2014    Procedure: Right breast lumpectomy with sentinel node biopsy ;  Surgeon: Christene Lye, MD;  Location: [facility] ORS;  Service: General;  Laterality: Right;    Family History  Problem Relation Age of Onset  . Breast cancer Cousin 13    Social History Social History  Substance Use Topics  . Smoking status: Former Smoker    Types: Cigarettes  . Smokeless tobacco: None  . Alcohol Use: No     Comment: SOBER FOR 3 [id number])    Allergies  Allergen Reactions  . Hydrocodone-Acetaminophen Hives  . Hydrocodone Itching  . Reglan [Metoclopramide] Other (See Comments)    "freaks me out, makes me nervous"  . Clindamycin/Lincomycin Rash  . Morphine And Related Rash    Current Outpatient  Prescriptions  Medication Sig Dispense Refill  . acetaminophen (TYLENOL) 500 MG tablet Take 1,500 mg by mouth every 6 (six) hours as needed. For pain    . ALPRAZolam (XANAX) 0.25 MG tablet Take 1 tablet (0.25 mg total) by mouth every 6 (six) hours as needed for anxiety. 30 tablet 0  . aspirin 81 MG tablet Take 81 mg by mouth daily.    . DimenhyDRINATE (DRAMAMINE PO) Take 1 tablet by mouth as needed.    . Iron TABS Take 1 tablet by mouth daily.    Marland Kitchen lamoTRIgine (LAMICTAL) 25 MG tablet Take 25 mg by mouth at bedtime.     Marland Kitchen lisinopril (PRINIVIL,ZESTRIL) 10 MG tablet Take 10 mg by mouth every evening.     . vitamin C (ASCORBIC ACID) 500 MG tablet Take 500 mg by mouth daily.     No current facility-administered medications for this visit.    Review of Systems Review of Systems  Constitutional: Negative.   Respiratory: Negative.   Cardiovascular: Negative.     Blood pressure 172/96, pulse 88, resp. rate 16, height 5' 7"  (1.702 m), weight 223 lb (101.152 kg), last menstrual period 11/15/2014.  Physical Exam Physical Exam  Constitutional: She is oriented to person, place, and time. She appears well-developed and well-nourished.  HENT:  Mouth/Throat: Oropharynx is clear and moist.  Eyes: Conjunctivae are normal. No scleral icterus.  Neck: Neck supple.  Cardiovascular: Normal rate, regular rhythm and normal heart sounds.   Pulmonary/Chest: Effort normal and breath sounds normal.  Right breast incision well healed.  Abdominal: Soft.  Lymphadenopathy:    She has no cervical adenopathy.  Neurological: She is alert and oriented to person, place, and time.  Skin: Skin is warm and dry.  Psychiatric: Her behavior is normal.    Data Reviewed Progress notes.  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    Plan   Had lengthy discussion with pt regarding pros and cons of mastectomy, outcomes comparing that to lumpectomy/radiation. Pt has made up  her mind not to have radiation. She would like to have mastectomy and will consider reconstruction later on if she chooses to. Exlained procedure, risks and benefits in full. Her decision also discussed with Dr. Oliva Bustard.  Patient's surgery has been scheduled for 12-05-14 at [facility]. It is okay for patient to continue 81 mg aspirin once daily.      PCP:  Estell Harpin Dr. Earmon Phoenix G 11/29/2014, 4:44 PM

## 2014-12-05 NOTE — Telephone Encounter (Signed)
RX sent to [facility] for phenergan supp. #12

## 2014-12-05 NOTE — Op Note (Signed)
Preop diagnosis: Carcinoma right breast  Post op diagnosis: Same  Operation: Right total mastectomy  Surgeon: S.G.Amilyah Nack  Assistant:     Anesthesia: Gen.  Complications: None  EBL: 25-50 mL  Drains: Jackson-Pratt drain  Description: Patient was put to sleep in supine position the operating table in the right breast was prepped and draped sterile field. This patient had already undergone lumpectomy and sentinel node biopsy for small cancer in the upper inner quadrant. Patient subsequently changed her mind about radiation and instead opted to have a mastectomy. Timeout was performed. Elliptical skin incision around the breast from medial to lateral aspect was mapped out and using a plasma knife the incision was made. Superior flap was then created towards the infraclavicular region and the inferior flap to the upper rectus fascia bleeders were controlled with the plasma knife alone with good control noted. The breast along with the underlying pectoral fascia was then dissected off from the underlying pectoralis major muscle the medial to lateral aspect and the lateral attachments were then taken down and freed. Lateral end of the breast was tagged and sent to pathology. The wound was irrigated and after ensuring hemostasis was drained with a 19-gauge Jackson-Pratt drain brought out through a stab incision inferiorly. Drain was fastened to the skin with a 3-0 nylon stitch the subcutaneous tissue was approximated with interrupted stitches of 2-0 Vicryl. The skin was then approximated with a running subcuticular stitch of 3-0 Monocryl. Liqui ban was applied. Dressing was with Telfa fluffs and ABDs and surgical bra. Patient subsequently was extubated and returned recovery room in stable condition.

## 2014-12-05 NOTE — Anesthesia Procedure Notes (Signed)
Procedure Name: Intubation Date/Time: 12/05/2014 11:56 AM Performed by: Courtney Paris Pre-anesthesia Checklist: Patient identified, Emergency Drugs available, Suction available and Patient being monitored Patient Re-evaluated:Patient Re-evaluated prior to inductionOxygen Delivery Method: Circle system utilized Preoxygenation: Pre-oxygenation with 100% oxygen Intubation Type: IV induction and Combination inhalational/ intravenous induction Ventilation: Mask ventilation without difficulty Laryngoscope Size: Miller and 3 Grade View: Grade II Tube type: Oral Tube size: 7.0 mm Number of attempts: 1 Airway Equipment and Method: Stylet Placement Confirmation: ETT inserted through vocal cords under direct vision,  positive ETCO2,  CO2 detector and breath sounds checked- equal and bilateral Secured at: 21 cm Tube secured with: Tape Dental Injury: Teeth and Oropharynx as per pre-operative assessment

## 2014-12-06 ENCOUNTER — Telehealth: Payer: Self-pay

## 2014-12-06 ENCOUNTER — Ambulatory Visit: Payer: Medicaid Other

## 2014-12-06 NOTE — Telephone Encounter (Signed)
  Oncology Nurse Navigator Documentation    Navigator Encounter Type: Telephone (12/06/14 0900) Patient Visit Type: Follow-up;Surgery (12/06/14 0900) Treatment Phase: Other (12/06/14 0900) Barriers/Navigation Needs: Financial (12/06/14 0900) Education: Accessing Care/ Finding Providers (12/06/14 0900) Interventions: Coordination of Care (12/06/14 0900)   Coordination of Care: MD Appointments;Other (12/06/14 0900) Education Method: Verbal (12/06/14 0900)      Time Spent with Patient: 15 (12/06/14 0900)  Phoned patient to follow-up post mastectomy performed 12/05/14.  Patient requesting information on oncoplastic surgeon who will accept Medicaid.  She is interested in breast reconstruction.  Given name and number of plastic surgeon.

## 2014-12-07 ENCOUNTER — Ambulatory Visit (INDEPENDENT_AMBULATORY_CARE_PROVIDER_SITE_OTHER): Payer: PRIVATE HEALTH INSURANCE

## 2014-12-07 ENCOUNTER — Ambulatory Visit: Payer: Medicaid Other

## 2014-12-07 DIAGNOSIS — C50211 Malignant neoplasm of upper-inner quadrant of right female breast: Secondary | ICD-10-CM

## 2014-12-07 LAB — SURGICAL PATHOLOGY

## 2014-12-07 NOTE — Progress Notes (Signed)
Patient ID: Elizabeth Flynn, female   DOB: 1970-10-20, 44 y.o.   MRN: [id number] Patient came in today for a wound check.  The mastectomy is clean, with no signs of infection noted. Dressing changed, three steri strips applied. Drain is patient. Follow up as scheduled.

## 2014-12-11 ENCOUNTER — Ambulatory Visit: Payer: Medicaid Other

## 2014-12-11 ENCOUNTER — Telehealth: Payer: Self-pay | Admitting: Oncology

## 2014-12-12 ENCOUNTER — Encounter: Payer: Self-pay | Admitting: General Surgery

## 2014-12-12 ENCOUNTER — Ambulatory Visit (INDEPENDENT_AMBULATORY_CARE_PROVIDER_SITE_OTHER): Payer: PRIVATE HEALTH INSURANCE | Admitting: General Surgery

## 2014-12-12 ENCOUNTER — Ambulatory Visit: Payer: Medicaid Other

## 2014-12-12 VITALS — BP 132/76 | HR 78 | Resp 16 | Ht 67.0 in | Wt 218.0 lb

## 2014-12-12 DIAGNOSIS — C50211 Malignant neoplasm of upper-inner quadrant of right female breast: Secondary | ICD-10-CM

## 2014-12-12 MED ORDER — TRAMADOL HCL 50 MG PO TABS: 50.0000 mg | ORAL_TABLET | Freq: Four times a day (QID) | ORAL | Status: DC | PRN

## 2014-12-12 NOTE — Progress Notes (Signed)
44 y.o female here today to follow up from her right mastecotmy done on 12/05/14, still in pain. She has her drain sheet present.  Mastectomy incision healing well, still draining more than 60 ml a day. Drainage is serous. Draining over 60 ml per day. Dressing changed. She may shower. Recheck in 1 week

## 2014-12-13 ENCOUNTER — Ambulatory Visit: Payer: Medicaid Other

## 2014-12-14 ENCOUNTER — Ambulatory Visit: Payer: Medicaid Other

## 2014-12-17 ENCOUNTER — Ambulatory Visit: Payer: Medicaid Other

## 2014-12-18 ENCOUNTER — Ambulatory Visit: Payer: Medicaid Other

## 2014-12-18 ENCOUNTER — Ambulatory Visit (INDEPENDENT_AMBULATORY_CARE_PROVIDER_SITE_OTHER): Payer: Medicaid Other | Admitting: General Surgery

## 2014-12-18 ENCOUNTER — Encounter: Payer: Self-pay | Admitting: General Surgery

## 2014-12-18 VITALS — BP 132/68 | HR 74 | Resp 14 | Ht 66.0 in | Wt 218.0 lb

## 2014-12-18 DIAGNOSIS — C50211 Malignant neoplasm of upper-inner quadrant of right female breast: Secondary | ICD-10-CM

## 2014-12-18 NOTE — Progress Notes (Signed)
44 year old female follow up for breast cancer. Patient had a right mastectomy done on 12/05/14. Patient brought drain sheet, still draining about 60cc a day. Drains more when she increases her activity.  Incision clean and healing well, drain bulb replaced.   Return in 10 days.

## 2014-12-19 ENCOUNTER — Inpatient Hospital Stay: Payer: Medicaid Other | Attending: Oncology

## 2014-12-19 ENCOUNTER — Ambulatory Visit: Payer: Medicaid Other

## 2014-12-20 ENCOUNTER — Ambulatory Visit: Payer: Medicaid Other

## 2014-12-21 ENCOUNTER — Ambulatory Visit: Payer: Medicaid Other

## 2014-12-24 ENCOUNTER — Ambulatory Visit: Payer: Medicaid Other

## 2014-12-25 ENCOUNTER — Ambulatory Visit: Payer: Medicaid Other

## 2014-12-25 ENCOUNTER — Telehealth: Payer: Self-pay | Admitting: *Deleted

## 2014-12-25 NOTE — Telephone Encounter (Signed)
Patient still has drain and now its hurting and its red. Its really bothering her. She does have an appointment to come in to see Dr.Sankar in the morning but she just wants to know what she can do till then. She also stated that she is still draining 30cc everyday.

## 2014-12-25 NOTE — Telephone Encounter (Signed)
Advised to apply neosporin to the site. May use heat or ice for comfort. May be able to remove tomorrow. Follow up as scheduled.

## 2014-12-26 ENCOUNTER — Encounter: Payer: Self-pay | Admitting: General Surgery

## 2014-12-26 ENCOUNTER — Ambulatory Visit (INDEPENDENT_AMBULATORY_CARE_PROVIDER_SITE_OTHER): Payer: Medicaid Other | Admitting: General Surgery

## 2014-12-26 ENCOUNTER — Ambulatory Visit: Payer: Medicaid Other

## 2014-12-26 VITALS — BP 130/80 | HR 74 | Resp 14 | Ht 66.0 in | Wt 218.0 lb

## 2014-12-26 DIAGNOSIS — C50211 Malignant neoplasm of upper-inner quadrant of right female breast: Secondary | ICD-10-CM

## 2014-12-26 NOTE — Patient Instructions (Signed)
Follow up in 1 week. Call with any questions.

## 2014-12-26 NOTE — Progress Notes (Signed)
This is a 44 year old female follow up for breast cancer. Patient had a right mastectomy done on 12/05/14. Patient brought drain sheet. Patient states the drain site is red and has been hurting. She reports a clot this morning from the drain site. She also reports some axillary swelling. She reports the adhesive tape is irritating her skin.   Exam: No signs of infection.  Plan: Drain removed today. Follow up in 2 weeks.

## 2014-12-27 ENCOUNTER — Ambulatory Visit: Payer: Medicaid Other

## 2014-12-28 ENCOUNTER — Ambulatory Visit: Payer: Medicaid Other

## 2014-12-31 ENCOUNTER — Ambulatory Visit: Payer: Medicaid Other

## 2015-01-01 ENCOUNTER — Ambulatory Visit: Payer: Medicaid Other

## 2015-01-02 ENCOUNTER — Encounter: Payer: Self-pay | Admitting: General Surgery

## 2015-01-02 ENCOUNTER — Ambulatory Visit: Payer: Medicaid Other

## 2015-01-03 ENCOUNTER — Ambulatory Visit: Payer: Medicaid Other

## 2015-01-04 ENCOUNTER — Ambulatory Visit: Payer: Medicaid Other

## 2015-01-07 ENCOUNTER — Encounter: Payer: Self-pay | Admitting: Oncology

## 2015-01-07 ENCOUNTER — Inpatient Hospital Stay: Payer: Medicaid Other | Attending: Oncology | Admitting: Oncology

## 2015-01-07 ENCOUNTER — Ambulatory Visit: Payer: Medicaid Other

## 2015-01-07 VITALS — BP 168/89 | HR 71 | Temp 98.3°F | Wt 215.0 lb

## 2015-01-07 DIAGNOSIS — Z170 Estrogen receptor positive status [ER+]: Secondary | ICD-10-CM | POA: Insufficient documentation

## 2015-01-07 DIAGNOSIS — Z7982 Long term (current) use of aspirin: Secondary | ICD-10-CM | POA: Diagnosis not present

## 2015-01-07 DIAGNOSIS — Z79899 Other long term (current) drug therapy: Secondary | ICD-10-CM | POA: Insufficient documentation

## 2015-01-07 DIAGNOSIS — Z9011 Acquired absence of right breast and nipple: Secondary | ICD-10-CM | POA: Diagnosis not present

## 2015-01-07 DIAGNOSIS — Z87891 Personal history of nicotine dependence: Secondary | ICD-10-CM | POA: Diagnosis not present

## 2015-01-07 DIAGNOSIS — I10 Essential (primary) hypertension: Secondary | ICD-10-CM | POA: Diagnosis not present

## 2015-01-07 DIAGNOSIS — C50911 Malignant neoplasm of unspecified site of right female breast: Secondary | ICD-10-CM

## 2015-01-07 MED ORDER — TAMOXIFEN CITRATE 20 MG PO TABS: 20.0000 mg | ORAL_TABLET | Freq: Every day | ORAL | Status: DC

## 2015-01-07 NOTE — Progress Notes (Signed)
[place] @ [facility] [phone]  Fax:([phone] [place]  Solimar Maiden OB: 08-01-1970  MR#: [id number]  DQQ#:[id number]  Patient Care Team: Lavonne Chick, MD as PCP - General (Family Medicine) Rico Junker, RN as Registered Nurse (Radiology) Theodore Demark, RN as Registered Nurse Seeplaputhur Robinette Haines, MD (General Surgery)   VISIT DIAGNOSIS:   Carcinoma of right breast Oncology History   1.  Abnormal mammogram with calcification in the right breast.  Biopsy on October 10, 2014 was positive for invasive carcinoma 2.  Status post lumpectomy and axillary lymph node evaluation T1B N0 M0 tumorESTROGEN RECEPTOR POSITIVE.  pROGESTERONE RECEPTOR POSITIVE.  Her-2/NEU RECEPTOR NEGATIVE BY FISH 3.  My risk genetic study (July of 2016) negative.  No clinically significant mutation identified   4.patient did not want to get radiation therapy and wanted to get mastectomy done.  Underwent right breast mastectomy  In August of 2016 No microscopy Evidence of malignancy was found     Oncology Flowsheet 11/01/2011 08/12/2014 10/23/2014 10/23/2014 12/05/2014  dexamethasone (DECADRON) IJ - - - - -  ondansetron (ZOFRAN) IV 4 mg - -   -  promethazine (PHENERGAN) IV 25 mg - 12.5 mg 12.5 mg -  promethazine (PHENERGAN) PO - 25 mg - - -    INTERVAL HISTORY:  44 year old lady presented to [facility] clinic with history of pelvic pain.  Routine mammogram because patient never had mammogram was ordered and was abnormal in the right breast.  Stereotactic biopsy was positive for invasive cancer patient was referred to me for discussion regarding various options of therapy Extremely apprehensive patient accompanied by friend. Patient's mother who is a Marine scientist lives in [place].  October 2 016 Patient is here for ongoing evaluation.  Patient was referred for radiation therapy did not like to have radiation treatment and underwent mastectomy.  No residual disease was found.  Patient is here  for ongoing evaluation and consideration of anti-hormonal therapy REVIEW OF SYSTEMS:  GENERAL:  Feels good.  Active.  No fevers, sweats or weight loss. PERFORMANCE STATUS (ECOG): O HEENT:  No visual changes, runny nose, sore throat, mouth sores or tenderness. Lungs: No shortness of breath or cough.  No hemoptysis. Cardiac:  No chest pain, palpitations, orthopnea, or PND. GI:  No nausea, vomiting, diarrhea, constipation, melena or hematochezia. GU:  No urgency, frequency, dysuria, or hematuria. Musculoskeletal:  No back pain.  No joint pain.  No muscle tenderness. Extremities:  No pain or swelling. Skin:  No rashes or skin changes. Neuro:  No headache, numbness or weakness, balance or coordination issues. In this September off 2014 patient had bleed in the brain and exact history not available. Diagnosis in [place], [place] Endocrine:  No diabetes, thyroid issues, hot flashes or night sweats. Psych: Patient has a mood disorder and on chronic medication being managed by a nurse practitioner Pain:  No focal pain. Review of systems:  All other systems reviewed and found to be negative. As per HPI. Otherwise, a complete review of systems is negatve.  PAST MEDICAL HISTORY: Past Medical History  Diagnosis Date  . Kidney stones   . Anemia   . Ovarian cyst   . Blood transfusion without reported diagnosis   . Hypertension   . Stroke [facility])     Sept. 2014  . Cancer of right breast ([place]) 10/16/2014    Upper and inner quadrant right breast, Invasive ductal carcinoma, Atypical Ductal Hyperplasia, ER/PR Pos, Her 2 Neg  . Anxiety   .  Headache   . MRSA infection 2009  . Dysrhythmia     TACHYCARDIA  . Enlarged heart   . BRCA negative 10-16-14  . PONV (postoperative nausea and vomiting)     DRY HEAVING AFTER BREAST LUMPECTOMY 10-2014    PAST SURGICAL HISTORY: Past Surgical History  Procedure Laterality Date  . Cholecystectomy    . Tonsillectomy    . Hernia repair    . Breast  biopsy Right 10/10/2014  . Tubal ligation    . Lithotripsy    . Breast lumpectomy with sentinel lymph node biopsy Right 10/23/2014    Procedure: Right breast lumpectomy with sentinel node biopsy ;  Surgeon: Christene Lye, MD;  Location: [facility] ORS;  Service: General;  Laterality: Right;  . Mastectomy modified radical Right 12/05/2014    Procedure: MASTECTOMY MODIFIED RADICAL;  Surgeon: Christene Lye, MD;  Location: [facility] ORS;  Service: General;  Laterality: Right;    FAMILY HISTORY Family History  Problem Relation Age of Onset  . Breast cancer Cousin 73   Mother had atypical cells in both breasts and has been removed before age 46 Streaky of lung cancer and stomach cancer in the family GYNECOLOGIC HISTORY: Heavy menstrual cycle every 14 days  ADVANCED DIRECTIVES:Patient does not have any living will or healthcare power of attorney.  Information was given .  Available resources had been discussed.  We will follow-up on subsequent appointments regarding this issue    HEALTH MAINTENANCE: Social History  Substance Use Topics  . Smoking status: Former Smoker    Types: Cigarettes  . Smokeless tobacco: None  . Alcohol Use: No     Comment: SOBER FOR 3 [id number](0626)      Allergies  Allergen Reactions  . Hydrocodone-Acetaminophen Hives  . Hydrocodone Itching  . Reglan [Metoclopramide] Other (See Comments)    "freaks me out, makes me nervous"  . Clindamycin/Lincomycin Rash  . Morphine And Related Rash    Current Outpatient Prescriptions  Medication Sig Dispense Refill  . acetaminophen (TYLENOL) 500 MG tablet Take 1,500 mg by mouth every 6 (six) hours as needed. For pain    . ALPRAZolam (XANAX) 0.25 MG tablet Take 1 tablet (0.25 mg total) by mouth every 6 (six) hours as needed for anxiety. 30 tablet 0  . aspirin 81 MG tablet Take 81 mg by mouth daily.    . DimenhyDRINATE (DRAMAMINE PO) Take 1 tablet by mouth as needed.    Marland Kitchen FLUoxetine (PROZAC) 10 MG capsule Take 10 mg by  mouth daily.    . Iron TABS Take 1 tablet by mouth daily.    Marland Kitchen lamoTRIgine (LAMICTAL) 25 MG tablet Take 25 mg by mouth at bedtime.     Marland Kitchen lisinopril (PRINIVIL,ZESTRIL) 10 MG tablet Take 10 mg by mouth every evening.     Marland Kitchen oxyCODONE-acetaminophen (ROXICET) 5-325 MG per tablet Take 1 tablet by mouth every 4 (four) hours as needed. 30 tablet 0  . promethazine (PHENERGAN) 12.5 MG suppository Place 1 suppository (12.5 mg total) rectally every 6 (six) hours as needed for nausea or vomiting. 12 each 0  . temazepam (RESTORIL) 7.5 MG capsule Take 7.5 mg by mouth at bedtime as needed for sleep.    . traMADol (ULTRAM) 50 MG tablet Take 1 tablet (50 mg total) by mouth every 6 (six) hours as needed. 30 tablet 0  . vitamin C (ASCORBIC ACID) 500 MG tablet Take 500 mg by mouth daily.     No current facility-administered medications for this visit.  OBJECTIVE: PHYSICAL EXAM: GENERAL:  Well developed, well nourished, sitting comfortably in the exam room in no acute distress.  Moderately obese lady very apprehensive MENTAL STATUS:  Alert and oriented to person, place and time. HEAD Normocephalic, atraumatic, face symmetric, no Cushingoid features. EYES:  Pupils equal round and reactive to light and accomodation.  No conjunctivitis or scleral icterus. ENT:  Oropharynx clear without lesion.  Tongue normal. Mucous membranes moist.  RESPIRATORY:  Clear to auscultation without rales, wheezes or rhonchi. CARDIOVASCULAR:  Regular rate and rhythm without murmur, rub or gallop. BREAST:  Her right breast status post mastectomy.  Chest wall area no evidence of recurrent disease.  Left breast free of masses. ABDOMEN:  Soft, non-tender, with active bowel sounds, and no hepatosplenomegaly.  No masses. BACK:  No CVA tenderness.  No tenderness on percussion of the back or rib cage. SKIN:  No rashes, ulcers or lesions. EXTREMITIES: No edema, no skin discoloration or tenderness.  No palpable cords. LYMPH NODES: No palpable  cervical, supraclavicular, axillary or inguinal adenopathy  NEUROLOGICAL: Unremarkable. PSYCH:  Appropriate.  Filed Vitals:   01/07/15 1531  BP: 168/89  Pulse: 71  Temp: 98.3 F (36.8 C)     Body mass index is 34.72 kg/(m^2).    ECOG FS:0 - Asymptomatic  LAB RESULTS:  No visits with results within 2 Day(s) from this visit. Latest known visit with results is:  Admission on 12/05/2014, Discharged on 12/05/2014  Component Date Value Ref Range Status  . Preg Test, Ur 12/05/2014 NEGATIVE  NEGATIVE Final   Comment:        THE SENSITIVITY OF THIS METHODOLOGY IS >24 mIU/mL   . SURGICAL PATHOLOGY 12/05/2014    Final                   Value:Surgical Pathology CASE: [id number] PATIENT: Elizabeth Flynn Surgical Pathology Report     SPECIMEN SUBMITTED: A. Breast, right  CLINICAL HISTORY: None provided  PRE-OPERATIVE DIAGNOSIS: Cancer right breast  POST-OPERATIVE DIAGNOSIS: Cancer right breast     DIAGNOSIS: A. RIGHT BREAST; TOTAL MASTECTOMY: - BENIGN BREAST TISSUE WITH FIBROCYSTIC CHANGE. - HISTOLOGIC FINDINGS CONSISTENT WITH BIOPSY SITE CHANGES. - NEGATIVE FOR ATYPIA AND RESIDUAL MALIGNANCY. - ONE BENIGN INTRAPARENCHYMAL LYMPH NODE (0/1).   GROSS DESCRIPTION:  A. Labeled: right breast  Time in Fixative: collected 1135 and placed in formalin at 11:45 AM on 12/05/2014 Total formalin fixation 8 hours  Cold Ischemic Time: 10 minute  Type of mastectomy: total  Weight of Specimen: 1000 grams  Size of specimen: 23 x 18.5 x 3.5 cm  Orientation of specimen: suture marking lateral Deep-black Lateral-orange Remaining margin blue  Skin ellipse dimensions  description: 16.2 x 8.                         8 cm with a 4 x 0.2 cm well-healed scar  Nipple/ areola: nipple-1.1 x 1.0 cm; areola 4.4 x 3.1 cm  Axillary tail:   present, absent, submitted separately  Biopsy site(s): present, underlying scar from approximately 1 to 3:00  Discrete Mass(es):   absent, biopsy cavity identified  Number of Discrete Masses: 1 biopsy cavity  Location of mass(es):    biopsy cavity at 1 to 3:00 Distance between masses:      not applicable  Size of biopsy site: 3.8 x 3.1 x 2.7 cm  Description of mass/biopsy site: shaggy biopsy cavity with surrounding fat necrosis and hemorrhage  Distance of mass/biopsy site from surgical  margins: 2.5 cm from the deep, 4.6 cm from remaining anterior  Gross involvement of skin/fascia/muscle by tumor: not grossly involved  Description of remaining breast: yellow lobulated fibrofatty, with an intraparenchymal lymph node (0.9 x 0.6 x 0.3 cm)  Other remarkable features: none noted  Block Summary: 1-perpendicular deep 2-7-representative sections of                          biopsy cavity and surrounding tissue 8-representative scar with intraparenchymal lymph node 9-representative nipple and areola 10-representative upper outer quadrant 11-representative lower outer quadrant 12-representative upper inner quadrant 13-representative lower inner quadrant  Final Diagnosis performed by Delorse Lek, MD.  Electronically signed 12/07/2014 1:30:57PM    The electronic signature indicates that the named Attending Pathologist has evaluated the specimen  Technical component performed at [facility], [address], [place], [place] [address] Lab: [phone] Dir: Darrick Penna. Evette Doffing, MD  Professional component performed at [facility], [facility], [place], [place], [place] [address] Lab: [phone] Dir: Dellia Nims. Rubinas, MD       STUDIES: No results found.  ASSESSMENT:  Carcinoma of breast based on ultrasound and mammogram appears to be early stage tumor Invasive cancer Estrogen progesterone and HER-2 receptor negative by fish  Didnot want radiation therapy and underwent right breast mastectomy  PLAN:   Patient underwent right breast mastectomy   Prolonged discussion regarding  possibility of use using tamoxifen for prevention of second malignancy in either breast.  As well as reducing chances for the systemic recurrent disease Patient needs to get her evaluation by a gynecologist as patient has irregular menstrual cycle Appointment has been made for her to be evaluation with gynecologist Return appointment in 6 months or before  Patient expressed understanding and was in agreement with this plan. She also understands that She can call clinic at any time with any questions, concerns, or complaints.    No matching staging information was found for the patient.  Forest Gleason, MD   01/07/2015 3:41 PM

## 2015-01-07 NOTE — Progress Notes (Signed)
Patient does not have living will.  Former smoker.  Patient complains of discomfort at surgical site - right mastectomy.  States she still has a lot of fluid at that site.  J-peg drain stayed in for a month. Further states she is having burning, stinging pain in right forearm. Patient states her anxiety is "through the roof".  Asking if she can have something to help.

## 2015-01-08 ENCOUNTER — Ambulatory Visit: Payer: Medicaid Other

## 2015-01-09 ENCOUNTER — Encounter: Payer: Self-pay | Admitting: General Surgery

## 2015-01-09 ENCOUNTER — Ambulatory Visit (INDEPENDENT_AMBULATORY_CARE_PROVIDER_SITE_OTHER): Payer: Medicaid Other | Admitting: General Surgery

## 2015-01-09 VITALS — BP 130/74 | HR 74 | Resp 12 | Ht 66.0 in | Wt 216.0 lb

## 2015-01-09 DIAGNOSIS — C50211 Malignant neoplasm of upper-inner quadrant of right female breast: Secondary | ICD-10-CM

## 2015-01-09 NOTE — Patient Instructions (Signed)
Return in 1 week.

## 2015-01-09 NOTE — Progress Notes (Signed)
  This is a 44 year old female follow up for breast cancer. Patient had a right mastectomy done on 12/05/14.  Started on Tamoxifen yesterday. She has an area of fluctuant swelling extending all along the mastectomy site with incision site clean and without erythema. Incision site is healing well.  Today drained 160 cc of straw-colored clear fluid. Return in 1 week.

## 2015-01-11 ENCOUNTER — Encounter: Payer: Self-pay | Admitting: *Deleted

## 2015-01-11 ENCOUNTER — Other Ambulatory Visit: Payer: Self-pay

## 2015-01-11 ENCOUNTER — Emergency Department
Admission: EM | Admit: 2015-01-11 | Discharge: 2015-01-12 | Disposition: A | Discharge status: Home / Self Care | Payer: Medicaid Other | Attending: Student | Admitting: Student

## 2015-01-11 DIAGNOSIS — I10 Essential (primary) hypertension: Secondary | ICD-10-CM | POA: Diagnosis not present

## 2015-01-11 DIAGNOSIS — R197 Diarrhea, unspecified: Secondary | ICD-10-CM | POA: Insufficient documentation

## 2015-01-11 DIAGNOSIS — R509 Fever, unspecified: Secondary | ICD-10-CM | POA: Diagnosis not present

## 2015-01-11 DIAGNOSIS — R1032 Left lower quadrant pain: Secondary | ICD-10-CM | POA: Diagnosis present

## 2015-01-11 DIAGNOSIS — Z79899 Other long term (current) drug therapy: Secondary | ICD-10-CM | POA: Insufficient documentation

## 2015-01-11 DIAGNOSIS — R112 Nausea with vomiting, unspecified: Secondary | ICD-10-CM | POA: Insufficient documentation

## 2015-01-11 DIAGNOSIS — Z3202 Encounter for pregnancy test, result negative: Secondary | ICD-10-CM | POA: Diagnosis not present

## 2015-01-11 DIAGNOSIS — Z7982 Long term (current) use of aspirin: Secondary | ICD-10-CM | POA: Insufficient documentation

## 2015-01-11 DIAGNOSIS — R1030 Lower abdominal pain, unspecified: Secondary | ICD-10-CM

## 2015-01-11 DIAGNOSIS — Z87891 Personal history of nicotine dependence: Secondary | ICD-10-CM | POA: Diagnosis not present

## 2015-01-11 LAB — COMPREHENSIVE METABOLIC PANEL
ALT: 11 U/L — ABNORMAL LOW (ref 14–54)
ANION GAP: 9 (ref 5–15)
AST: 18 U/L (ref 15–41)
Albumin: 4.1 g/dL (ref 3.5–5.0)
Alkaline Phosphatase: 57 U/L (ref 38–126)
BUN: 16 mg/dL (ref 6–20)
CHLORIDE: 107 mmol/L (ref 101–111)
CO2: 21 mmol/L — AB (ref 22–32)
Calcium: 9 mg/dL (ref 8.9–10.3)
Creatinine, Ser: 0.65 mg/dL (ref 0.44–1.00)
GFR calc non Af Amer: >60 mL/min (ref >60)
Glucose, Bld: 98 mg/dL (ref 65–99)
Potassium: 3.6 mmol/L (ref 3.5–5.1)
SODIUM: 137 mmol/L (ref 135–145)
Total Bilirubin: 0.2 mg/dL — ABNORMAL LOW (ref 0.3–1.2)
Total Protein: 7 g/dL (ref 6.5–8.1)

## 2015-01-11 LAB — CBC
HCT: 28.3 % — ABNORMAL LOW (ref 35.0–47.0)
HEMOGLOBIN: 8.5 g/dL — AB (ref 12.0–16.0)
MCH: 20.6 pg — AB (ref 26.0–34.0)
MCHC: 30.2 g/dL — ABNORMAL LOW (ref 32.0–36.0)
MCV: 68.2 fL — AB (ref 80.0–100.0)
Platelets: 397 10*3/uL (ref 150–440)
RBC: 4.15 MIL/uL (ref 3.80–5.20)
RDW: 18.1 % — ABNORMAL HIGH (ref 11.5–14.5)
WBC: 9.6 10*3/uL (ref 3.6–11.0)

## 2015-01-11 LAB — PREGNANCY, URINE: Preg Test, Ur: NEGATIVE

## 2015-01-11 LAB — URINALYSIS COMPLETE WITH MICROSCOPIC (ARMC ONLY)
BILIRUBIN URINE: NEGATIVE
Bacteria, UA: NONE SEEN
Glucose, UA: NEGATIVE mg/dL
Ketones, ur: NEGATIVE mg/dL
Nitrite: NEGATIVE
PH: 5 (ref 5.0–8.0)
Protein, ur: NEGATIVE mg/dL
SPECIFIC GRAVITY, URINE: 1.027 (ref 1.005–1.030)

## 2015-01-11 LAB — LIPASE, BLOOD: LIPASE: 22 U/L (ref 22–51)

## 2015-01-11 MED ORDER — PROMETHAZINE HCL 25 MG PO TABS
12.5000 mg | ORAL_TABLET | Freq: Once | ORAL | Status: AC
  Administered 2015-01-11: 12.5 mg via ORAL
  Filled 2015-01-11: qty 1

## 2015-01-11 NOTE — ED Notes (Signed)
Pt reports abdominal pain, vomiting, diarrhea x 2 days. Hx of diverticulitis, states feels similar.

## 2015-01-11 NOTE — ED Provider Notes (Signed)
[facility] Emergency Department Provider Note  ____________________________________________  Time seen: Approximately 11:02 PM  I have reviewed the triage vital signs and the nursing notes.   HISTORY  Chief Complaint Abdominal Pain and Emesis    HPI Elizabeth Flynn is a 44 y.o. female with history of severe anemia requiring frequent transfusions, hypertension, history of CVA, kidney stones, history of breast cancer managed with local treatment/mastectomy and no chemotherapy who presents for evaluation of 3 days of gradual onset constant cramping abdominal pain, worse in the left lower quadrant associated with several episodes of nonbloody nonbilious emesis as well as nonbloody diarrhea. She is also had subjective fevers and chills. No pain or burning with urination. She is currently menstruating. Good deep breathing. She reports this feels somewhat similar to her prior diverticulitis flares. Current severity of symptoms is moderate. Modifying factors other than eating which seems to make her nausea worse.   Past Medical History  Diagnosis Date  . Kidney stones   . Anemia   . Ovarian cyst   . Blood transfusion without reported diagnosis   . Hypertension   . Stroke [facility])     Sept. 2014  . Cancer of right breast ([place]) 10/16/2014    Upper and inner quadrant right breast, Invasive ductal carcinoma, Atypical Ductal Hyperplasia, ER/PR Pos, Her 2 Neg  . Anxiety   . Headache   . MRSA infection 2009  . Dysrhythmia     TACHYCARDIA  . Enlarged heart   . BRCA negative 10-16-14  . PONV (postoperative nausea and vomiting)     DRY HEAVING AFTER BREAST LUMPECTOMY 10-2014    Patient Active Problem List   Diagnosis Date Noted  . Cancer of right breast ([place]) 10/16/2014  . Absolute anemia 12/07/2012  . Clinical depression 12/07/2012  . Cerebrovascular accident, old 12/07/2012  . Near syncope 12/07/2012  . BP (high blood pressure) 12/07/2012    Past Surgical  History  Procedure Laterality Date  . Cholecystectomy    . Tonsillectomy    . Hernia repair    . Breast biopsy Right 10/10/2014  . Tubal ligation    . Lithotripsy    . Breast lumpectomy with sentinel lymph node biopsy Right 10/23/2014    Procedure: Right breast lumpectomy with sentinel node biopsy ;  Surgeon: Christene Lye, MD;  Location: [facility] ORS;  Service: General;  Laterality: Right;  . Mastectomy modified radical Right 12/05/2014    Procedure: MASTECTOMY MODIFIED RADICAL;  Surgeon: Christene Lye, MD;  Location: [facility] ORS;  Service: General;  Laterality: Right;    Current Outpatient Rx  Name  Route  Sig  Dispense  Refill  . acetaminophen (TYLENOL) 500 MG tablet   Oral   Take 1,500 mg by mouth every 6 (six) hours as needed. For pain         . FLUoxetine (PROZAC) 10 MG capsule   Oral   Take 10 mg by mouth daily.         . Iron TABS   Oral   Take 1 tablet by mouth daily.         Marland Kitchen lamoTRIgine (LAMICTAL) 25 MG tablet   Oral   Take 25 mg by mouth at bedtime.          Marland Kitchen lisinopril (PRINIVIL,ZESTRIL) 10 MG tablet   Oral   Take 10 mg by mouth every evening.          . tamoxifen (NOLVADEX) 20 MG tablet   Oral   Take  1 tablet (20 mg total) by mouth daily.   30 tablet   6   . temazepam (RESTORIL) 7.5 MG capsule   Oral   Take 7.5 mg by mouth at bedtime as needed for sleep.         . vitamin C (ASCORBIC ACID) 500 MG tablet   Oral   Take 500 mg by mouth daily.         Marland Kitchen ALPRAZolam (XANAX) 0.25 MG tablet   Oral   Take 1 tablet (0.25 mg total) by mouth every 6 (six) hours as needed for anxiety.   30 tablet   0   . aspirin 81 MG tablet   Oral   Take 81 mg by mouth daily.         . DimenhyDRINATE (DRAMAMINE PO)   Oral   Take 1 tablet by mouth as needed.         Marland Kitchen oxyCODONE-acetaminophen (ROXICET) 5-325 MG per tablet   Oral   Take 1 tablet by mouth every 4 (four) hours as needed.   30 tablet   0   . promethazine (PHENERGAN) 12.5 MG  suppository   Rectal   Place 1 suppository (12.5 mg total) rectally every 6 (six) hours as needed for nausea or vomiting.   12 each   0   . traMADol (ULTRAM) 50 MG tablet   Oral   Take 1 tablet (50 mg total) by mouth every 6 (six) hours as needed.   30 tablet   0     Allergies Hydrocodone-acetaminophen; Hydrocodone; Reglan; Clindamycin/lincomycin; and Morphine and related  Family History  Problem Relation Age of Onset  . Breast cancer Cousin 32    Social History Social History  Substance Use Topics  . Smoking status: Former Smoker    Types: Cigarettes  . Smokeless tobacco: None  . Alcohol Use: No     Comment: SOBER FOR 3 [id number](9892)    Review of Systems Constitutional: + subjective  fever/chills Eyes: No visual changes. ENT: No sore throat. Cardiovascular: Denies chest pain. Respiratory: Denies shortness of breath. Gastrointestinal: + abdominal pain.  + nausea, + vomiting.  + diarrhea.  No constipation. Genitourinary: Negative for dysuria. Musculoskeletal: Negative for back pain. Skin: Negative for rash. Neurological: Negative for headaches, focal weakness or numbness.  10-point ROS otherwise negative.  ____________________________________________   PHYSICAL EXAM:  VITAL SIGNS: ED Triage Vitals  Enc Vitals Group     BP 01/11/15 1926 160/85 mmHg     Pulse Rate 01/11/15 1926 70     Resp 01/11/15 1926 18     Temp 01/11/15 1926 97.9 F (36.6 C)     Temp Source 01/11/15 2209 Oral     SpO2 01/11/15 1926 96 %     Weight 01/11/15 1926 210 lb (95.255 kg)     Height 01/11/15 1926 5' 7"  (1.702 m)     Head Cir --      Peak Flow --      Pain Score 01/11/15 1924 8     Pain Loc --      Pain Edu? --      Excl. in [place]? --     Constitutional: Alert and oriented. Well appearing and in no acute distress. Eyes: Conjunctivae are normal. PERRL. EOMI. Head: Atraumatic. Nose: No congestion/rhinnorhea. Mouth/Throat: Mucous membranes are moist.  Oropharynx  non-erythematous. Neck: No stridor. Cardiovascular: Normal rate, regular rhythm. Grossly normal heart sounds.  Good peripheral circulation. Respiratory: Normal respiratory effort.  No retractions. Lungs CTAB.  Gastrointestinal: Soft with mild tenderness to palpation in the suprapubic region and the left lower quadrant. No CVA tenderness. Genitourinary: deferred Musculoskeletal: No lower extremity tenderness nor edema.  No joint effusions. Neurologic:  Normal speech and language. No gross focal neurologic deficits are appreciated.  Skin:  Skin is warm, dry and intact. No rash noted. Psychiatric: Mood and affect are normal. Speech and behavior are normal.  ____________________________________________   LABS (all labs ordered are listed, but only abnormal results are displayed)  Labs Reviewed  COMPREHENSIVE METABOLIC PANEL - Abnormal; Notable for the following:    CO2 21 (*)    ALT 11 (*)    Total Bilirubin 0.2 (*)    All other components within normal limits  CBC - Abnormal; Notable for the following:    Hemoglobin 8.5 (*)    HCT 28.3 (*)    MCV 68.2 (*)    MCH 20.6 (*)    MCHC 30.2 (*)    RDW 18.1 (*)    All other components within normal limits  URINALYSIS COMPLETEWITH MICROSCOPIC ([facility] ONLY) - Abnormal; Notable for the following:    Color, Urine YELLOW (*)    APPearance HAZY (*)    Hgb urine dipstick 3+ (*)    Leukocytes, UA TRACE (*)    Squamous Epithelial / LPF 0-5 (*)    All other components within normal limits  LIPASE, BLOOD  PREGNANCY, URINE   ____________________________________________  EKG ED ECG REPORT I, Joanne Gavel, the attending physician, personally viewed and interpreted this ECG.   Date: 01/11/2015  EKG Time: 23:56  Rate: 62  Rhythm: normal EKG, normal sinus rhythm  Axis: normal  Intervals:none  ST&T Change: No acute ST segment elevation. Flattened T-wave in lead 3 but otherwise  unremarkable.  ____________________________________________  RADIOLOGY  KUB IMPRESSION: 1. Unremarkable bowel gas pattern; no free intra-abdominal air seen. 2. Mild left basilar atelectasis noted. ____________________________________________   PROCEDURES  Procedure(s) performed: None  Critical Care performed: No  ____________________________________________   INITIAL IMPRESSION / ASSESSMENT AND PLAN / ED COURSE  Pertinent labs & imaging results that were available during my care of the patient were reviewed by me and considered in my medical decision making (see chart for details).  Elizabeth Flynn is a 44 y.o. female with history of severe anemia requiring frequent transfusions, hypertension, history of CVA, kidney stones, history of breast cancer managed with local treatment/mastectomy and no chemotherapy who presents for evaluation of 3 days of gradual onset constant cramping abdominal pain, worse in the left lower quadrant associated with several episodes of nonbloody nonbilious emesis as well as nonbloody diarrhea. On exam, she is very well-appearing and in no acute distress. Vital signs stable, she is afebrile. She does have mild to moderate tenderness to palpation in the suprapubic region in the left lower quadrant. Labs reviewed. No globe and a 0.5 appears to be her baseline. Normal CMP. No leukocytosis. Urinalysis is contaminated with red blood cells and white blood cells. Urine pregnancy test is negative. Discussed with her that her symptoms may be related to early diverticulitis however given that she is afebrile, no leukocytosis, doubt abscess at this time. Upright KUB shows no perforation. Her symptoms may also be related to viral syndrome but given history of similar presentations for diverticulitis, we'll treat with ciprofloxacin and Flagyl. At this time, her pain is significantly improved, she is requesting discharge. Discussed meticulous return precautions and she is  comfortable with discharge plan. ____________________________________________   FINAL CLINICAL IMPRESSION(S) / ED  DIAGNOSES  Final diagnoses:  Lower abdominal pain  Nausea, vomiting and diarrhea      Joanne Gavel, MD 01/12/15 0127

## 2015-01-12 ENCOUNTER — Other Ambulatory Visit: Payer: Medicaid Other

## 2015-01-12 ENCOUNTER — Emergency Department: Payer: Medicaid Other

## 2015-01-12 MED ORDER — CIPROFLOXACIN HCL 500 MG PO TABS: 500.0000 mg | ORAL_TABLET | Freq: Two times a day (BID) | ORAL | Status: DC

## 2015-01-12 MED ORDER — METRONIDAZOLE 500 MG PO TABS: 500.0000 mg | ORAL_TABLET | Freq: Three times a day (TID) | ORAL | Status: AC

## 2015-01-12 MED ORDER — METRONIDAZOLE 500 MG PO TABS
500.0000 mg | ORAL_TABLET | Freq: Once | ORAL | Status: AC
  Administered 2015-01-12: 500 mg via ORAL
  Filled 2015-01-12: qty 1

## 2015-01-12 MED ORDER — OXYCODONE HCL 5 MG PO TABS
5.0000 mg | ORAL_TABLET | Freq: Once | ORAL | Status: AC
Start: 2015-01-12 — End: 2015-01-12
  Administered 2015-01-12: 5 mg via ORAL

## 2015-01-12 MED ORDER — OXYCODONE HCL 5 MG PO TABS
ORAL_TABLET | ORAL | Status: AC
  Administered 2015-01-12: 5 mg via ORAL
  Filled 2015-01-12: qty 1

## 2015-01-12 MED ORDER — PROMETHAZINE HCL 12.5 MG PO TABS: 50.0000 mg | ORAL_TABLET | Freq: Four times a day (QID) | ORAL | Status: DC | PRN

## 2015-01-12 MED ORDER — CIPROFLOXACIN HCL 500 MG PO TABS
500.0000 mg | ORAL_TABLET | Freq: Once | ORAL | Status: AC
  Administered 2015-01-12: 500 mg via ORAL
  Filled 2015-01-12: qty 1

## 2015-01-12 NOTE — Discharge Instructions (Signed)
Your symptoms may be related to a virus or may be due to early diverticulitis. Take all medications as prescribed and follow up with your doctor as soon as possible. Return immediately to the emergency department if you develop severe or worsening abdominal pain, fever greater than 100.4, vomiting so severe that you're not able to keep down fluids, blood in vomit or stool, inability have a bowel movement, or for any other concerns.

## 2015-01-17 ENCOUNTER — Encounter: Payer: Self-pay | Admitting: Obstetrics and Gynecology

## 2015-01-17 ENCOUNTER — Encounter: Payer: Self-pay | Admitting: General Surgery

## 2015-01-17 ENCOUNTER — Ambulatory Visit (INDEPENDENT_AMBULATORY_CARE_PROVIDER_SITE_OTHER): Payer: Medicaid Other | Admitting: General Surgery

## 2015-01-17 ENCOUNTER — Ambulatory Visit (INDEPENDENT_AMBULATORY_CARE_PROVIDER_SITE_OTHER): Payer: Medicaid Other | Admitting: Obstetrics and Gynecology

## 2015-01-17 VITALS — BP 124/68 | HR 65 | Resp 12 | Ht 67.0 in | Wt 212.0 lb

## 2015-01-17 VITALS — BP 168/95 | HR 71 | Ht 67.0 in | Wt 209.6 lb

## 2015-01-17 DIAGNOSIS — C50911 Malignant neoplasm of unspecified site of right female breast: Secondary | ICD-10-CM | POA: Diagnosis not present

## 2015-01-17 DIAGNOSIS — N882 Stricture and stenosis of cervix uteri: Secondary | ICD-10-CM | POA: Insufficient documentation

## 2015-01-17 DIAGNOSIS — N921 Excessive and frequent menstruation with irregular cycle: Secondary | ICD-10-CM | POA: Diagnosis not present

## 2015-01-17 DIAGNOSIS — C50211 Malignant neoplasm of upper-inner quadrant of right female breast: Secondary | ICD-10-CM

## 2015-01-17 DIAGNOSIS — N920 Excessive and frequent menstruation with regular cycle: Secondary | ICD-10-CM | POA: Insufficient documentation

## 2015-01-17 MED ORDER — NYSTATIN 100000 UNIT/GM EX OINT: 1.0000 "application " | TOPICAL_OINTMENT | Freq: Two times a day (BID) | CUTANEOUS | Status: DC

## 2015-01-17 MED ORDER — TRIAMCINOLONE ACETONIDE 0.1 % EX OINT: 1.0000 "application " | TOPICAL_OINTMENT | Freq: Two times a day (BID) | CUTANEOUS | Status: DC

## 2015-01-17 NOTE — Progress Notes (Signed)
Patient ID: Elizabeth Flynn, female   DOB: January 26, 1971, 44 y.o.   MRN: [id number] follow up for breast cancer. Patient had a right mastectomy done on 12/05/14. Patient feels some fluid and is still tender.   Fluctuance along line of incision Drain placement. Seroma cath placed and connected to bulb drain. Order for Ultram 50 mg PO every 6 hours as needed for pain #30 called into her pharmacy.    PCP: Eveline Keto

## 2015-01-17 NOTE — Addendum Note (Signed)
Addended by: Elouise Munroe on: 01/17/2015 05:02 PM   Modules accepted: Orders

## 2015-01-17 NOTE — Progress Notes (Signed)
Patient ID: Elizabeth Flynn, female   DOB: March 01, 1971, 44 y.o.   MRN: [id number] Refer from Cancer center  aub- cycles q 14 d- 7 days- 6 days heavy- changing q hour  pelvic pain - pressure comes and goes   PATIENT EVALUATION  Referring physician: Dr. Rhona Leavens Chief complaint: 1.  Menorrhagia with anemia. 2.  Breast cancer.  The patient is a 44 year old white divorced female, para 2042, with long history of menorrhagia treated with multiple transfusions in the past, now with new diagnosis of breast cancer, status post right mastectomy, currently on tamoxifen therapy, presents for evaluation of menorrhagia to anemia.  Gynecologic history: Menarche age 44. Cycles regular until age 48.  When they became extremely heavy and occurring twice a month. Cycles prior to age 44 were monthly, but very heavy. Patient reports having had 25 transfusions while living in [place].  Workup included colonoscopy which was negative.  Birth control pills did not control her menorrhagia. Most recent hemoglobin this week is reportedly at 8 g. Patient has never had a D&C for her menorrhagia.  There has been no recent endometrial sampling.  Past Medical History  Diagnosis Date  . Kidney stones   . Anemia   . Ovarian cyst   . Blood transfusion without reported diagnosis   . Hypertension   . Stroke [facility])     Sept. 2014  . Anxiety   . Headache   . MRSA infection 2009  . Dysrhythmia     TACHYCARDIA  . Enlarged heart   . BRCA negative 10-16-14  . PONV (postoperative nausea and vomiting)     DRY HEAVING AFTER BREAST LUMPECTOMY 10-2014  . Cancer of right breast ([place]) 10/16/2014    Upper and inner quadrant right breast, Invasive ductal carcinoma, Atypical Ductal Hyperplasia, ER/PR Pos, Her 2 Neg   Past Surgical History  Procedure Laterality Date  . Cholecystectomy    . Tonsillectomy    . Hernia repair    . Tubal ligation    . Lithotripsy    . Breast lumpectomy with sentinel lymph node biopsy Right  10/23/2014    Procedure: Right breast lumpectomy with sentinel node biopsy ;  Surgeon: Christene Lye, MD;  Location: [facility] ORS;  Service: General;  Laterality: Right;  . Mastectomy modified radical Right 12/05/2014    Procedure: MASTECTOMY MODIFIED RADICAL;  Surgeon: Christene Lye, MD;  Location: [facility] ORS;  Service: General;  Laterality: Right;  . Breast biopsy Right 10/10/2014   Review of Systems  Constitutional: Positive for malaise/fatigue.  HENT: Negative.   Respiratory: Negative.   Cardiovascular: Negative.   Gastrointestinal: Negative.   Genitourinary: Negative.   Musculoskeletal: Negative.   Skin: Negative.   Neurological: Negative.   Endo/Heme/Allergies: Negative.   Psychiatric/Behavioral: Negative.    OBJECTIVE: BP 168/95 mmHg  Pulse 71  Ht 5' 7"  (1.702 m)  Wt 209 lb 9.6 oz (95.074 kg)  BMI 32.82 kg/m2  LMP 01/07/2015 Patient is a pleasant, well-appearing white female in no acute distress.  She is alert and oriented. Back: No CVA tenderness. Abdomen: Soft, nontender, without organomegaly. Pelvic exam: External genitalia normal BUS-normal Vagina-normal estrogen effect; no lesions; clear mucus in the vault. Uterus is midplane to anteverted; top normal size; nontender Adnexa-nonpalpable and nontender. Rectal exam-deferred.  PROCEDURE: Endometrial biopsy-patient was counseled regarding the need for endometrial sampling.  Verbal consent was given.  Patient was placed in the dorsal lithotomy position.  Cervix is grasped with a single tooth tenaculum.  Pipelle biopsy instrument was attempted  to be passed through the endocervical canal, but met resistance.  Uterine sound was used in an attempt to dilate the endocervical canal-unsuccessful.  Procedure was terminated.  All instrumentation was removed from the vagina.  Minimal bleeding was encountered.  IMPRESSION: 1.  Long history of menorrhagia to anemia. 2.  Cervical stenosis; endometrial biopsy had to be  discontinued. 3.  Breast cancer.  PLAN: 1.  Endometrial biopsy-discontinued. 2.  Patient is to be scheduled for hysteroscopy/D&C. 3.  Pelvic ultrasound ordered. 4.  Patient is to return several days after ultrasound for further management planning.  Brayton Mars, MD

## 2015-01-18 ENCOUNTER — Encounter: Payer: Self-pay | Admitting: General Surgery

## 2015-01-24 ENCOUNTER — Encounter: Payer: Self-pay | Admitting: General Surgery

## 2015-01-24 ENCOUNTER — Other Ambulatory Visit: Payer: Medicaid Other

## 2015-01-24 ENCOUNTER — Ambulatory Visit (INDEPENDENT_AMBULATORY_CARE_PROVIDER_SITE_OTHER): Payer: Medicaid Other | Admitting: General Surgery

## 2015-01-24 ENCOUNTER — Ambulatory Visit: Payer: Medicaid Other

## 2015-01-24 VITALS — BP 166/86 | HR 90 | Resp 18 | Ht 67.0 in | Wt 219.0 lb

## 2015-01-24 DIAGNOSIS — C50211 Malignant neoplasm of upper-inner quadrant of right female breast: Secondary | ICD-10-CM

## 2015-01-24 DIAGNOSIS — N921 Excessive and frequent menstruation with irregular cycle: Secondary | ICD-10-CM

## 2015-01-24 NOTE — Patient Instructions (Addendum)
The patient is aware to call back for any questions or concerns. Call if drain is less than 30 ml a day for 3 days.

## 2015-01-24 NOTE — Progress Notes (Addendum)
Here today for her follow up for breast cancer. Patient had a right mastectomy done on 12/05/14. Seroma cath placed last week.She has her drain sheet and is still a little tender.   Mastectomy site well healed. Drainage is serous. Continue drain record sheet. About 30-40 ml per day Follow up 2 weeks.     PCP:  Estell Harpin

## 2015-01-28 ENCOUNTER — Telehealth: Payer: Self-pay

## 2015-01-28 NOTE — Telephone Encounter (Signed)
Patient called and states that yesterday evening her drain fell out. She reports some swelling above her incision site she is attributing to fluid retention. She denies any chills, fever, or increase in discomfort. She would like to know if she should come in or not.

## 2015-01-29 ENCOUNTER — Encounter: Payer: Self-pay | Admitting: General Surgery

## 2015-01-29 ENCOUNTER — Ambulatory Visit (INDEPENDENT_AMBULATORY_CARE_PROVIDER_SITE_OTHER): Payer: Medicaid Other | Admitting: General Surgery

## 2015-01-29 ENCOUNTER — Ambulatory Visit: Payer: Medicaid Other | Admitting: General Surgery

## 2015-01-29 ENCOUNTER — Ambulatory Visit (INDEPENDENT_AMBULATORY_CARE_PROVIDER_SITE_OTHER): Payer: Medicaid Other | Admitting: Obstetrics and Gynecology

## 2015-01-29 ENCOUNTER — Encounter: Payer: Self-pay | Admitting: Obstetrics and Gynecology

## 2015-01-29 VITALS — BP 155/82 | HR 96 | Ht 67.0 in | Wt 215.0 lb

## 2015-01-29 VITALS — BP 160/94 | HR 76 | Temp 100.0°F | Resp 12 | Ht 67.0 in | Wt 212.0 lb

## 2015-01-29 DIAGNOSIS — C50211 Malignant neoplasm of upper-inner quadrant of right female breast: Secondary | ICD-10-CM

## 2015-01-29 DIAGNOSIS — N921 Excessive and frequent menstruation with irregular cycle: Secondary | ICD-10-CM | POA: Diagnosis not present

## 2015-01-29 DIAGNOSIS — T8189XD Other complications of procedures, not elsewhere classified, subsequent encounter: Secondary | ICD-10-CM

## 2015-01-29 DIAGNOSIS — N882 Stricture and stenosis of cervix uteri: Secondary | ICD-10-CM

## 2015-01-29 DIAGNOSIS — R102 Pelvic and perineal pain: Secondary | ICD-10-CM | POA: Diagnosis not present

## 2015-01-29 MED ORDER — DOXYCYCLINE HYCLATE 100 MG PO TBEC: 100.0000 mg | DELAYED_RELEASE_TABLET | Freq: Two times a day (BID) | ORAL | Status: DC

## 2015-01-29 NOTE — Progress Notes (Signed)
This is a 44 year old female here today for incision swelling at incision site. Patient states that Sunday her drain fell out. she is attributing to fluid retention. She states she was running a fever last night. Temp today 100.  Drain(seromacath) was reinserted, 120 cc serous fluid collected. Fluid does not appear infected. Empiric Doxycycline till C/S from seroma fluid available.

## 2015-01-29 NOTE — Patient Instructions (Signed)
1.Hysteroscopy/D&C is scheduled because of thickened endometrium on ultrasound. 2.  Return for preop appointment prior to surgery.

## 2015-01-29 NOTE — Progress Notes (Signed)
Patient ID: Elizabeth Flynn, female   DOB: 21-Apr-1970, 44 y.o.   MRN: [id number] U/s results  Chief complaint: 1.  Menorrhagia. 2.  Follow-up on ultrasound. 3.  History of breast cancer, currently on tamoxifen.  Patient presents for conference to discuss further management planning after ultrasound.  Attempted endometrial biopsy.  Her last visit was discontinued due to cervical stenosis.  Pelvic ultrasound has demonstrated a thickened endometrial stripe measuring 10.4 mm. Due to the patient being on tamoxifen, and the abnormal pelvic ultrasound, she is encouraged to proceed with hysteroscopy/D&C.  This will be scheduled in the near future.  IMPRESSION: 1.  History of breast cancer, on tamoxifen. 2.  Abnormal pelvic ultrasound with thickened endometrium, 10.4 mm 3.  History of menorrhagia. 4.  Status post attempted endometrial biopsy, aborted due to cervical stenosis.  PLAN: 1.  Patient is to proceed with hysteroscopy/D&C within the next 4-6 weeks. 2.  Patient will return for preop appointment.  A total of 15 minutes were spent face-to-face with the patient during this encounter and over half of that time dealt with counseling and coordination of care.  Brayton Mars, MD  Note: This dictation was prepared with Dragon dictation along with smaller phrase technology. Any transcriptional errors that result from this process are unintentional.

## 2015-01-30 ENCOUNTER — Encounter: Payer: Self-pay | Admitting: Internal Medicine

## 2015-01-30 ENCOUNTER — Inpatient Hospital Stay
Admission: EM | Admit: 2015-01-30 | Discharge: 2015-02-02 | DRG: 603 | Disposition: A | Discharge status: Home / Self Care | Payer: Medicaid Other | Attending: Internal Medicine | Admitting: Internal Medicine

## 2015-01-30 DIAGNOSIS — T8140XA Infection following a procedure, unspecified, initial encounter: Secondary | ICD-10-CM

## 2015-01-30 DIAGNOSIS — Z79899 Other long term (current) drug therapy: Secondary | ICD-10-CM

## 2015-01-30 DIAGNOSIS — Z888 Allergy status to other drugs, medicaments and biological substances status: Secondary | ICD-10-CM

## 2015-01-30 DIAGNOSIS — E876 Hypokalemia: Secondary | ICD-10-CM | POA: Diagnosis present

## 2015-01-30 DIAGNOSIS — Z885 Allergy status to narcotic agent status: Secondary | ICD-10-CM

## 2015-01-30 DIAGNOSIS — C50911 Malignant neoplasm of unspecified site of right female breast: Secondary | ICD-10-CM | POA: Diagnosis present

## 2015-01-30 DIAGNOSIS — Z9889 Other specified postprocedural states: Secondary | ICD-10-CM

## 2015-01-30 DIAGNOSIS — L03313 Cellulitis of chest wall: Principal | ICD-10-CM | POA: Diagnosis present

## 2015-01-30 DIAGNOSIS — Z87891 Personal history of nicotine dependence: Secondary | ICD-10-CM

## 2015-01-30 DIAGNOSIS — R51 Headache: Secondary | ICD-10-CM | POA: Diagnosis present

## 2015-01-30 DIAGNOSIS — D649 Anemia, unspecified: Secondary | ICD-10-CM | POA: Diagnosis present

## 2015-01-30 DIAGNOSIS — Z9049 Acquired absence of other specified parts of digestive tract: Secondary | ICD-10-CM

## 2015-01-30 DIAGNOSIS — S20321A Blister (nonthermal) of right front wall of thorax, initial encounter: Secondary | ICD-10-CM

## 2015-01-30 DIAGNOSIS — Z853 Personal history of malignant neoplasm of breast: Secondary | ICD-10-CM

## 2015-01-30 DIAGNOSIS — Z9851 Tubal ligation status: Secondary | ICD-10-CM

## 2015-01-30 DIAGNOSIS — F329 Major depressive disorder, single episode, unspecified: Secondary | ICD-10-CM | POA: Diagnosis present

## 2015-01-30 DIAGNOSIS — Z833 Family history of diabetes mellitus: Secondary | ICD-10-CM

## 2015-01-30 DIAGNOSIS — Z87442 Personal history of urinary calculi: Secondary | ICD-10-CM

## 2015-01-30 DIAGNOSIS — Z8249 Family history of ischemic heart disease and other diseases of the circulatory system: Secondary | ICD-10-CM

## 2015-01-30 DIAGNOSIS — Z803 Family history of malignant neoplasm of breast: Secondary | ICD-10-CM

## 2015-01-30 DIAGNOSIS — Z8614 Personal history of Methicillin resistant Staphylococcus aureus infection: Secondary | ICD-10-CM

## 2015-01-30 DIAGNOSIS — Z9011 Acquired absence of right breast and nipple: Secondary | ICD-10-CM

## 2015-01-30 DIAGNOSIS — Z8673 Personal history of transient ischemic attack (TIA), and cerebral infarction without residual deficits: Secondary | ICD-10-CM

## 2015-01-30 DIAGNOSIS — B9562 Methicillin resistant Staphylococcus aureus infection as the cause of diseases classified elsewhere: Secondary | ICD-10-CM | POA: Diagnosis present

## 2015-01-30 DIAGNOSIS — L089 Local infection of the skin and subcutaneous tissue, unspecified: Secondary | ICD-10-CM | POA: Diagnosis present

## 2015-01-30 DIAGNOSIS — I10 Essential (primary) hypertension: Secondary | ICD-10-CM | POA: Diagnosis present

## 2015-01-30 DIAGNOSIS — F419 Anxiety disorder, unspecified: Secondary | ICD-10-CM | POA: Diagnosis present

## 2015-01-30 LAB — BASIC METABOLIC PANEL
Anion gap: 8 (ref 5–15)
BUN: 18 mg/dL (ref 6–20)
CALCIUM: 8.7 mg/dL — AB (ref 8.9–10.3)
CO2: 22 mmol/L (ref 22–32)
Chloride: 110 mmol/L (ref 101–111)
Creatinine, Ser: 0.81 mg/dL (ref 0.44–1.00)
GFR calc Af Amer: >60 mL/min (ref >60)
GLUCOSE: 118 mg/dL — AB (ref 65–99)
POTASSIUM: 3.4 mmol/L — AB (ref 3.5–5.1)
SODIUM: 140 mmol/L (ref 135–145)

## 2015-01-30 LAB — CBC WITH DIFFERENTIAL/PLATELET
Basophils Absolute: 0.1 10*3/uL (ref 0–0.1)
Basophils Relative: 0 %
EOS ABS: 0.1 10*3/uL (ref 0–0.7)
EOS PCT: 1 %
HCT: 26.5 % — ABNORMAL LOW (ref 35.0–47.0)
Hemoglobin: 8 g/dL — ABNORMAL LOW (ref 12.0–16.0)
LYMPHS ABS: 1.7 10*3/uL (ref 1.0–3.6)
Lymphocytes Relative: 11 %
MCH: 20.3 pg — AB (ref 26.0–34.0)
MCHC: 30.3 g/dL — AB (ref 32.0–36.0)
MCV: 66.9 fL — ABNORMAL LOW (ref 80.0–100.0)
MONO ABS: 0.9 10*3/uL (ref 0.2–0.9)
Monocytes Relative: 6 %
Neutro Abs: 11.8 10*3/uL — ABNORMAL HIGH (ref 1.4–6.5)
Neutrophils Relative %: 82 %
PLATELETS: 358 10*3/uL (ref 150–440)
RBC: 3.95 MIL/uL (ref 3.80–5.20)
RDW: 18 % — AB (ref 11.5–14.5)
WBC: 14.5 10*3/uL — AB (ref 3.6–11.0)

## 2015-01-30 LAB — LACTIC ACID, PLASMA: Lactic Acid, Venous: 0.9 mmol/L (ref 0.5–2.0)

## 2015-01-30 LAB — ACETAMINOPHEN LEVEL: ACETAMINOPHEN (TYLENOL), SERUM: 11 ug/mL (ref 10–30)

## 2015-01-30 LAB — PROTIME-INR
INR: 1.04
PROTHROMBIN TIME: 13.8 s (ref 11.4–15.0)

## 2015-01-30 MED ORDER — SODIUM CHLORIDE 0.9 % IV BOLUS (SEPSIS)
1000.0000 mL | Freq: Once | INTRAVENOUS | Status: AC
  Administered 2015-01-30: 1000 mL via INTRAVENOUS

## 2015-01-30 MED ORDER — VANCOMYCIN HCL 10 G IV SOLR
1500.0000 mg | INTRAVENOUS | Status: DC
  Administered 2015-01-30: 1500 mg via INTRAVENOUS
  Filled 2015-01-30: qty 1500

## 2015-01-30 NOTE — ED Provider Notes (Signed)
[facility] Emergency Department Provider Note  ____________________________________________  Time seen: Approximately 850 PM  I have reviewed the triage vital signs and the nursing notes.   HISTORY  Chief Complaint Post-op Problem    HPI Elizabeth Flynn is a 44 y.o. female with a history of a right-sided mastectomy that was done August 31 was presenting for redness and pus drainage from the surgical site. She says that she has had drains placed by Dr. Jamal Collin intermittently since the initial surgery because of the surgical pocket filling with fluid. She says that over the last 2 days she has had fever up to 102 maximum, which was earlier today.She says she has been taking 2 Tylenol and 4 ibuprofen every 2 hours and last took them one hour ago. She says that she does not take the medication she starts having shaking chills. She was initially prescribed doxycycline but has not been able to get the prescription because she says it was put on a 72 hour delay because of Medicare approval. She says that she has started to see pus from the drain placed yesterday by Dr. Jamal Collin.  She said that also over the last hour she has seen redness spreading from the right breast. She says that she is also having pain over that site.   Past Medical History  Diagnosis Date  . Kidney stones   . Anemia   . Ovarian cyst   . Blood transfusion without reported diagnosis   . Hypertension   . Stroke [facility])     Sept. 2014  . Anxiety   . Headache   . MRSA infection 2009  . Dysrhythmia     TACHYCARDIA  . Enlarged heart   . BRCA negative 10-16-14  . PONV (postoperative nausea and vomiting)     DRY HEAVING AFTER BREAST LUMPECTOMY 10-2014  . Cancer of right breast ([place]) 10/16/2014    Upper and inner quadrant right breast, Invasive ductal carcinoma, Atypical Ductal Hyperplasia, ER/PR Pos, Her 2 Neg    Patient Active Problem List   Diagnosis Date Noted  . Menorrhagia 01/17/2015  .  Cervical stenosis (uterine cervix) 01/17/2015  . Cancer of right breast ([place]) 10/16/2014  . Absolute anemia 12/07/2012  . Clinical depression 12/07/2012  . Cerebrovascular accident, old 12/07/2012  . Near syncope 12/07/2012  . BP (high blood pressure) 12/07/2012    Past Surgical History  Procedure Laterality Date  . Cholecystectomy    . Tonsillectomy    . Hernia repair    . Tubal ligation    . Lithotripsy    . Breast lumpectomy with sentinel lymph node biopsy Right 10/23/2014    Procedure: Right breast lumpectomy with sentinel node biopsy ;  Surgeon: Christene Lye, MD;  Location: [facility] ORS;  Service: General;  Laterality: Right;  . Mastectomy modified radical Right 12/05/2014    Procedure: MASTECTOMY MODIFIED RADICAL;  Surgeon: Christene Lye, MD;  Location: [facility] ORS;  Service: General;  Laterality: Right;  . Breast biopsy Right 10/10/2014    Current Outpatient Rx  Name  Route  Sig  Dispense  Refill  . acetaminophen (TYLENOL) 500 MG tablet   Oral   Take 1,500 mg by mouth every 6 (six) hours as needed. For pain         . doxycycline (DORYX) 100 MG EC tablet   Oral   Take 1 tablet (100 mg total) by mouth 2 (two) times daily.   14 tablet   0   . FLUoxetine (PROZAC)  10 MG capsule   Oral   Take 10 mg by mouth daily.         . Iron TABS   Oral   Take 1 tablet by mouth daily.         Marland Kitchen lamoTRIgine (LAMICTAL) 25 MG tablet   Oral   Take 25 mg by mouth at bedtime.          Marland Kitchen lisinopril (PRINIVIL,ZESTRIL) 10 MG tablet   Oral   Take 10 mg by mouth every evening.          . nystatin ointment (MYCOSTATIN)   Topical   Apply 1 application topically 2 (two) times daily.   30 g   0   . tamoxifen (NOLVADEX) 20 MG tablet   Oral   Take 1 tablet (20 mg total) by mouth daily.   30 tablet   6   . temazepam (RESTORIL) 7.5 MG capsule   Oral   Take 7.5 mg by mouth at bedtime as needed for sleep.         Marland Kitchen triamcinolone ointment (KENALOG) 0.1 %    Topical   Apply 1 application topically 2 (two) times daily.   30 g   0   . vitamin C (ASCORBIC ACID) 500 MG tablet   Oral   Take 500 mg by mouth daily.           Allergies Hydrocodone-acetaminophen; Hydrocodone; Reglan; Clindamycin/lincomycin; and Morphine and related  Family History  Problem Relation Age of Onset  . Breast cancer Cousin 59  . Heart disease Maternal Grandmother   . Diabetes Paternal Grandmother   . Heart disease Paternal Grandmother   . Ovarian cancer Neg Hx   . Colon cancer Neg Hx     Social History Social History  Substance Use Topics  . Smoking status: Former Smoker    Types: Cigarettes  . Smokeless tobacco: Not on file  . Alcohol Use: No     Comment: SOBER FOR 3 RCBUL([id number])    Review of Systems Constitutional: Positive for fever and chills Eyes: No visual changes. ENT: No sore throat. Cardiovascular: Denies chest pain. Respiratory: Denies shortness of breath. Gastrointestinal: No abdominal pain.  No nausea, no vomiting.  No diarrhea.  No constipation. Genitourinary: Negative for dysuria. Musculoskeletal: Negative for back pain. Skin: Erythema over the right mastectomy site Neurological: Negative for headaches, focal weakness or numbness.  10-point ROS otherwise negative.  ____________________________________________   PHYSICAL EXAM:  VITAL SIGNS: ED Triage Vitals  Enc Vitals Group     BP 01/30/15 2047 189/81 mmHg     Pulse Rate 01/30/15 2047 106     Resp 01/30/15 2047 20     Temp 01/30/15 2047 98.4 F (36.9 C)     Temp Source 01/30/15 2047 Oral     SpO2 01/30/15 2047 100 %     Weight 01/30/15 2047 215 lb (97.523 kg)     Height 01/30/15 2047 5' 7"  (1.702 m)     Head Cir --      Peak Flow --      Pain Score 01/30/15 2047 8     Pain Loc --      Pain Edu? --      Excl. in [place]? --     Constitutional: Alert and oriented. Well appearing and in no acute distress. Eyes: Conjunctivae are normal. PERRL. EOMI. Head:  Atraumatic. Nose: No congestion/rhinnorhea. Mouth/Throat: Mucous membranes are moist.  Oropharynx non-erythematous. Neck: No stridor.   Cardiovascular: Tachycardic, regular  rhythm. Grossly normal heart sounds.  Good peripheral circulation. Respiratory: Normal respiratory effort.  No retractions. Lungs CTAB. Gastrointestinal: Soft and nontender. No distention. No abdominal bruits. No CVA tenderness. Musculoskeletal: No lower extremity tenderness nor edema.  No joint effusions. Neurologic:  Normal speech and language. No gross focal neurologic deficits are appreciated. No gait instability. Skin:  Right mastectomy site with erythema extending over the surgical incision and extending down to the right upper quadrant as well as to the right flank. There is tenderness over the surgical site of the right mastectomy. It is also warm to touch. There is pus in the drain and the fluid is a hazy opaque yellow.  Psychiatric: Mood and affect are normal. Speech and behavior are normal.  ____________________________________________   LABS (all labs ordered are listed, but only abnormal results are displayed)  Labs Reviewed  CBC WITH DIFFERENTIAL/PLATELET - Abnormal; Notable for the following:    WBC 14.5 (*)    Hemoglobin 8.0 (*)    HCT 26.5 (*)    MCV 66.9 (*)    MCH 20.3 (*)    MCHC 30.3 (*)    RDW 18.0 (*)    Neutro Abs 11.8 (*)    All other components within normal limits  BASIC METABOLIC PANEL - Abnormal; Notable for the following:    Potassium 3.4 (*)    Glucose, Bld 118 (*)    Calcium 8.7 (*)    All other components within normal limits  CULTURE, BLOOD (ROUTINE X 2)  CULTURE, BLOOD (ROUTINE X 2)  BODY FLUID CULTURE  LACTIC ACID, PLASMA  ACETAMINOPHEN LEVEL  PROTIME-INR  LACTIC ACID, PLASMA   ____________________________________________  EKG   ____________________________________________  RADIOLOGY   ____________________________________________   PROCEDURES  Angiocath  insertion Performed by: Doran Stabler  Consent: Verbal consent obtained. Risks and benefits: risks, benefits and alternatives were discussed Time out: Immediately prior to procedure a "time out" was called to verify the correct patient, procedure, equipment, support staff and site/side marked as required.  Preparation: Patient was prepped and draped in the usual sterile fashion.  Vein Location: Left basilic vein  Ultrasound Guided  Gauge: Long 18-gauge  Normal blood return and flush without difficulty Patient tolerance: Patient tolerated the procedure well with no immediate complications.    ____________________________________________   INITIAL IMPRESSION / ASSESSMENT AND PLAN / ED COURSE  Pertinent labs & imaging results that were available during my care of the patient were reviewed by me and considered in my medical decision making (see chart for details).  ----------------------------------------- 852 PM on 01/30/2015 ----------------------------------------- Case was discussed with Dr. Jamal Collin who recommends admission to medicine will see the patient in the morning.  ----------------------------------------- 11:44 PM on 01/30/2015 -----------------------------------------  Patient with elevated white blood cell count. The patient is febrile secondary to the infection at her right mastectomy site. The patient will be admitted to the hospital. I ordered vancomycin. Signed out to Dr. Jannifer Franklin for admission.  ____________________________________________   FINAL CLINICAL IMPRESSION(S) / ED DIAGNOSES  Postop infection at right mastectomy site.    Orbie Pyo, MD 01/30/15 [phone]

## 2015-01-30 NOTE — ED Notes (Addendum)
Patient ambulatory to triage with steady gait, without difficulty or distress noted; pt reports possible infection; 8/31 right mastectomy with 3 subsequent drains by Jamal Collin; f/u yesterday with the 3rd drain placement; c/o redness/drainage/fever (100.5 at home); ibuprofen and tylenol taken hr PTA; was rx doxycycline but pharmacy would not fill because her insurance would not pay for it for 72hrs

## 2015-01-30 NOTE — ED Notes (Signed)
Pt presents with right masectomy  With jackson pratt drain; white pus draining from site.

## 2015-01-31 DIAGNOSIS — Z8249 Family history of ischemic heart disease and other diseases of the circulatory system: Secondary | ICD-10-CM | POA: Diagnosis not present

## 2015-01-31 DIAGNOSIS — F329 Major depressive disorder, single episode, unspecified: Secondary | ICD-10-CM | POA: Diagnosis present

## 2015-01-31 DIAGNOSIS — Z87891 Personal history of nicotine dependence: Secondary | ICD-10-CM | POA: Diagnosis not present

## 2015-01-31 DIAGNOSIS — D649 Anemia, unspecified: Secondary | ICD-10-CM | POA: Diagnosis present

## 2015-01-31 DIAGNOSIS — Z87442 Personal history of urinary calculi: Secondary | ICD-10-CM | POA: Diagnosis not present

## 2015-01-31 DIAGNOSIS — T814XXA Infection following a procedure, initial encounter: Secondary | ICD-10-CM | POA: Diagnosis not present

## 2015-01-31 DIAGNOSIS — I10 Essential (primary) hypertension: Secondary | ICD-10-CM | POA: Diagnosis present

## 2015-01-31 DIAGNOSIS — Z833 Family history of diabetes mellitus: Secondary | ICD-10-CM | POA: Diagnosis not present

## 2015-01-31 DIAGNOSIS — Z79899 Other long term (current) drug therapy: Secondary | ICD-10-CM | POA: Diagnosis not present

## 2015-01-31 DIAGNOSIS — Z9011 Acquired absence of right breast and nipple: Secondary | ICD-10-CM | POA: Diagnosis not present

## 2015-01-31 DIAGNOSIS — F419 Anxiety disorder, unspecified: Secondary | ICD-10-CM | POA: Diagnosis present

## 2015-01-31 DIAGNOSIS — Z8673 Personal history of transient ischemic attack (TIA), and cerebral infarction without residual deficits: Secondary | ICD-10-CM | POA: Diagnosis not present

## 2015-01-31 DIAGNOSIS — B9562 Methicillin resistant Staphylococcus aureus infection as the cause of diseases classified elsewhere: Secondary | ICD-10-CM | POA: Diagnosis present

## 2015-01-31 DIAGNOSIS — Z853 Personal history of malignant neoplasm of breast: Secondary | ICD-10-CM | POA: Diagnosis not present

## 2015-01-31 DIAGNOSIS — Z8614 Personal history of Methicillin resistant Staphylococcus aureus infection: Secondary | ICD-10-CM | POA: Diagnosis not present

## 2015-01-31 DIAGNOSIS — Z9049 Acquired absence of other specified parts of digestive tract: Secondary | ICD-10-CM | POA: Diagnosis not present

## 2015-01-31 DIAGNOSIS — E876 Hypokalemia: Secondary | ICD-10-CM | POA: Diagnosis present

## 2015-01-31 DIAGNOSIS — R51 Headache: Secondary | ICD-10-CM | POA: Diagnosis present

## 2015-01-31 DIAGNOSIS — Z9889 Other specified postprocedural states: Secondary | ICD-10-CM | POA: Diagnosis not present

## 2015-01-31 DIAGNOSIS — Z888 Allergy status to other drugs, medicaments and biological substances status: Secondary | ICD-10-CM | POA: Diagnosis not present

## 2015-01-31 DIAGNOSIS — Z9851 Tubal ligation status: Secondary | ICD-10-CM | POA: Diagnosis not present

## 2015-01-31 DIAGNOSIS — C50911 Malignant neoplasm of unspecified site of right female breast: Secondary | ICD-10-CM | POA: Diagnosis present

## 2015-01-31 DIAGNOSIS — Z885 Allergy status to narcotic agent status: Secondary | ICD-10-CM | POA: Diagnosis not present

## 2015-01-31 DIAGNOSIS — L03313 Cellulitis of chest wall: Secondary | ICD-10-CM | POA: Diagnosis present

## 2015-01-31 DIAGNOSIS — Z803 Family history of malignant neoplasm of breast: Secondary | ICD-10-CM | POA: Diagnosis not present

## 2015-01-31 LAB — BASIC METABOLIC PANEL
ANION GAP: 5 (ref 5–15)
BUN: 15 mg/dL (ref 6–20)
CO2: 21 mmol/L — AB (ref 22–32)
Calcium: 8.1 mg/dL — ABNORMAL LOW (ref 8.9–10.3)
Chloride: 114 mmol/L — ABNORMAL HIGH (ref 101–111)
Creatinine, Ser: 0.64 mg/dL (ref 0.44–1.00)
GFR calc Af Amer: >60 mL/min (ref >60)
GLUCOSE: 110 mg/dL — AB (ref 65–99)
POTASSIUM: 3.2 mmol/L — AB (ref 3.5–5.1)
Sodium: 140 mmol/L (ref 135–145)

## 2015-01-31 LAB — CBC
HEMATOCRIT: 23.4 % — AB (ref 35.0–47.0)
HEMOGLOBIN: 6.9 g/dL — AB (ref 12.0–16.0)
MCH: 19.9 pg — AB (ref 26.0–34.0)
MCHC: 29.6 g/dL — AB (ref 32.0–36.0)
MCV: 67.1 fL — ABNORMAL LOW (ref 80.0–100.0)
Platelets: 315 10*3/uL (ref 150–440)
RBC: 3.49 MIL/uL — AB (ref 3.80–5.20)
RDW: 18 % — ABNORMAL HIGH (ref 11.5–14.5)
WBC: 13.3 10*3/uL — ABNORMAL HIGH (ref 3.6–11.0)

## 2015-01-31 LAB — IRON AND TIBC
Iron: 11 ug/dL — ABNORMAL LOW (ref 28–170)
SATURATION RATIOS: 3 % — AB (ref 10.4–31.8)
TIBC: 402 ug/dL (ref 250–450)
UIBC: 391 ug/dL

## 2015-01-31 LAB — VITAMIN B12: VITAMIN B 12: 187 pg/mL (ref 180–914)

## 2015-01-31 LAB — FOLATE: FOLATE: 6.5 ng/mL (ref >5.9)

## 2015-01-31 LAB — FERRITIN: FERRITIN: 25 ng/mL (ref 11–307)

## 2015-01-31 LAB — RETICULOCYTES
RBC.: 3.52 MIL/uL — AB (ref 3.80–5.20)
RETIC CT PCT: 1.6 % (ref 0.4–3.1)
Retic Count, Absolute: 56.3 10*3/uL (ref 19.0–183.0)

## 2015-01-31 LAB — HEMOGLOBIN: Hemoglobin: 7.2 g/dL — ABNORMAL LOW (ref 12.0–16.0)

## 2015-01-31 LAB — PREPARE RBC (CROSSMATCH)

## 2015-01-31 LAB — ABO/RH: ABO/RH(D): O NEG

## 2015-01-31 MED ORDER — LAMOTRIGINE 25 MG PO TABS
25.0000 mg | ORAL_TABLET | Freq: Every day | ORAL | Status: DC
  Administered 2015-01-31 – 2015-02-02 (×3): 25 mg via ORAL
  Filled 2015-01-31 (×3): qty 1

## 2015-01-31 MED ORDER — ONDANSETRON HCL 4 MG/2ML IJ SOLN: 4.0000 mg | Freq: Four times a day (QID) | INTRAMUSCULAR | Status: DC | PRN

## 2015-01-31 MED ORDER — ENOXAPARIN SODIUM 40 MG/0.4ML ~~LOC~~ SOLN: 40.0000 mg | SUBCUTANEOUS | Status: DC

## 2015-01-31 MED ORDER — ACETAMINOPHEN 650 MG RE SUPP: 650.0000 mg | Freq: Four times a day (QID) | RECTAL | Status: DC | PRN

## 2015-01-31 MED ORDER — HYDROMORPHONE HCL 1 MG/ML IJ SOLN
1.0000 mg | INTRAMUSCULAR | Status: DC | PRN
  Administered 2015-01-31: 1 mg via INTRAVENOUS

## 2015-01-31 MED ORDER — HYDROMORPHONE HCL 1 MG/ML IJ SOLN
INTRAMUSCULAR | Status: AC
  Filled 2015-01-31: qty 1

## 2015-01-31 MED ORDER — POTASSIUM CHLORIDE CRYS ER 20 MEQ PO TBCR
20.0000 meq | EXTENDED_RELEASE_TABLET | Freq: Once | ORAL | Status: AC
  Administered 2015-01-31: 20 meq via ORAL
  Filled 2015-01-31: qty 1

## 2015-01-31 MED ORDER — LISINOPRIL 10 MG PO TABS
10.0000 mg | ORAL_TABLET | Freq: Every evening | ORAL | Status: DC
  Administered 2015-01-31: 10 mg via ORAL
  Filled 2015-01-31: qty 1

## 2015-01-31 MED ORDER — HYDRALAZINE HCL 20 MG/ML IJ SOLN
10.0000 mg | INTRAMUSCULAR | Status: DC | PRN
  Administered 2015-01-31: 10 mg via INTRAVENOUS
  Filled 2015-01-31 (×2): qty 1

## 2015-01-31 MED ORDER — PROMETHAZINE HCL 25 MG/ML IJ SOLN
12.5000 mg | Freq: Four times a day (QID) | INTRAMUSCULAR | Status: DC | PRN
  Administered 2015-01-31 – 2015-02-02 (×8): 12.5 mg via INTRAVENOUS
  Filled 2015-01-31 (×8): qty 1

## 2015-01-31 MED ORDER — NYSTATIN 100000 UNIT/GM EX OINT
1.0000 "application " | TOPICAL_OINTMENT | Freq: Two times a day (BID) | CUTANEOUS | Status: DC
  Administered 2015-01-31 – 2015-02-02 (×5): 1 via TOPICAL
  Filled 2015-01-31: qty 15

## 2015-01-31 MED ORDER — TRIAMCINOLONE ACETONIDE 0.1 % EX OINT
1.0000 "application " | TOPICAL_OINTMENT | Freq: Two times a day (BID) | CUTANEOUS | Status: DC
  Administered 2015-01-31 – 2015-02-02 (×5): 1 via TOPICAL
  Filled 2015-01-31: qty 15

## 2015-01-31 MED ORDER — ONDANSETRON HCL 4 MG PO TABS: 4.0000 mg | ORAL_TABLET | Freq: Four times a day (QID) | ORAL | Status: DC | PRN

## 2015-01-31 MED ORDER — LEVOFLOXACIN IN D5W 500 MG/100ML IV SOLN
500.0000 mg | INTRAVENOUS | Status: DC
  Administered 2015-01-31 – 2015-02-01 (×2): 500 mg via INTRAVENOUS
  Filled 2015-01-31 (×3): qty 100

## 2015-01-31 MED ORDER — DOCUSATE SODIUM 100 MG PO CAPS
100.0000 mg | ORAL_CAPSULE | Freq: Two times a day (BID) | ORAL | Status: DC
  Administered 2015-01-31 – 2015-02-02 (×5): 100 mg via ORAL
  Filled 2015-01-31 (×5): qty 1

## 2015-01-31 MED ORDER — SODIUM CHLORIDE 0.9 % IV SOLN
INTRAVENOUS | Status: DC
  Administered 2015-01-31 (×2): via INTRAVENOUS

## 2015-01-31 MED ORDER — ACETAMINOPHEN 325 MG PO TABS
650.0000 mg | ORAL_TABLET | Freq: Four times a day (QID) | ORAL | Status: DC | PRN
  Administered 2015-01-31: 650 mg via ORAL
  Filled 2015-01-31: qty 2

## 2015-01-31 MED ORDER — TAMOXIFEN CITRATE 10 MG PO TABS
20.0000 mg | ORAL_TABLET | Freq: Every day | ORAL | Status: DC
  Administered 2015-01-31 – 2015-02-02 (×3): 20 mg via ORAL
  Filled 2015-01-31 (×3): qty 2

## 2015-01-31 MED ORDER — ALUM & MAG HYDROXIDE-SIMETH 200-200-20 MG/5ML PO SUSP
30.0000 mL | Freq: Four times a day (QID) | ORAL | Status: DC | PRN
  Administered 2015-01-31: 30 mL via ORAL
  Filled 2015-01-31: qty 30

## 2015-01-31 MED ORDER — SODIUM CHLORIDE 0.9 % IV SOLN
Freq: Once | INTRAVENOUS | Status: AC
  Administered 2015-01-31: 14:00:00 via INTRAVENOUS

## 2015-01-31 MED ORDER — HYDROMORPHONE HCL 1 MG/ML IJ SOLN
0.5000 mg | INTRAMUSCULAR | Status: DC | PRN
  Administered 2015-01-31 – 2015-02-02 (×12): 0.5 mg via INTRAVENOUS
  Filled 2015-01-31 (×12): qty 1

## 2015-01-31 MED ORDER — INFLUENZA VAC SPLIT QUAD 0.5 ML IM SUSY
0.5000 mL | PREFILLED_SYRINGE | INTRAMUSCULAR | Status: DC
  Filled 2015-01-31: qty 0.5

## 2015-01-31 MED ORDER — FLUOXETINE HCL 10 MG PO CAPS
10.0000 mg | ORAL_CAPSULE | Freq: Every day | ORAL | Status: DC
  Administered 2015-01-31 – 2015-02-02 (×3): 10 mg via ORAL
  Filled 2015-01-31 (×3): qty 1

## 2015-01-31 MED ORDER — TEMAZEPAM 7.5 MG PO CAPS: 7.5000 mg | ORAL_CAPSULE | Freq: Every evening | ORAL | Status: DC | PRN

## 2015-01-31 MED ORDER — VITAMIN C 500 MG PO TABS
500.0000 mg | ORAL_TABLET | Freq: Every day | ORAL | Status: DC
  Administered 2015-01-31 – 2015-02-02 (×3): 500 mg via ORAL
  Filled 2015-01-31 (×3): qty 1

## 2015-01-31 MED ORDER — VANCOMYCIN HCL IN DEXTROSE 1-5 GM/200ML-% IV SOLN
1000.0000 mg | Freq: Three times a day (TID) | INTRAVENOUS | Status: DC
  Administered 2015-01-31 – 2015-02-02 (×6): 1000 mg via INTRAVENOUS
  Filled 2015-01-31 (×9): qty 200

## 2015-01-31 MED ORDER — FERROUS SULFATE 325 (65 FE) MG PO TABS
325.0000 mg | ORAL_TABLET | Freq: Every day | ORAL | Status: DC
  Administered 2015-01-31 – 2015-02-02 (×3): 325 mg via ORAL
  Filled 2015-01-31 (×3): qty 1

## 2015-01-31 NOTE — Progress Notes (Signed)
Dr. Jannifer Franklin notified of: request for phenergan, "...zofran doesn't work for me.Marland KitchenMarland KitchenI should have told him...."; request to reinstate Dilaudid for pain; and hypertension -BP 196/103 post nausea med. Barbaraann Faster, RN; 2:17 AM; 01/31/2015 ;

## 2015-01-31 NOTE — Progress Notes (Signed)
Patient ID: Elizabeth Flynn, female   DOB: 1971-01-16, 44 y.o.   MRN: [id number] Surgery follow up. Pt seen 2 days ago and seroma cath placed. Fluid was serous , c/s done but report is pending. She reported fever and chillslast pm, and presented to ER. There is now mild induration and reddish hue of skin over the mastectomy site. Drainage is turbid yellow now. Currently she is afebrile. VSS. Await c/s to adjust antibiotic.

## 2015-01-31 NOTE — Progress Notes (Signed)
[place] [facility] NAME: Elizabeth Flynn    MR#:  [id number]  DATE OF BIRTH:  [birth year]  SUBJECTIVE:  CHIEF COMPLAINT:   Chief Complaint  Patient presents with  . Post-op Problem   - with weakness, worsened discharge from the mastectomy drain - hasn't used the outpatient doxycycline yet - no fevers today, hb dropped  REVIEW OF SYSTEMS:  Review of Systems  Constitutional: Positive for malaise/fatigue. Negative for fever and chills.  HENT: Negative for ear discharge and ear pain.   Respiratory: Negative for cough, shortness of breath and wheezing.   Cardiovascular: Positive for chest pain. Negative for palpitations and leg swelling.  Gastrointestinal: Negative for nausea, vomiting, abdominal pain, diarrhea and constipation.  Genitourinary: Negative for dysuria, urgency and frequency.  Musculoskeletal: Negative for myalgias and neck pain.  Neurological: Positive for weakness. Negative for dizziness, sensory change, speech change, seizures and headaches.  Psychiatric/Behavioral: Negative for depression.    DRUG ALLERGIES:   Allergies  Allergen Reactions  . Hydrocodone-Acetaminophen Hives and Itching  . Reglan [Metoclopramide] Other (See Comments)    "freaks me out, makes me nervous"  . Clindamycin/Lincomycin Rash  . Morphine And Related Rash    VITALS:  Blood pressure 164/77, pulse 83, temperature 98.4 F (36.9 C), temperature source Oral, resp. rate 16, height 5\' 7"  (1.702 m), weight 98.113 kg (216 lb 4.8 oz), last menstrual period 01/07/2015, SpO2 100 %.  PHYSICAL EXAMINATION:  Physical Exam  GENERAL:  44 y.o.-year-old obese patient lying in the bed with no acute distress.  EYES: Pupils equal, round, reactive to light and accommodation. No scleral icterus. Extraocular muscles intact.  HEENT: Head atraumatic, normocephalic. Oropharynx and nasopharynx clear.  NECK:  Supple, no jugular venous distention. No thyroid  enlargement, no tenderness.  LUNGS: Normal breath sounds bilaterally, no wheezing, rales,rhonchi or crepitation. No use of accessory muscles of respiration.  Chest- s/p right mastectomy, incision looks clean, no drainage from incision, the drain has muddy serous drainage in it, much improved erythema and tenderness CARDIOVASCULAR: S1, S2 normal. No murmurs, rubs, or gallops.  ABDOMEN: Soft, nontender, nondistended. Bowel sounds present. No organomegaly or mass.  EXTREMITIES: No pedal edema, cyanosis, or clubbing.  NEUROLOGIC: Cranial nerves II through XII are intact. Muscle strength 5/5 in all extremities. Sensation intact. Gait not checked.  PSYCHIATRIC: The patient is alert and oriented x 3.  SKIN: No obvious rash, lesion, or ulcer.    LABORATORY PANEL:   CBC  Recent Labs Lab 01/31/15 0501 01/31/15 0933  WBC 13.3*  --   HGB 6.9* 7.2*  HCT 23.4*  --   PLT 315  --    ------------------------------------------------------------------------------------------------------------------  Chemistries   Recent Labs Lab 01/31/15 0501  NA 140  K 3.2*  CL 114*  CO2 21*  GLUCOSE 110*  BUN 15  CREATININE 0.64  CALCIUM 8.1*   ------------------------------------------------------------------------------------------------------------------  Cardiac Enzymes No results for input(s): TROPONINI in the last 168 hours. ------------------------------------------------------------------------------------------------------------------  RADIOLOGY:  No results found.  EKG:   Orders placed or performed during the hospital encounter of 01/11/15  . ED EKG  . ED EKG  . EKG    ASSESSMENT AND PLAN:   Primary 36-year-old female with past medical history significant for transfusion dependent anemia of unknown cause, anxiety, kidney stones, recent diagnosis of right breast cancer status post mastectomy done about 2 months ago presents to the hospital secondary to fevers chills and redness  around the mastectomy site.  #1. Cellulitis of  the chest wall-improving now. - seroma diagnosed with the catheter in as outpatient. Cultures are pending. -Blood cultures also drawn. Mastectomy incision itself looks very clean and healed -Continue vancomycin and Levaquin -Adjust antibiotics once cultures are available to oral antibiotics and possible discharge -Appreciate surgical consult  2. Hypokalemia-20 mEq given this morning, give another 20 mEq. Patient also on lisinopril to monitor  3. Anemia-acute on chronic, drop in hemoglobin noted from 8-7, patient complaining of significant weakness and known history of transfusion dependent anemia greater than 6 years ago. And also had recent surgery. -We'll transfuse 1 unit, iron studies ordered and recommended outpatient follow-up with hematology for IV iron infusions if Iron is low. -Also has outpatient gynecology appointment for [facility] for thickened uterus  4. Chronic headaches, depression and anxiety-continue home medication  5. Breast cancer-diagnosed in June 2016, early stages on an incidental mammogram -Had right breast mastectomy done. -Continue tamoxifen  6. DVT prophylaxis-TEDs and SCDs - due to anemia, will hold off on anticoagulation   All the records are reviewed and case discussed with Care Management/Social Workerr. Management plans discussed with the patient, family and they are in agreement.  CODE STATUS: Full code  TOTAL TIME TAKING CARE OF THIS PATIENT: 37 minutes.   POSSIBLE D/C IN 1-2  DAYS, DEPENDING ON CLINICAL CONDITION.   Gladstone Lighter M.D on 01/31/2015 at 1:08 PM  Between 7am to 6pm - Pager - [phone]  After 6pm go to [email or web address] - password [email or web address]  Office  [phone]  CC: Primary care physician; Lavonne Chick, MD

## 2015-01-31 NOTE — Progress Notes (Signed)
ANTIBIOTIC CONSULT NOTE - INITIAL  Pharmacy Consult for Vancomycin/Levaquin Indication: Cellulitis/wound infection  Allergies  Allergen Reactions  . Hydrocodone-Acetaminophen Hives and Itching  . Reglan [Metoclopramide] Other (See Comments)    "freaks me out, makes me nervous"  . Clindamycin/Lincomycin Rash  . Morphine And Related Rash    Patient Measurements: Height: 5' 7"  (170.2 cm) Weight: 215 lb (97.523 kg) IBW/kg (Calculated) : 61.6 Adjusted Body Weight: 76 kg  Vital Signs: Temp: 98.3 F (36.8 C) (10/27 0136) Temp Source: Oral (10/27 0136) BP: 189/102 mmHg (10/27 0136) Pulse Rate: 89 (10/27 0136) Intake/Output from previous day:   Intake/Output from this shift:    Labs:  Recent Labs  01/30/15 2231  WBC 14.5*  HGB 8.0*  PLT 358  CREATININE 0.81   Estimated Creatinine Clearance: 104.1 mL/min (by C-G formula based on Cr of 0.81). No results for input(s): VANCOTROUGH, VANCOPEAK, VANCORANDOM, GENTTROUGH, GENTPEAK, GENTRANDOM, TOBRATROUGH, TOBRAPEAK, TOBRARND, AMIKACINPEAK, AMIKACINTROU, AMIKACIN in the last 72 hours.   Microbiology: No results found for this or any previous visit (from the past 720 hour(s)).  Medical History: Past Medical History  Diagnosis Date  . Kidney stones   . Anemia   . Ovarian cyst   . Blood transfusion without reported diagnosis   . Hypertension   . Stroke [facility])     Sept. 2014  . Anxiety   . Headache   . MRSA infection 2009  . Dysrhythmia     TACHYCARDIA  . Enlarged heart   . BRCA negative 10-16-14  . PONV (postoperative nausea and vomiting)     DRY HEAVING AFTER BREAST LUMPECTOMY 10-2014  . Cancer of right breast ([place]) 10/16/2014    Upper and inner quadrant right breast, Invasive ductal carcinoma, Atypical Ductal Hyperplasia, ER/PR Pos, Her 2 Neg    Medications:  Infusions:  . sodium chloride     Assessment: Elizabeth Flynn currently with third drain in place.  Fever 101 x 3 days PTA, was on amoxicillin for infection and fever/pain persists, white pus clogs drain. Starting IV vancomycin and levofloxacin.  Vd 53.2 L, Ke 0.091 hr-1, T1/2 7.6 hr  Goal of Therapy:  Vancomycin trough level 10-15 mcg/ml  Plan:  Expected duration 7 days with resolution of temperature and/or normalization of WBC. Levofloxacin 500 mg IV Q24H and vancomycin 1 gm IV Q8H received 1.5 gm in ED so no stacked dosing predicted trough 18 mcg/mL will continue to follow and adjust dose as needed to maintain trough 15 to 20 mcg/mL.  Laural Benes, Pharm.D. Clinical Pharmacist 01/31/2015,1:57 AM

## 2015-01-31 NOTE — H&P (Signed)
[facility] [place] NAME: Elizabeth Flynn    MR#:  [id number]  DATE OF BIRTH:  [birth year]  DATE OF ADMISSION:  01/30/2015  PRIMARY CARE PHYSICIAN: Lavonne Chick, MD   REQUESTING/REFERRING PHYSICIAN: Schaetiwtz MD  CHIEF COMPLAINT:   Chief Complaint  Patient presents with  . Post-op Problem    HISTORY OF PRESENT ILLNESS: Elizabeth Flynn  is a 44 y.o. female with a known history of right breast cancer, high blood pressure, anemia presented to the emergency room with fever and pain in the right chest wall. Patient underwent right mastectomy on 12/05/2014. Patient currently has a drain put into the right chest wall. This is the third drain that has been put by the surgeon after she had a mastectomy. For the last 3 days patient has fever of 10 69F and also has pain in the right chest wall. The pain is aching in nature 6 out of 10 scale of 1-10. Patient was given prescription for oral doxycycline antibiotic by her physician. Her insurance did not approve the medication, patient currently took amoxicillin medication. With persistent fever and pain she presented to the emergency room. She has white pus coming out in the tube, the drain is located beneath the mastectomy incision.  Not much was getting collected in the drain it's clogged in the tube. No history of any chest wall trauma. Patient was given IV vancomycin and divided in the emergency room. Hospitalist service was consulted for further care of the patient.   PAST MEDICAL HISTORY:   Past Medical History  Diagnosis Date  . Kidney stones   . Anemia   . Ovarian cyst   . Blood transfusion without reported diagnosis   . Hypertension   . Stroke [facility])     Sept. 2014  . Anxiety   . Headache   . MRSA infection 2009  . Dysrhythmia     TACHYCARDIA  . Enlarged heart   . BRCA negative 10-16-14  . PONV (postoperative nausea and vomiting)     DRY HEAVING AFTER BREAST LUMPECTOMY  10-2014  . Cancer of right breast ([place]) 10/16/2014    Upper and inner quadrant right breast, Invasive ductal carcinoma, Atypical Ductal Hyperplasia, ER/PR Pos, Her 2 Neg    PAST SURGICAL HISTORY:  Past Surgical History  Procedure Laterality Date  . Cholecystectomy    . Tonsillectomy    . Hernia repair    . Tubal ligation    . Lithotripsy    . Breast lumpectomy with sentinel lymph node biopsy Right 10/23/2014    Procedure: Right breast lumpectomy with sentinel node biopsy ;  Surgeon: Christene Lye, MD;  Location: [facility] ORS;  Service: General;  Laterality: Right;  . Mastectomy modified radical Right 12/05/2014    Procedure: MASTECTOMY MODIFIED RADICAL;  Surgeon: Christene Lye, MD;  Location: [facility] ORS;  Service: General;  Laterality: Right;  . Breast biopsy Right 10/10/2014    SOCIAL HISTORY:  Social History  Substance Use Topics  . Smoking status: Former Smoker    Types: Cigarettes  . Smokeless tobacco: Not on file  . Alcohol Use: No     Comment: SOBER FOR 3 [id number])    FAMILY HISTORY:  Family History  Problem Relation Age of Onset  . Breast cancer Cousin 3  . Heart disease Maternal Grandmother   . Diabetes Paternal Grandmother   . Heart disease Paternal Grandmother   . Ovarian cancer Neg Hx   . Colon cancer  Neg Hx     DRUG ALLERGIES:  Allergies  Allergen Reactions  . Hydrocodone-Acetaminophen Hives and Itching  . Reglan [Metoclopramide] Other (See Comments)    "freaks me out, makes me nervous"  . Clindamycin/Lincomycin Rash  . Morphine And Related Rash    REVIEW OF SYSTEMS:   CONSTITUTIONAL: fever present,no fatigue or weakness.  EYES: No blurred or double vision.  EARS, NOSE, AND THROAT: No tinnitus or ear pain.  RESPIRATORY: No cough, shortness of breath, wheezing or hemoptysis.  CARDIOVASCULAR: chest wall pain present, no orthopnea or edema.  GASTROINTESTINAL: No nausea, vomiting, diarrhea or abdominal pain.  GENITOURINARY: No dysuria,  hematuria.  ENDOCRINE: No polyuria, nocturia,  HEMATOLOGY: No anemia, easy bruising or bleeding SKIN: red inflammation noted over the right chest wall below the mastectomy incision. MUSCULOSKELETAL: No joint pain or arthritis.   NEUROLOGIC: No tingling, numbness, weakness.  PSYCHIATRY: No anxiety or depression.   MEDICATIONS AT HOME:  Prior to Admission medications   Medication Sig Start Date End Date Taking? Authorizing Provider  acetaminophen (TYLENOL) 500 MG tablet Take 1,500 mg by mouth every 6 (six) hours as needed. For pain    Historical Provider, MD  doxycycline (DORYX) 100 MG EC tablet Take 1 tablet (100 mg total) by mouth 2 (two) times daily. 01/29/15   Seeplaputhur Robinette Haines, MD  FLUoxetine (PROZAC) 10 MG capsule Take 10 mg by mouth daily.    Historical Provider, MD  Iron TABS Take 1 tablet by mouth daily.    Historical Provider, MD  lamoTRIgine (LAMICTAL) 25 MG tablet Take 25 mg by mouth at bedtime.     Historical Provider, MD  lisinopril (PRINIVIL,ZESTRIL) 10 MG tablet Take 10 mg by mouth every evening.  12/08/12   Historical Provider, MD  tamoxifen (NOLVADEX) 20 MG tablet Take 1 tablet (20 mg total) by mouth daily. 01/07/15   Forest Gleason, MD  temazepam (RESTORIL) 7.5 MG capsule Take 7.5 mg by mouth at bedtime as needed for sleep.    Historical Provider, MD  vitamin C (ASCORBIC ACID) 500 MG tablet Take 500 mg by mouth daily.    Historical Provider, MD      PHYSICAL EXAMINATION:   VITAL SIGNS: Blood pressure 189/81, pulse 106, temperature 98.4 F (36.9 C), temperature source Oral, resp. rate 20, height 5' 7"  (1.702 m), weight 97.523 kg (215 lb), last menstrual period 01/07/2015, SpO2 100 %.  GENERAL:  44 y.o.-year-old patient lying in the bed with no acute distress.  EYES: Pupils equal, round, reactive to light and accommodation. No scleral icterus. Extraocular muscles intact.  HEENT: Head atraumatic, normocephalic. Oropharynx and nasopharynx clear.  NECK:  Supple, no jugular  venous distention. No thyroid enlargement, no tenderness.  LUNGS: Normal breath sounds bilaterally, no wheezing, rales,rhonchi or crepitation. No use of accessory muscles of respiration.  CARDIOVASCULAR: S1, S2 normal. No murmurs, rubs, or gallops.  ABDOMEN: Soft, nontender, nondistended. Bowel sounds present. No organomegaly or mass.  EXTREMITIES: No pedal edema, cyanosis, or clubbing.  NEUROLOGIC: Cranial nerves II through XII are intact. Muscle strength 5/5 in all extremities. Sensation intact. Gait not checked.  PSYCHIATRIC: The patient is alert and oriented x 3.  SKIN: No obvious rash, lesion, or ulcer.   LABORATORY PANEL:   CBC  Recent Labs Lab 01/30/15 2231  WBC 14.5*  HGB 8.0*  HCT 26.5*  PLT 358  MCV 66.9*  MCH 20.3*  MCHC 30.3*  RDW 18.0*  LYMPHSABS 1.7  MONOABS 0.9  EOSABS 0.1  BASOSABS 0.1   ------------------------------------------------------------------------------------------------------------------  Chemistries   Recent Labs Lab 01/30/15 2231  NA 140  K 3.4*  CL 110  CO2 22  GLUCOSE 118*  BUN 18  CREATININE 0.81  CALCIUM 8.7*   ------------------------------------------------------------------------------------------------------------------ estimated creatinine clearance is 104.1 mL/min (by C-G formula based on Cr of 0.81). ------------------------------------------------------------------------------------------------------------------ No results for input(s): TSH, T4TOTAL, T3FREE, THYROIDAB in the last 72 hours.  Invalid input(s): FREET3   Coagulation profile  Recent Labs Lab 01/30/15 2231  INR 1.04   ------------------------------------------------------------------------------------------------------------------- No results for input(s): DDIMER in the last 72 hours. -------------------------------------------------------------------------------------------------------------------  Cardiac Enzymes No results for input(s): CKMB,  TROPONINI, MYOGLOBIN in the last 168 hours.  Invalid input(s): CK ------------------------------------------------------------------------------------------------------------------ Invalid input(s): POCBNP  ---------------------------------------------------------------------------------------------------------------  Urinalysis    Component Value Date/Time   COLORURINE YELLOW* 01/11/2015 1928   COLORURINE Yellow 08/01/2014 1017   APPEARANCEUR HAZY* 01/11/2015 1928   APPEARANCEUR Cloudy 08/01/2014 1017   LABSPEC 1.027 01/11/2015 1928   LABSPEC 1.026 08/01/2014 1017   PHURINE 5.0 01/11/2015 1928   PHURINE 6.0 08/01/2014 1017   GLUCOSEU NEGATIVE 01/11/2015 1928   GLUCOSEU Negative 08/01/2014 1017   HGBUR 3+* 01/11/2015 1928   HGBUR Negative 08/01/2014 1017   BILIRUBINUR NEGATIVE 01/11/2015 1928   BILIRUBINUR Negative 08/01/2014 1017   KETONESUR NEGATIVE 01/11/2015 1928   KETONESUR 1+ 08/01/2014 1017   PROTEINUR NEGATIVE 01/11/2015 1928   PROTEINUR 30 mg/dL 08/01/2014 1017   UROBILINOGEN 1.0 11/01/2011 1158   NITRITE NEGATIVE 01/11/2015 1928   NITRITE Negative 08/01/2014 1017   LEUKOCYTESUR TRACE* 01/11/2015 1928   LEUKOCYTESUR Negative 08/01/2014 1017     RADIOLOGY: No results found.  EKG: Orders placed or performed during the hospital encounter of 01/11/15  . ED EKG  . ED EKG  . EKG    IMPRESSION AND PLAN: 1. Right Chest wall cellullitis 2.Right breast cancer s/p mastectomy 3.Leucocytosis 4.Hypokalemia 5.Chronic anemia  Plan of management : 1. Start Pt on IV vancomycin and IV levaquin anitibiotics 2. Surgery consult for assessment of drain 3.Replace potassium 4.Pain mangment 5.IV fluids 5.DVT prophylaxis with [place] lovenox  All the records are reviewed and case discussed with ED provider. Management plans discussed with the patient, family and they are in agreement.  CODE STATUS:Full code    TOTAL TIME TAKING CARE OF THIS PATIENT: 45 minutes.     Saundra Shelling M.D on 01/31/2015 at 12:24 AM  Between 7am to 6pm - Pager - [phone]  After 6pm go to [email or web address] - password [email or web address] [facility]  Office  [phone]  CC: Primary care physician; Lavonne Chick, MD

## 2015-01-31 NOTE — Progress Notes (Signed)
ANTIBIOTIC CONSULT NOTE - INITIAL  Pharmacy Consult for Vancomycin/Levaquin Indication: Cellulitis/wound infection  Allergies  Allergen Reactions  . Hydrocodone-Acetaminophen Hives and Itching  . Reglan [Metoclopramide] Other (See Comments)    "freaks me out, makes me nervous"  . Clindamycin/Lincomycin Rash  . Morphine And Related Rash    Patient Measurements: Height: 5' 7"  (170.2 cm) Weight: 216 lb 4.8 oz (98.113 kg) IBW/kg (Calculated) : 61.6 Adjusted Body Weight: 76 kg  Vital Signs: Temp: 99.5 F (37.5 C) (10/27 1718) Temp Source: Oral (10/27 1718) BP: 197/92 mmHg (10/27 1718) Pulse Rate: 92 (10/27 1718) Intake/Output from previous day: 10/26 0701 - 10/27 0700 In: 930 [P.O.:240; I.V.:65; IV Piggyback:625] Out: 688 [Urine:675; Drains:13] Intake/Output from this shift: Total I/O In: 0  Out: 1000 [Urine:950; Drains:50]  Labs:  Recent Labs  01/30/15 2231 01/31/15 0501 01/31/15 0933  WBC 14.5* 13.3*  --   HGB 8.0* 6.9* 7.2*  PLT 358 315  --   CREATININE 0.81 0.64  --    Estimated Creatinine Clearance: 105.7 mL/min (by C-G formula based on Cr of 0.64). No results for input(s): VANCOTROUGH, VANCOPEAK, VANCORANDOM, GENTTROUGH, GENTPEAK, GENTRANDOM, TOBRATROUGH, TOBRAPEAK, TOBRARND, AMIKACINPEAK, AMIKACINTROU, AMIKACIN in the last 72 hours.   Microbiology: Recent Results (from the past 720 hour(s))  Blood culture (routine x 2)     Status: None (Preliminary result)   Collection Time: 01/30/15 10:31 PM  Result Value Ref Range Status   Specimen Description BLOOD LEFT ANTECUBITAL  Final   Special Requests BOTTLES DRAWN AEROBIC AND ANAEROBIC 5ML  Final   Culture NO GROWTH < 24 HOURS  Final   Report Status PENDING  Incomplete  Blood culture (routine x 2)     Status: None (Preliminary result)   Collection Time: 01/30/15 10:57 PM  Result Value Ref Range Status   Specimen Description BLOOD  Final   Special Requests NONE  Final   Culture NO GROWTH < 24 HOURS  Final   Report Status PENDING  Incomplete  Body fluid culture     Status: None (Preliminary result)   Collection Time: 01/30/15 11:20 PM  Result Value Ref Range Status   Specimen Description DRAINAGE  Final   Special Requests NONE  Final   Gram Stain PENDING  Incomplete   Culture NO GROWTH < 12 HOURS  Final   Report Status PENDING  Incomplete    Medical History: Past Medical History  Diagnosis Date  . Kidney stones   . Anemia   . Ovarian cyst   . Blood transfusion without reported diagnosis   . Hypertension   . Stroke [facility])     Sept. 2014  . Anxiety   . Headache   . MRSA infection 2009  . Dysrhythmia     TACHYCARDIA  . Enlarged heart   . BRCA negative 10-16-14  . PONV (postoperative nausea and vomiting)     DRY HEAVING AFTER BREAST LUMPECTOMY 10-2014  . [place] 10/16/2014    Upper and inner quadrant right breast, Invasive ductal carcinoma, Atypical Ductal Hyperplasia, ER/PR Pos, Her 2 Neg    Medications:  Infusions:  . sodium chloride 75 mL/hr at 01/31/15 0200   Assessment: 23 yof cc post op problem/wound infection sp right mastectomy 12/05/2014 currently with third drain in place. Fever 101 x 3 days PTA, was on amoxicillin for infection and fever/pain persists, white pus clogs drain. Starting IV vancomycin and levofloxacin.  Vd 53.2 L, Ke 0.091 hr-1, T1/2 7.6 hr  Goal of Therapy:  Vancomycin  trough level 10-15 mcg/ml  Plan:  Expected duration 7 days with resolution of temperature and/or normalization of WBC. Levofloxacin 500 mg IV Q24H and vancomycin 1 gm IV Q8H received 1.5 gm in ED so no stacked dosing predicted trough 18 mcg/mL will continue to follow and adjust dose as needed to maintain trough 15 to 20 mcg/mL.  10/27:   RN requested to reschedule Vanc due to blood administration.  Vanc dosing retimed to start 10/27 @ 20:00. Will draw Vanc trough on 10/28 @ 11:30.   Aerion Bagdasarian D, Pharm.D. Clinical Pharmacist 01/31/2015,6:06 PM

## 2015-02-01 LAB — CBC
HEMATOCRIT: 28 % — AB (ref 35.0–47.0)
Hemoglobin: 8.4 g/dL — ABNORMAL LOW (ref 12.0–16.0)
MCH: 21 pg — ABNORMAL LOW (ref 26.0–34.0)
MCHC: 30.2 g/dL — AB (ref 32.0–36.0)
MCV: 69.7 fL — ABNORMAL LOW (ref 80.0–100.0)
PLATELETS: 310 10*3/uL (ref 150–440)
RBC: 4.01 MIL/uL (ref 3.80–5.20)
RDW: 19.5 % — AB (ref 11.5–14.5)
WBC: 10.5 10*3/uL (ref 3.6–11.0)

## 2015-02-01 LAB — BASIC METABOLIC PANEL
Anion gap: 4 — ABNORMAL LOW (ref 5–15)
BUN: 11 mg/dL (ref 6–20)
CALCIUM: 8.5 mg/dL — AB (ref 8.9–10.3)
CO2: 22 mmol/L (ref 22–32)
CREATININE: 0.58 mg/dL (ref 0.44–1.00)
Chloride: 112 mmol/L — ABNORMAL HIGH (ref 101–111)
Glucose, Bld: 101 mg/dL — ABNORMAL HIGH (ref 65–99)
Potassium: 3.8 mmol/L (ref 3.5–5.1)
SODIUM: 138 mmol/L (ref 135–145)

## 2015-02-01 LAB — TYPE AND SCREEN
ABO/RH(D): O NEG
Antibody Screen: NEGATIVE
UNIT DIVISION: 0

## 2015-02-01 LAB — VANCOMYCIN, TROUGH: Vancomycin Tr: 12 ug/mL (ref 10–20)

## 2015-02-01 MED ORDER — AMLODIPINE BESYLATE 5 MG PO TABS
5.0000 mg | ORAL_TABLET | Freq: Every day | ORAL | Status: DC
Start: 2015-02-01 — End: 2015-02-02
  Administered 2015-02-01 – 2015-02-02 (×2): 5 mg via ORAL
  Filled 2015-02-01 (×2): qty 1

## 2015-02-01 MED ORDER — LISINOPRIL 20 MG PO TABS
20.0000 mg | ORAL_TABLET | Freq: Every evening | ORAL | Status: DC
  Filled 2015-02-01: qty 1

## 2015-02-01 NOTE — Progress Notes (Signed)
Patient ID: Elizabeth Flynn, female   DOB: June 12, 1970, 44 y.o.   MRN: [id number] C/S done in my office 3 days ago also with no growth. Pt appears to be better . Mastectomy site shows no redness or induration today. Drainage is more serous today. If stable she can be discharged tomorrow on oral antibiotic. Pt has f/u appt in my office.

## 2015-02-01 NOTE — Progress Notes (Signed)
[facility] [place] NAME: Elizabeth Flynn    MR#:  [id number]  DATE OF BIRTH:  1970/09/01  SUBJECTIVE:  CHIEF COMPLAINT:   Chief Complaint  Patient presents with  . Post-op Problem   - hb improved with transfusion, feels stronger - significant pain at the catheter site on right chest all night long- better now, erythema improving -no further fevers  REVIEW OF SYSTEMS:  Review of Systems  Constitutional: Negative for fever, chills and malaise/fatigue.  HENT: Negative for ear discharge and ear pain.   Respiratory: Negative for cough, shortness of breath and wheezing.   Cardiovascular: Positive for chest pain. Negative for palpitations and leg swelling.  Gastrointestinal: Negative for nausea, vomiting, abdominal pain, diarrhea and constipation.  Genitourinary: Negative for dysuria, urgency and frequency.  Musculoskeletal: Negative for myalgias and neck pain.  Neurological: Negative for dizziness, sensory change, speech change, seizures, weakness and headaches.  Psychiatric/Behavioral: Negative for depression.    DRUG ALLERGIES:   Allergies  Allergen Reactions  . Hydrocodone-Acetaminophen Hives and Itching  . Reglan [Metoclopramide] Other (See Comments)    "freaks me out, makes me nervous"  . Clindamycin/Lincomycin Rash  . Morphine And Related Rash    VITALS:  Blood pressure 176/86, pulse 76, temperature 98.6 F (37 C), temperature source Oral, resp. rate 20, height 5\' 7"  (1.702 m), weight 98.113 kg (216 lb 4.8 oz), last menstrual period 01/07/2015, SpO2 98 %.  PHYSICAL EXAMINATION:  Physical Exam  GENERAL:  44 y.o.-year-old obese patient lying in the bed with no acute distress.  EYES: Pupils equal, round, reactive to light and accommodation. No scleral icterus. Extraocular muscles intact.  HEENT: Head atraumatic, normocephalic. Oropharynx and nasopharynx clear.  NECK:  Supple, no jugular venous distention. No  thyroid enlargement, no tenderness.  LUNGS: Normal breath sounds bilaterally, no wheezing, rales,rhonchi or crepitation. No use of accessory muscles of respiration.  Chest- s/p right mastectomy, incision looks clean, no drainage from incision, the drain has muddy serous drainage in it, much improved erythema and tenderness CARDIOVASCULAR: S1, S2 normal. No murmurs, rubs, or gallops.  ABDOMEN: Soft, nontender, nondistended. Bowel sounds present. No organomegaly or mass.  EXTREMITIES: No pedal edema, cyanosis, or clubbing.  NEUROLOGIC: Cranial nerves II through XII are intact. Muscle strength 5/5 in all extremities. Sensation intact. Gait not checked.  PSYCHIATRIC: The patient is alert and oriented x 3.  SKIN: No obvious rash, lesion, or ulcer.    LABORATORY PANEL:   CBC  Recent Labs Lab 02/01/15 0404  WBC 10.5  HGB 8.4*  HCT 28.0*  PLT 310   ------------------------------------------------------------------------------------------------------------------  Chemistries   Recent Labs Lab 02/01/15 0404  NA 138  K 3.8  CL 112*  CO2 22  GLUCOSE 101*  BUN 11  CREATININE 0.58  CALCIUM 8.5*   ------------------------------------------------------------------------------------------------------------------  Cardiac Enzymes No results for input(s): TROPONINI in the last 168 hours. ------------------------------------------------------------------------------------------------------------------  RADIOLOGY:  No results found.  EKG:   Orders placed or performed during the hospital encounter of 01/11/15  . ED EKG  . ED EKG  . EKG    ASSESSMENT AND PLAN:   Primary 86-year-old female with past medical history significant for transfusion dependent anemia of unknown cause, anxiety, kidney stones, recent diagnosis of right breast cancer status post mastectomy done about 2 months ago presents to the hospital secondary to fevers chills and redness around the mastectomy site.  #1.  Cellulitis of the chest wall-improving now. - seroma diagnosed with the catheter in  as outpatient. Cultures are pending. -Blood cultures and seroma fluid cultures negative so far. Mastectomy incision itself looks very clean and healed -Continue vancomycin and discontinue Levaquin- change to doxycycline at discharge -Appreciate surgical consult  2. Hypokalemia- improved after replacement. Patient also on lisinopril to monitor  3. Anemia-acute on chronic, with known history of transfusion dependent anemia greater than 6 years ago. And also had recent surgery. -received 1unit Tx yesterday and hb improved. - recommended outpatient follow-up with hematology for IV iron infusions if Iron is low. -Also has outpatient gynecology appointment for [facility] for thickened uterus - stable for now  4. Chronic headaches, depression and anxiety-continue home medication  5. Breast cancer-diagnosed in June 2016, early stages on an incidental mammogram -Had right breast mastectomy done. -Continue tamoxifen  6. DVT prophylaxis-TEDs and SCDs - due to anemia, will hold off on anticoagulation encourage ambulation   All the records are reviewed and case discussed with Care Management/Social Workerr. Management plans discussed with the patient, family and they are in agreement.  CODE STATUS: Full code  TOTAL TIME TAKING CARE OF THIS PATIENT: 37 minutes.   POSSIBLE D/C IN TOMORROW, DEPENDING ON CLINICAL CONDITION.   Chanta Bauers M.D on 02/01/2015 at 11:41 AM  Between 7am to 6pm - Pager - [phone]  After 6pm go to [email or web address] - password [email or web address] [facility]  Office  [phone]  CC: Primary care physician; Lavonne Chick, MD

## 2015-02-01 NOTE — Progress Notes (Signed)
ANTIBIOTIC CONSULT NOTE - INITIAL  Pharmacy Consult for Vancomycin/Levaquin Indication: Cellulitis/wound infection  Allergies  Allergen Reactions  . Hydrocodone-Acetaminophen Hives and Itching  . Reglan [Metoclopramide] Other (See Comments)    "freaks me out, makes me nervous"  . Clindamycin/Lincomycin Rash  . Morphine And Related Rash    Patient Measurements: Height: 5' 7"  (170.2 cm) Weight: 216 lb 4.8 oz (98.113 kg) IBW/kg (Calculated) : 61.6 Adjusted Body Weight: 76 kg  Vital Signs: Temp: 98.8 F (37.1 C) (10/28 1322) Temp Source: Oral (10/28 1322) BP: 173/84 mmHg (10/28 1322) Pulse Rate: 107 (10/28 1322) Intake/Output from previous day: 10/27 0701 - 10/28 0700 In: 1502 [P.O.:360; I.V.:816; Blood:326] Out: 1535 [Urine:1425; Drains:110] Intake/Output from this shift: Total I/O In: 343.8 [P.O.:120; I.V.:223.8] Out: 110 [Urine:100; Drains:10]  Labs:  Recent Labs  01/30/15 2231 01/31/15 0501 01/31/15 0933 02/01/15 0404  WBC 14.5* 13.3*  --  10.5  HGB 8.0* 6.9* 7.2* 8.4*  PLT 358 315  --  310  CREATININE 0.81 0.64  --  0.58   Estimated Creatinine Clearance: 105.7 mL/min (by C-G formula based on Cr of 0.58).  Recent Labs  02/01/15 1125  [place] 12     Microbiology: Recent Results (from the past 720 hour(s))  Anaerobic and Aerobic Culture     Status: None (Preliminary result)   Collection Time: 01/29/15 11:50 AM  Result Value Ref Range Status   Anaerobic Culture Preliminary report  Preliminary   Result 1 Comment  Preliminary    Comment: Specimen has been received and testing has been initiated.   Aerobic Culture WILL FOLLOW  Preliminary  Blood culture (routine x 2)     Status: None (Preliminary result)   Collection Time: 01/30/15 10:31 PM  Result Value Ref Range Status   Specimen Description BLOOD LEFT ANTECUBITAL  Final   Special Requests BOTTLES DRAWN AEROBIC AND ANAEROBIC 5ML  Final   Culture NO GROWTH < 24 HOURS  Final   Report Status PENDING   Incomplete  Blood culture (routine x 2)     Status: None (Preliminary result)   Collection Time: 01/30/15 10:57 PM  Result Value Ref Range Status   Specimen Description BLOOD  Final   Special Requests NONE  Final   Culture NO GROWTH < 24 HOURS  Final   Report Status PENDING  Incomplete  Body fluid culture     Status: None (Preliminary result)   Collection Time: 01/30/15 11:20 PM  Result Value Ref Range Status   Specimen Description DRAINAGE  Final   Special Requests NONE  Final   Gram Stain PENDING  Incomplete   Culture   Final    LIGHT GROWTH STAPHYLOCOCCUS AUREUS SUSCEPTIBILITIES TO FOLLOW    Report Status PENDING  Incomplete    Medical History: Past Medical History  Diagnosis Date  . Kidney stones   . Anemia   . Ovarian cyst   . Blood transfusion without reported diagnosis   . Hypertension   . Stroke [facility])     Sept. 2014  . Anxiety   . Headache   . MRSA infection 2009  . Dysrhythmia     TACHYCARDIA  . Enlarged heart   . BRCA negative 10-16-14  . PONV (postoperative nausea and vomiting)     DRY HEAVING AFTER BREAST LUMPECTOMY 10-2014  . Cancer of right breast ([place]) 10/16/2014    Upper and inner quadrant right breast, Invasive ductal carcinoma, Atypical Ductal Hyperplasia, ER/PR Pos, Her 2 Neg    Medications:  Infusions:  .  sodium chloride 75 mL/hr at 01/31/15 1952   Assessment: 49 yof cc post op problem/wound infection sp right mastectomy 12/05/2014 currently with third drain in place. Fever 101 x 3 days PTA, was on amoxicillin for infection and fever/pain persists, white pus clogs drain. Starting IV vancomycin and levofloxacin.  Vd 53.2 L, Ke 0.091 hr-1, T1/2 7.6 hr  Goal of Therapy:  Vancomycin trough level 10-15 mcg/ml  Plan:  Trough =12; pt being treated for cellulitis. continue current dose of 1g q 8 hour. Plan is to d/c pt tomorrow on oral doxy. Continue vanc for now. Pharmacy to continue to follow.  Ramond Dial, Pharm.D. Clinical  Pharmacist 02/01/2015,2:38 PM

## 2015-02-02 MED ORDER — AMLODIPINE BESYLATE 10 MG PO TABS: 10.0000 mg | ORAL_TABLET | Freq: Every day | ORAL | Status: AC

## 2015-02-02 MED ORDER — AMLODIPINE BESYLATE 5 MG PO TABS: 5.0000 mg | ORAL_TABLET | Freq: Every day | ORAL | Status: DC

## 2015-02-02 MED ORDER — DOXYCYCLINE HYCLATE 100 MG PO TBEC: 100.0000 mg | DELAYED_RELEASE_TABLET | Freq: Two times a day (BID) | ORAL | Status: AC

## 2015-02-02 MED ORDER — LISINOPRIL 20 MG PO TABS: 20.0000 mg | ORAL_TABLET | Freq: Every evening | ORAL | Status: AC

## 2015-02-02 MED ORDER — ACETAMINOPHEN 500 MG PO TABS: 1000.0000 mg | ORAL_TABLET | Freq: Four times a day (QID) | ORAL | Status: AC | PRN

## 2015-02-02 MED ORDER — OXYCODONE-ACETAMINOPHEN 10-325 MG PO TABS: 1.0000 | ORAL_TABLET | Freq: Four times a day (QID) | ORAL | Status: DC | PRN

## 2015-02-02 NOTE — Discharge Instructions (Signed)
Notify MD for increasing signs of infection such as fever, increasing drainage, drainage from around your JP site, foul odor to the drainage, etc.  Empty your JP drain and keep a log of the output to report to your doctor. Notify your doctor for increasing pain not relieved with the prescribed pain medications. Make sure you schedule your follow-up appointment on Monday (10/31). Take all medications as prescribed. Notify MD with any questions or concerns.

## 2015-02-02 NOTE — Progress Notes (Signed)
Discharge orders received.  Reviewed the discharge instructions with the patient who verbalized understanding.  Reviewed JP drain care with the patient who verbalized understanding.  Patient discharged home with significant other. No acute distress noted at the time of discharge.  A copy of the instructions were provided to the patient.

## 2015-02-02 NOTE — Progress Notes (Signed)
Received call from lab-microbiology reporting results of "light growth of MRSA". This was reported to Dr. Tressia Miners.  Patient placed on isolation. No new orders received. No acute distress noted at this time.

## 2015-02-02 NOTE — Discharge Summary (Signed)
[place] [facility] [place] NAME: Elizabeth Flynn    MR#:  [id number]  DATE OF BIRTH:  1970/12/14  DATE OF ADMISSION:  01/30/2015 ADMITTING PHYSICIAN: Saundra Shelling, MD  DATE OF DISCHARGE: 02/02/2015  PRIMARY CARE PHYSICIAN: Lavonne Chick, MD    ADMISSION DIAGNOSIS:  Post op infection [T81.4XXA]  DISCHARGE DIAGNOSIS:  Principal Problem:   Blister of right chest wall with infection Active Problems:   Cellulitis of chest wall   SECONDARY DIAGNOSIS:   Past Medical History  Diagnosis Date  . Kidney stones   . Anemia   . Ovarian cyst   . Blood transfusion without reported diagnosis   . Hypertension   . Stroke [facility])     Sept. 2014  . Anxiety   . Headache   . MRSA infection 2009  . Dysrhythmia     TACHYCARDIA  . Enlarged heart   . BRCA negative 10-16-14  . PONV (postoperative nausea and vomiting)     DRY HEAVING AFTER BREAST LUMPECTOMY 10-2014  . Cancer of right breast ([place]) 10/16/2014    Upper and inner quadrant right breast, Invasive ductal carcinoma, Atypical Ductal Hyperplasia, ER/PR Pos, Her 2 Neg    HOSPITAL COURSE:   44 year old female with past medical history significant for transfusion dependent anemia of unknown cause, anxiety, kidney stones, recent diagnosis of right breast cancer status post mastectomy done about 2 months ago presents to the hospital secondary to fevers chills and redness around the mastectomy site.  #1. Cellulitis of the chest wall-improving now. - seroma diagnosed with the catheter in as outpatient. Cultures are growing light growth of MRSA. -Blood cultures negative so far. Mastectomy incision itself looks very clean and healed -Was on vancomycin in the hospital, being discharged on doxycycline -Drain will be removed as an outpatient in the next week. The serous fluid in the drain is getting more clearer -Also pain of the chest wall at the site of catheter insertion, continue oxycodone as  outpatient -Appreciate surgical consult  2. Hypokalemia- improved after replacement. Patient also on lisinopril to monitor  3. Anemia-acute on chronic, with known history of transfusion dependent anemia greater than 6 years ago. And also had recent surgery. -received 1unit Tx this hospitalizarion and hb stable. - recommended outpatient follow-up with hematology for IV iron infusions if Iron is low. -Also has outpatient gynecology appointment for [facility] for thickened uterus - stable for now  4. Chronic headaches, depression and anxiety-continue home medications  5. Breast cancer-diagnosed in June 2016, early stages on an incidental mammogram -Had right breast mastectomy done. -Continue tamoxifen  6. HTN- elevated blood pressures noted in the hospital. Especially has had history of stroke in the past. -Her lisinopril dose is increased. Also Norvasc has been started. -Patient has some clonidine for breakthrough elevated blood pressure from her PCP. Advised to follow up with PCP within a week  Patient is being discharged home in a stable condition  DISCHARGE CONDITIONS:   Stable  CONSULTS OBTAINED:  Treatment Team:  Debbra Riding, MD  DRUG ALLERGIES:   Allergies  Allergen Reactions  . Hydrocodone-Acetaminophen Hives and Itching  . Reglan [Metoclopramide] Other (See Comments)    "freaks me out, makes me nervous"  . Clindamycin/Lincomycin Rash  . Morphine And Related Rash    DISCHARGE MEDICATIONS:   Current Discharge Medication List    START taking these medications   Details  amLODipine (NORVASC) 10 MG tablet Take 1 tablet (10 mg total) by mouth daily.  Qty: 30 tablet, Refills: 0    oxyCODONE-acetaminophen (PERCOCET) 10-325 MG tablet Take 1 tablet by mouth every 6 (six) hours as needed for pain. Qty: 20 tablet, Refills: 0      CONTINUE these medications which have CHANGED   Details  acetaminophen (TYLENOL) 500 MG tablet Take 2 tablets (1,000 mg total) by mouth every 6  (six) hours as needed. For pain Qty: 30 tablet, Refills: 0    doxycycline (DORYX) 100 MG EC tablet Take 1 tablet (100 mg total) by mouth 2 (two) times daily. Qty: 14 tablet, Refills: 0    lisinopril (PRINIVIL,ZESTRIL) 20 MG tablet Take 1 tablet (20 mg total) by mouth every evening. Qty: 30 tablet, Refills: 0      CONTINUE these medications which have NOT CHANGED   Details  FLUoxetine (PROZAC) 10 MG capsule Take 10 mg by mouth daily.    Iron TABS Take 1 tablet by mouth daily.    lamoTRIgine (LAMICTAL) 25 MG tablet Take 25 mg by mouth at bedtime.     tamoxifen (NOLVADEX) 20 MG tablet Take 1 tablet (20 mg total) by mouth daily. Qty: 30 tablet, Refills: 6   Associated Diagnoses: Malignant neoplasm of right female breast, unspecified site of breast (HCC)    temazepam (RESTORIL) 7.5 MG capsule Take 7.5 mg by mouth at bedtime as needed for sleep.    vitamin C (ASCORBIC ACID) 500 MG tablet Take 500 mg by mouth daily.      STOP taking these medications     nystatin ointment (MYCOSTATIN)      triamcinolone ointment (KENALOG) 0.1 %          DISCHARGE INSTRUCTIONS:   1. PCP f/u for blood pressure in 1 week 2. F/u with surgery Dr. Jamal Collin in 1 week for drain removal  If you experience worsening of your admission symptoms, develop shortness of breath, life threatening emergency, suicidal or homicidal thoughts you must seek medical attention immediately by calling 911 or calling your MD immediately  if symptoms less severe.  You Must read complete instructions/literature along with all the possible adverse reactions/side effects for all the Medicines you take and that have been prescribed to you. Take any new Medicines after you have completely understood and accept all the possible adverse reactions/side effects.   Please note  You were cared for by a hospitalist during your hospital stay. If you have any questions about your discharge medications or the care you received while you  were in the hospital after you are discharged, you can call the unit and asked to speak with the hospitalist on call if the hospitalist that took care of you is not available. Once you are discharged, your primary care physician will handle any further medical issues. Please note that NO REFILLS for any discharge medications will be authorized once you are discharged, as it is imperative that you return to your primary care physician (or establish a relationship with a primary care physician if you do not have one) for your aftercare needs so that they can reassess your need for medications and monitor your lab values.    Today   CHIEF COMPLAINT:   Chief Complaint  Patient presents with  . Post-op Problem    VITAL SIGNS:  Blood pressure 178/87, pulse 65, temperature 98.2 F (36.8 C), temperature source Oral, resp. rate 22, height 5' 7"  (1.702 m), weight 98.113 kg (216 lb 4.8 oz), last menstrual period 01/07/2015, SpO2 98 %.  I/O:   Intake/Output Summary (Last  24 hours) at 02/02/15 1047 Last data filed at 02/02/15 0947  Gross per 24 hour  Intake    510 ml  Output    850 ml  Net   -340 ml    PHYSICAL EXAMINATION:   Physical Exam  GENERAL: 44 y.o.-year-old obese patient lying in the bed with no acute distress.  EYES: Pupils equal, round, reactive to light and accommodation. No scleral icterus. Extraocular muscles intact.  HEENT: Head atraumatic, normocephalic. Oropharynx and nasopharynx clear.  NECK: Supple, no jugular venous distention. No thyroid enlargement, no tenderness.  LUNGS: Normal breath sounds bilaterally, no wheezing, rales,rhonchi or crepitation. No use of accessory muscles of respiration.  Chest- s/p right mastectomy, incision looks clean, no drainage from incision, the drain has clear serous drainage in it, resolved erythema and tenderness CARDIOVASCULAR: S1, S2 normal. No murmurs, rubs, or gallops.  ABDOMEN: Soft, nontender, nondistended. Bowel sounds  present. No organomegaly or mass.  EXTREMITIES: No pedal edema, cyanosis, or clubbing.  NEUROLOGIC: Cranial nerves II through XII are intact. Muscle strength 5/5 in all extremities. Sensation intact. Gait not checked.  PSYCHIATRIC: The patient is alert and oriented x 3.  SKIN: No obvious rash, lesion, or ulcer.   DATA REVIEW:   CBC  Recent Labs Lab 02/01/15 0404  WBC 10.5  HGB 8.4*  HCT 28.0*  PLT 310    Chemistries   Recent Labs Lab 02/01/15 0404  NA 138  K 3.8  CL 112*  CO2 22  GLUCOSE 101*  BUN 11  CREATININE 0.58  CALCIUM 8.5*    Cardiac Enzymes No results for input(s): TROPONINI in the last 168 hours.  Microbiology Results  Results for orders placed or performed during the hospital encounter of 01/30/15  Blood culture (routine x 2)     Status: None (Preliminary result)   Collection Time: 01/30/15 10:31 PM  Result Value Ref Range Status   Specimen Description BLOOD LEFT ANTECUBITAL  Final   Special Requests BOTTLES DRAWN AEROBIC AND ANAEROBIC 5ML  Final   Culture NO GROWTH 3 DAYS  Final   Report Status PENDING  Incomplete  Blood culture (routine x 2)     Status: None (Preliminary result)   Collection Time: 01/30/15 10:57 PM  Result Value Ref Range Status   Specimen Description BLOOD  Final   Special Requests NONE  Final   Culture NO GROWTH 3 DAYS  Final   Report Status PENDING  Incomplete  Body fluid culture     Status: None (Preliminary result)   Collection Time: 01/30/15 11:20 PM  Result Value Ref Range Status   Specimen Description DRAINAGE  Final   Special Requests NONE  Final   Gram Stain MODERATE WBC SEEN NO ORGANISMS SEEN   Final   Culture   Final    LIGHT GROWTH STAPHYLOCOCCUS AUREUS SUSCEPTIBILITIES TO FOLLOW    Report Status PENDING  Incomplete    RADIOLOGY:  No results found.  EKG:   Orders placed or performed during the hospital encounter of 01/11/15  . ED EKG  . ED EKG  . EKG      Management plans discussed with the  patient, family and they are in agreement.  CODE STATUS:     Code Status Orders        Start     Ordered   01/31/15 0138  Full code   Continuous     01/31/15 0137      TOTAL TIME TAKING CARE OF THIS PATIENT: 37 minutes.  Gladstone Lighter M.D on 02/02/2015 at 10:47 AM  Between 7am to 6pm - Pager - [phone]  After 6pm go to [email or web address] - password [email or web address] [place] [facility]  Office  [phone]  CC: Primary care physician; Lavonne Chick, MD

## 2015-02-03 LAB — BODY FLUID CULTURE

## 2015-02-03 LAB — ANAEROBIC AND AEROBIC CULTURE

## 2015-02-04 ENCOUNTER — Telehealth: Payer: Self-pay | Admitting: *Deleted

## 2015-02-04 NOTE — Telephone Encounter (Signed)
Hubert Azure, [place] infection control nurse called asking if the jp drain had been removed and a new seroma drain inserted. Wound with MRSA and she had questions about drains. I discussed both drain information with her and asked that she call you with any other questions.

## 2015-02-05 LAB — CULTURE, BLOOD (ROUTINE X 2)
CULTURE: NO GROWTH
Culture: NO GROWTH

## 2015-02-07 ENCOUNTER — Emergency Department
Admission: EM | Admit: 2015-02-07 | Discharge: 2015-02-07 | Discharge status: Against Medical Advice | Payer: Medicaid Other | Attending: Emergency Medicine | Admitting: Emergency Medicine

## 2015-02-07 ENCOUNTER — Ambulatory Visit: Payer: Medicaid Other | Admitting: General Surgery

## 2015-02-07 ENCOUNTER — Emergency Department: Payer: Medicaid Other

## 2015-02-07 ENCOUNTER — Encounter: Payer: Self-pay | Admitting: General Surgery

## 2015-02-07 ENCOUNTER — Ambulatory Visit (INDEPENDENT_AMBULATORY_CARE_PROVIDER_SITE_OTHER): Payer: Medicaid Other | Admitting: General Surgery

## 2015-02-07 VITALS — BP 130/72 | HR 114 | Temp 97.1°F | Resp 12 | Ht 67.0 in | Wt 209.0 lb

## 2015-02-07 DIAGNOSIS — Z792 Long term (current) use of antibiotics: Secondary | ICD-10-CM | POA: Diagnosis not present

## 2015-02-07 DIAGNOSIS — Z87891 Personal history of nicotine dependence: Secondary | ICD-10-CM | POA: Diagnosis not present

## 2015-02-07 DIAGNOSIS — I10 Essential (primary) hypertension: Secondary | ICD-10-CM | POA: Diagnosis not present

## 2015-02-07 DIAGNOSIS — I517 Cardiomegaly: Secondary | ICD-10-CM | POA: Insufficient documentation

## 2015-02-07 DIAGNOSIS — C50211 Malignant neoplasm of upper-inner quadrant of right female breast: Secondary | ICD-10-CM

## 2015-02-07 DIAGNOSIS — IMO0001 Reserved for inherently not codable concepts without codable children: Secondary | ICD-10-CM

## 2015-02-07 DIAGNOSIS — R5383 Other fatigue: Secondary | ICD-10-CM | POA: Insufficient documentation

## 2015-02-07 DIAGNOSIS — Z79899 Other long term (current) drug therapy: Secondary | ICD-10-CM | POA: Insufficient documentation

## 2015-02-07 DIAGNOSIS — R079 Chest pain, unspecified: Secondary | ICD-10-CM

## 2015-02-07 DIAGNOSIS — T792XXD Traumatic secondary and recurrent hemorrhage and seroma, subsequent encounter: Principal | ICD-10-CM

## 2015-02-07 DIAGNOSIS — T814XXD Infection following a procedure, subsequent encounter: Principal | ICD-10-CM

## 2015-02-07 LAB — BASIC METABOLIC PANEL
Anion gap: 12 (ref 5–15)
BUN: 23 mg/dL — AB (ref 6–20)
CHLORIDE: 107 mmol/L (ref 101–111)
CO2: 20 mmol/L — ABNORMAL LOW (ref 22–32)
CREATININE: 1.11 mg/dL — AB (ref 0.44–1.00)
Calcium: 9.5 mg/dL (ref 8.9–10.3)
GFR calc Af Amer: >60 mL/min (ref >60)
GFR calc non Af Amer: 59 mL/min — ABNORMAL LOW (ref >60)
GLUCOSE: 116 mg/dL — AB (ref 65–99)
POTASSIUM: 4 mmol/L (ref 3.5–5.1)
SODIUM: 139 mmol/L (ref 135–145)

## 2015-02-07 LAB — TROPONIN I

## 2015-02-07 NOTE — Progress Notes (Addendum)
This is a 44 year old old who return today because of her having fever and chills. She had been here earlier and noted no signs of infection. Drain removed-not sure if this was causing her chills/fever. Her temp now is normal. She is anemic and was transfused when she was in hospital last week. If she has recurrence of seroma will plan for open drainage with placement of a JP drain. Advised that she discuss her anemia with her PCP.

## 2015-02-07 NOTE — ED Notes (Signed)
History of Right Mastectomy (8/21) and has had a drain in wound since.  Patient has had a MRSA infection to surgical site.  Seen by surgeon today, drain removed.  Patient is concerned that surgical infection is causing this pain.  Just D/C from hospital on Saturday.

## 2015-02-07 NOTE — Patient Instructions (Signed)
Follow up in one week

## 2015-02-07 NOTE — ED Notes (Signed)
This RN was busy with critical pt next door and was unable to round on pt. Pt was awaiting a blood draw and this RN had asked another RN to attempt blood collection but RN was unable to after two attempts. This RN was unable to return to pts room and when critical pt was taken out of ED this pt had already left the ED AMA. Pt was in no acute distress at last time of RN assessment.

## 2015-02-07 NOTE — Progress Notes (Signed)
This is a 44 year old female here today for her one week follow up right breast seroma with drain. States she is having about 30 mL fluid draining twice daily. Has remained afebrile since discharge from hospital other than last night when she states she did have fever and chills.  I have reviewed the history of present illness with the patient. Initial culture had no growth but subsequent c/s did grow MRSA. Mastectomy site much improved, no redness, warmth, or fluctuance around incision. Yellow serous fluid present in drain.   Complete last 2 days of doxycycline, will leave drain in place for now. Follow up 1 week to reassess.

## 2015-02-07 NOTE — ED Provider Notes (Signed)
[facility] Emergency Department Provider Note   ____________________________________________  Time seen: on arrival to room I have reviewed the triage vital signs and the triage nursing note.  HISTORY  Chief Complaint Chest Pain   Historian Patient  HPI Elizabeth Flynn is a 44 y.o. female is here for evaluation of right chest wall pain. She has had a mastectomy this summer with Dr.Sankar, complicated by indwelling drain. Approximately one week ago she was evaluated for pus drainage which was concern for MRSA infection. She was treated with vancomycin in the hospital and then discharged home on doxycycline and she has 2 more days left of this antibiotic. She is felt fatigue and some central chest discomfort for couple of days and she is concerned that the infection is not gone. She did see Dr.Sankar in the office today because of concern about drainage being pus into the drain. Reportedly he did not think that the fluid in the drain was pus and told her it was normal, but that he could go ahead and remove the drain completely which he did.  Patient reports low-grade temperature of 99 yesterday.  No skin changes, redness, nor rash.    Past Medical History  Diagnosis Date  . Kidney stones   . Anemia   . Ovarian cyst   . Blood transfusion without reported diagnosis   . Hypertension   . Stroke [facility])     Sept. 2014  . Anxiety   . Headache   . MRSA infection 2009  . Dysrhythmia     TACHYCARDIA  . Enlarged heart   . BRCA negative 10-16-14  . PONV (postoperative nausea and vomiting)     DRY HEAVING AFTER BREAST LUMPECTOMY 10-2014  . Cancer of right breast ([place]) 10/16/2014    Upper and inner quadrant right breast, Invasive ductal carcinoma, Atypical Ductal Hyperplasia, ER/PR Pos, Her 2 Neg    Patient Active Problem List   Diagnosis Date Noted  . Cellulitis of chest wall 01/31/2015  . Blister of right chest wall with infection 01/30/2015  . Menorrhagia  01/17/2015  . Cervical stenosis (uterine cervix) 01/17/2015  . Cancer of right breast ([place]) 10/16/2014  . Absolute anemia 12/07/2012  . Clinical depression 12/07/2012  . Cerebrovascular accident, old 12/07/2012  . Near syncope 12/07/2012  . BP (high blood pressure) 12/07/2012    Past Surgical History  Procedure Laterality Date  . Cholecystectomy    . Tonsillectomy    . Hernia repair    . Tubal ligation    . Lithotripsy    . Breast lumpectomy with sentinel lymph node biopsy Right 10/23/2014    Procedure: Right breast lumpectomy with sentinel node biopsy ;  Surgeon: Christene Lye, MD;  Location: [facility] ORS;  Service: General;  Laterality: Right;  . Mastectomy modified radical Right 12/05/2014    Procedure: MASTECTOMY MODIFIED RADICAL;  Surgeon: Christene Lye, MD;  Location: [facility] ORS;  Service: General;  Laterality: Right;  . Breast biopsy Right 10/10/2014    Current Outpatient Rx  Name  Route  Sig  Dispense  Refill  . acetaminophen (TYLENOL) 500 MG tablet   Oral   Take 2 tablets (1,000 mg total) by mouth every 6 (six) hours as needed. For pain   30 tablet   0   . amLODipine (NORVASC) 10 MG tablet   Oral   Take 1 tablet (10 mg total) by mouth daily.   30 tablet   0   . doxycycline (DORYX) 100 MG EC tablet  Oral   Take 1 tablet (100 mg total) by mouth 2 (two) times daily.   14 tablet   0   . ferrous sulfate 325 (65 FE) MG tablet   Oral   Take 325 mg by mouth daily.         Marland Kitchen FLUoxetine (PROZAC) 10 MG capsule   Oral   Take 10 mg by mouth daily.         Marland Kitchen lamoTRIgine (LAMICTAL) 25 MG tablet   Oral   Take 25 mg by mouth at bedtime.          Marland Kitchen lisinopril (PRINIVIL,ZESTRIL) 20 MG tablet   Oral   Take 1 tablet (20 mg total) by mouth every evening.   30 tablet   0   . tamoxifen (NOLVADEX) 20 MG tablet   Oral   Take 1 tablet (20 mg total) by mouth daily.   30 tablet   6   . temazepam (RESTORIL) 7.5 MG capsule   Oral   Take 7.5 mg by mouth at  bedtime as needed for sleep.         . vitamin C (ASCORBIC ACID) 500 MG tablet   Oral   Take 500 mg by mouth daily.         Marland Kitchen oxyCODONE-acetaminophen (PERCOCET) 10-325 MG tablet   Oral   Take 1 tablet by mouth every 6 (six) hours as needed for pain. Patient not taking: Reported on 02/07/2015   20 tablet   0     Allergies Hydrocodone-acetaminophen; Reglan; Clindamycin/lincomycin; and Morphine and related  Family History  Problem Relation Age of Onset  . Breast cancer Cousin 62  . Heart disease Maternal Grandmother   . Diabetes Paternal Grandmother   . Heart disease Paternal Grandmother   . Ovarian cancer Neg Hx   . Colon cancer Neg Hx     Social History Social History  Substance Use Topics  . Smoking status: Former Smoker    Types: Cigarettes  . Smokeless tobacco: Not on file  . Alcohol Use: No     Comment: SOBER FOR 3 UMPNT(6144)    Review of Systems  Constitutional: as per history of present illness. Eyes: Negative for visual changes. ENT: Negative for sore throat. Cardiovascular: mild central and right-sided chest "pressure" Respiratory: Negative for shortness of breath. Gastrointestinal: Negative for abdominal pain, vomiting and diarrhea. Genitourinary: Negative for dysuria. Musculoskeletal: Negative for back pain. Skin: Negative for rash. Neurological: Negative for headache. 10 point Review of Systems otherwise negative ____________________________________________   PHYSICAL EXAM:  VITAL SIGNS: ED Triage Vitals  Enc Vitals Group     BP 02/07/15 1649 143/91 mmHg     Pulse Rate 02/07/15 1649 97     Resp 02/07/15 1649 18     Temp 02/07/15 1649 98.6 F (37 C)     Temp Source 02/07/15 1649 Oral     SpO2 02/07/15 1649 97 %     Weight 02/07/15 1649 209 lb (94.802 kg)     Height 02/07/15 1649 _0  (1.702 m)     Head Cir --      Peak Flow --      Pain Score 02/07/15 1650 7     Pain Loc --      Pain Edu? --      Excl. in [place]? --       Constitutional: Alert and oriented. Well appearing and in no distress. Eyes: Conjunctivae are normal. PERRL. Normal extraocular movements. ENT   Head: Normocephalic  and atraumatic.   Nose: No congestion/rhinnorhea.   Mouth/Throat: Mucous membranes are moist.   Neck: No stridor. Cardiovascular/Chest: Normal rate, regular rhythm.  No murmurs, rubs, or gallops.right mastectomy scar with no evidence of induration, redness, cellulitis, or rash. Tiny wound where drain was recently removed. No drainage. Respiratory: Normal respiratory effort without tachypnea nor retractions. Breath sounds are clear and equal bilaterally. No wheezes/rales/rhonchi. Gastrointestinal: Soft. No distention, no guarding, no rebound. Nontender   Genitourinary/rectal:Deferred Musculoskeletal: Nontender with normal range of motion in all extremities. No joint effusions.  No lower extremity tenderness.  No edema. Neurologic:  Normal speech and language. No gross or focal neurologic deficits are appreciated. Skin:  Skin is warm, dry and intact. No rash noted. Psychiatric: very anxious.speech and behavior are normal. Patient exhibits appropriate insight and judgment.  ____________________________________________   EKG I, Lisa Roca, MD, the attending physician have personally viewed and interpreted all ECGs.  96 bpm. Normal sinus rhythm. Narrow QRS. Normal axis. Nonspecific ST and T-wave ____________________________________________  LABS (pertinent positives/negatives)  Basic metabolic panel significant for BUN 23 and creatinine 1.11 Troponin less than 0.03  ____________________________________________  RADIOLOGY All Xrays were viewed by me. Imaging interpreted by Radiologist.  Chest x-ray two-view: No evidence for acute cardiopulmonary abnormality __________________________________________  PROCEDURES  Procedure(s) performed: None  Critical Care performed:  None  ____________________________________________   ED COURSE / ASSESSMENT AND PLAN  CONSULTATIONS: None  Pertinent labs & imaging results that were available during my care of the patient were reviewed by me and considered in my medical decision making (see chart for details).   The patient arrived here highly anxious and concerned about whether or not she may still have an infection. She did come here after seeing her surgeon Dr. Jamal Collin who pulled her drain and did not feel there is any active infection. She is currently on doxycycline for 2 more days. I discussed with her that her vital signs are reassuring, and she has not had a true fever. Her skin appears normal. I do not suspect that the chest discomfort is related to acute coronary syndrome  She's not having pleuritic chest pain, nor tachycardia, nor hypoxia, and I'm not suspicious of PE.  I discussed with the patient obtaining CBC, however lab called for a redraw.  Patient was not in the room when the nurse and I rechecked for her presence. Patient eloped.  I had no high suspicion about emergency condition. I had I think alleviated some of the patient's anxiety. We had discussed return precautions, with the presumption that she would have likely been discharged today so long as her white blood cell count had been normal.   ___________________________________________   FINAL CLINICAL IMPRESSION(S) / ED DIAGNOSES   Final diagnoses:  Chest pain, unspecified       Lisa Roca, MD 02/07/15 2337

## 2015-02-07 NOTE — ED Notes (Signed)
MD at bedside. 

## 2015-02-13 ENCOUNTER — Ambulatory Visit (INDEPENDENT_AMBULATORY_CARE_PROVIDER_SITE_OTHER): Payer: Medicaid Other | Admitting: General Surgery

## 2015-02-13 ENCOUNTER — Encounter: Payer: Self-pay | Admitting: General Surgery

## 2015-02-13 VITALS — BP 142/90 | HR 76 | Resp 14 | Ht 67.0 in | Wt 206.0 lb

## 2015-02-13 DIAGNOSIS — C50211 Malignant neoplasm of upper-inner quadrant of right female breast: Secondary | ICD-10-CM

## 2015-02-13 NOTE — Progress Notes (Signed)
Here today for her follow up for breast cancer. Patient had a right mastectomy done on 12/05/14. Seroma cath was removed last week. Denies any fever or chills. She states she is feeling better. She did go to the ED on 02-07-15 for chest pain, cardiac testing was negative. She stopped the tamoxifen on 02-07-15. No chest pain since she stopped the tamoxifen. I have reviewed the history of present illness with the patient.  Mastectomy site shows no evidence of seroma and no signs of infection.   Patient has follow up scheduled with Dr. Oliva Bustard in January, but recommended an appointment for earlier to discuss antihormonal therapy. Will set up appointment today. Patient to follow up in one month.    Right breast invasive carcinoma. T1A, N0. ERPR positive and HER2 negative.

## 2015-02-13 NOTE — Patient Instructions (Addendum)
The patient is aware to call back for any questions or concerns. Follow up one month

## 2015-02-14 ENCOUNTER — Encounter: Payer: Self-pay | Admitting: General Surgery

## 2015-02-21 ENCOUNTER — Telehealth: Payer: Self-pay | Admitting: *Deleted

## 2015-02-21 NOTE — Telephone Encounter (Signed)
Patient states her heart is racing on her anti-hormonal medication.  She has follow up appt. In January however, Dr. Jamal Collin advised her to see Dr. Oliva Bustard regarding this problem.   Per MD - Stop medication.  Add to December schedule for follow up.  Called patient - informed her per MD verbal order to stop medication.  We will schedule her to be seen in December. Notified scheduling. Verbalized understanding.

## 2015-02-25 ENCOUNTER — Emergency Department: Payer: Medicaid Other

## 2015-02-25 ENCOUNTER — Encounter: Payer: Self-pay | Admitting: Emergency Medicine

## 2015-02-25 ENCOUNTER — Emergency Department
Admission: EM | Admit: 2015-02-25 | Discharge: 2015-02-25 | Disposition: A | Discharge status: Home / Self Care | Payer: Medicaid Other | Attending: Emergency Medicine | Admitting: Emergency Medicine

## 2015-02-25 DIAGNOSIS — L03113 Cellulitis of right upper limb: Secondary | ICD-10-CM | POA: Insufficient documentation

## 2015-02-25 DIAGNOSIS — I10 Essential (primary) hypertension: Secondary | ICD-10-CM | POA: Diagnosis not present

## 2015-02-25 DIAGNOSIS — R079 Chest pain, unspecified: Secondary | ICD-10-CM | POA: Insufficient documentation

## 2015-02-25 DIAGNOSIS — Z79899 Other long term (current) drug therapy: Secondary | ICD-10-CM | POA: Insufficient documentation

## 2015-02-25 DIAGNOSIS — Z87891 Personal history of nicotine dependence: Secondary | ICD-10-CM | POA: Diagnosis not present

## 2015-02-25 LAB — CBC WITH DIFFERENTIAL/PLATELET
Basophils Absolute: 0.1 10*3/uL (ref 0–0.1)
Basophils Relative: 1 %
EOS ABS: 0.1 10*3/uL (ref 0–0.7)
EOS PCT: 1 %
HCT: 33.2 % — ABNORMAL LOW (ref 35.0–47.0)
Hemoglobin: 10.4 g/dL — ABNORMAL LOW (ref 12.0–16.0)
LYMPHS ABS: 2.6 10*3/uL (ref 1.0–3.6)
LYMPHS PCT: 26 %
MCH: 22.7 pg — AB (ref 26.0–34.0)
MCHC: 31.3 g/dL — AB (ref 32.0–36.0)
MCV: 72.5 fL — AB (ref 80.0–100.0)
MONO ABS: 0.8 10*3/uL (ref 0.2–0.9)
MONOS PCT: 7 %
Neutro Abs: 6.6 10*3/uL — ABNORMAL HIGH (ref 1.4–6.5)
Neutrophils Relative %: 65 %
PLATELETS: 272 10*3/uL (ref 150–440)
RBC: 4.58 MIL/uL (ref 3.80–5.20)
RDW: 23.6 % — AB (ref 11.5–14.5)
WBC: 10.2 10*3/uL (ref 3.6–11.0)

## 2015-02-25 LAB — COMPREHENSIVE METABOLIC PANEL
ALBUMIN: 3.5 g/dL (ref 3.5–5.0)
ALT: 9 U/L — AB (ref 14–54)
AST: 19 U/L (ref 15–41)
Alkaline Phosphatase: 41 U/L (ref 38–126)
Anion gap: 9 (ref 5–15)
BUN: 10 mg/dL (ref 6–20)
CHLORIDE: 102 mmol/L (ref 101–111)
CO2: 19 mmol/L — ABNORMAL LOW (ref 22–32)
CREATININE: 0.72 mg/dL (ref 0.44–1.00)
Calcium: 8.1 mg/dL — ABNORMAL LOW (ref 8.9–10.3)
GFR calc Af Amer: >60 mL/min (ref >60)
GLUCOSE: 110 mg/dL — AB (ref 65–99)
POTASSIUM: 3.5 mmol/L (ref 3.5–5.1)
Sodium: 130 mmol/L — ABNORMAL LOW (ref 135–145)
Total Bilirubin: 0.4 mg/dL (ref 0.3–1.2)
Total Protein: 7 g/dL (ref 6.5–8.1)

## 2015-02-25 MED ORDER — TRAMADOL HCL 50 MG PO TABS: 50.0000 mg | ORAL_TABLET | Freq: Four times a day (QID) | ORAL | Status: DC | PRN

## 2015-02-25 MED ORDER — CEPHALEXIN 500 MG PO CAPS: 500.0000 mg | ORAL_CAPSULE | Freq: Four times a day (QID) | ORAL | Status: AC

## 2015-02-25 NOTE — ED Provider Notes (Signed)
[facility] Emergency Department Provider Note    ____________________________________________  Time seen: 1645  I have reviewed the triage vital signs and the nursing notes.   HISTORY  Chief Complaint Breast Pain   History limited by: Not Limited   HPI Elizabeth Flynn is a 44 y.o. female who presents to the emergency department today with concerns for chest pain and swelling. Patient is status post mastectomy a few months ago with subsequent MRSA infection who presents to the emergency department today with roughly 3-4 days of increasing pain and swelling. The patient states the pain is located over her sternum. She describes it as gradually getting worse over the past couple of days. She states that at times it feels like it radiates down into her upper abdomen. In addition to the pain the patient feels she has been having some swelling overlying her sternum. She denies any associated fevers or shortness of breath.   Past Medical History  Diagnosis Date  . Kidney stones   . Anemia   . Ovarian cyst   . Blood transfusion without reported diagnosis   . Hypertension   . Stroke [facility])     Sept. 2014  . Anxiety   . Headache   . MRSA infection 2009  . Dysrhythmia     TACHYCARDIA  . Enlarged heart   . BRCA negative 10-16-14  . PONV (postoperative nausea and vomiting)     DRY HEAVING AFTER BREAST LUMPECTOMY 10-2014  . Cancer of right breast ([place]) 10/16/2014    Upper and inner quadrant right breast, Invasive ductal carcinoma, Atypical Ductal Hyperplasia, ER/PR Pos, Her 2 Neg    Patient Active Problem List   Diagnosis Date Noted  . Cellulitis of chest wall 01/31/2015  . Blister of right chest wall with infection 01/30/2015  . Menorrhagia 01/17/2015  . Cervical stenosis (uterine cervix) 01/17/2015  . Cancer of right breast ([place]) 10/16/2014  . Absolute anemia 12/07/2012  . Clinical depression 12/07/2012  . Cerebrovascular accident, old 12/07/2012  .  Near syncope 12/07/2012  . BP (high blood pressure) 12/07/2012    Past Surgical History  Procedure Laterality Date  . Cholecystectomy    . Tonsillectomy    . Hernia repair    . Tubal ligation    . Lithotripsy    . Breast lumpectomy with sentinel lymph node biopsy Right 10/23/2014    Procedure: Right breast lumpectomy with sentinel node biopsy ;  Surgeon: Christene Lye, MD;  Location: [facility] ORS;  Service: General;  Laterality: Right;  . Mastectomy modified radical Right 12/05/2014    Procedure: MASTECTOMY MODIFIED RADICAL;  Surgeon: Christene Lye, MD;  Location: [facility] ORS;  Service: General;  Laterality: Right;  . Breast biopsy Right 10/10/2014    Right breast invasive carcinoma. T1A, N0.    Current Outpatient Rx  Name  Route  Sig  Dispense  Refill  . acetaminophen (TYLENOL) 500 MG tablet   Oral   Take 2 tablets (1,000 mg total) by mouth every 6 (six) hours as needed. For pain   30 tablet   0   . amLODipine (NORVASC) 10 MG tablet   Oral   Take 1 tablet (10 mg total) by mouth daily.   30 tablet   0   . ferrous sulfate 325 (65 FE) MG tablet   Oral   Take 325 mg by mouth daily.         Marland Kitchen FLUoxetine (PROZAC) 10 MG capsule   Oral   Take 10  mg by mouth daily.         Marland Kitchen lamoTRIgine (LAMICTAL) 25 MG tablet   Oral   Take 25 mg by mouth at bedtime.          Marland Kitchen lisinopril (PRINIVIL,ZESTRIL) 20 MG tablet   Oral   Take 1 tablet (20 mg total) by mouth every evening.   30 tablet   0   . temazepam (RESTORIL) 7.5 MG capsule   Oral   Take 7.5 mg by mouth at bedtime as needed for sleep.         . vitamin C (ASCORBIC ACID) 500 MG tablet   Oral   Take 500 mg by mouth daily.           Allergies Hydrocodone-acetaminophen; Reglan; Clindamycin/lincomycin; and Morphine and related  Family History  Problem Relation Age of Onset  . Breast cancer Cousin 27  . Heart disease Maternal Grandmother   . Diabetes Paternal Grandmother   . Heart disease Paternal  Grandmother   . Ovarian cancer Neg Hx   . Colon cancer Neg Hx     Social History Social History  Substance Use Topics  . Smoking status: Former Smoker    Types: Cigarettes  . Smokeless tobacco: None  . Alcohol Use: No     Comment: SOBER FOR 3 ANVBT(6606)    Review of Systems  Constitutional: Negative for fever. Cardiovascular: Positive for chest pain. Respiratory: Negative for shortness of breath. Gastrointestinal: Negative for abdominal pain, vomiting and diarrhea. Genitourinary: Negative for dysuria. Musculoskeletal: Negative for back pain. Skin: Negative for rash. Neurological: Negative for headaches, focal weakness or numbness.  10-point ROS otherwise negative.  ____________________________________________   PHYSICAL EXAM:  VITAL SIGNS: ED Triage Vitals  Enc Vitals Group     BP 02/25/15 1332 151/67 mmHg     Pulse Rate 02/25/15 1332 80     Resp 02/25/15 1332 20     Temp 02/25/15 1335 98 F (36.7 C)     Temp Source 02/25/15 1335 Oral     SpO2 02/25/15 1332 100 %     Weight 02/25/15 1332 222 lb (100.699 kg)     Height 02/25/15 1332 _0  (1.702 m)     Head Cir --      Peak Flow --      Pain Score 02/25/15 1333 8   Constitutional: Alert and oriented. Well appearing and in no distress. Eyes: Conjunctivae are normal. PERRL. Normal extraocular movements. ENT   Head: Normocephalic and atraumatic.   Nose: No congestion/rhinnorhea.   Mouth/Throat: Mucous membranes are moist.   Neck: No stridor. Hematological/Lymphatic/Immunilogical: No cervical lymphadenopathy. Cardiovascular: Normal rate, regular rhythm.  No murmurs, rubs, or gallops. Respiratory: Normal respiratory effort without tachypnea nor retractions. Breath sounds are clear and equal bilaterally. No wheezes/rales/rhonchi. Gastrointestinal: Soft and nontender. No distention.  Genitourinary: Deferred Musculoskeletal: Normal range of motion in all extremities. No joint effusions.  No lower  extremity tenderness nor edema. Neurologic:  Normal speech and language. No gross focal neurologic deficits are appreciated.  Skin:  Skin is warm, dry and intact. Well healing mastectomy scar over right chest. No erythema, fluctuance or warmth appreciated. Minimally tender to palpation over the sternum. The patient does have a small area of cellulitis on the right forearm. Psychiatric: Mood and affect are normal. Speech and behavior are normal. Patient exhibits appropriate insight and judgment.  ____________________________________________    LABS (pertinent positives/negatives)  Labs Reviewed  COMPREHENSIVE METABOLIC PANEL - Abnormal; Notable for the following:  Sodium 130 (*)    CO2 19 (*)    Glucose, Bld 110 (*)    Calcium 8.1 (*)    ALT 9 (*)    All other components within normal limits  CBC WITH DIFFERENTIAL/PLATELET - Abnormal; Notable for the following:    Hemoglobin 10.4 (*)    HCT 33.2 (*)    MCV 72.5 (*)    MCH 22.7 (*)    MCHC 31.3 (*)    RDW 23.6 (*)    Neutro Abs 6.6 (*)    All other components within normal limits     ____________________________________________   EKG  None  ____________________________________________    RADIOLOGY   CXR  IMPRESSION: 1. Chronically elevated left hemidiaphragm with chronic blunting of the left costophrenic angle which could reflect a trace left pleural effusion. 2. Postoperative findings along the hiatus. 3. No acute findings.   ____________________________________________   PROCEDURES  Procedure(s) performed: None  Critical Care performed: No  ____________________________________________   INITIAL IMPRESSION / ASSESSMENT AND PLAN / ED COURSE  Pertinent labs & imaging results that were available during my care of the patient were reviewed by me and considered in my medical decision making (see chart for details).  Patient presents to the emergency department today with concerns for chest pain in the  setting of a mastectomy 3 months ago. On exam patient appears well. Blood work without any concerning findings. I highly doubt ACS given constant nature of pain, quality and lack of associated shortness of breath or diaphoresis. Additionally a chest x-ray was performed which does not show any pneumonias. Unclear etiology pain at this point. I will however prescribe patient a course of antibiotics given cellulitis on right forearm. Patient states she has follow-up appointment in 2 days.  ____________________________________________   FINAL CLINICAL IMPRESSION(S) / ED DIAGNOSES  Final diagnoses:  Cellulitis of right upper extremity  Chest pain, unspecified chest pain type     Nance Pear, MD 02/25/15 [id number]

## 2015-02-25 NOTE — ED Notes (Signed)
Pt has sternum pain and right mid back pain.  Pt had right mastectomy Dec 05 2014.  3 weeks ago pt admitted to [place] for infection in incision area.  Hx mrsa.  No drainage or infection noted at mastectomy incision.    Pt has red area on right forearm with increased pain.  No drainage.

## 2015-02-25 NOTE — Discharge Instructions (Signed)
Please seek medical attention for any high fevers, chest pain, shortness of breath, change in behavior, persistent vomiting, bloody stool or any other new or concerning symptoms. ° ° °Cellulitis °Cellulitis is an infection of the skin and the tissue beneath it. The infected area is usually red and tender. Cellulitis occurs most often in the arms and lower legs.  °CAUSES  °Cellulitis is caused by bacteria that enter the skin through cracks or cuts in the skin. The most common types of bacteria that cause cellulitis are staphylococci and streptococci. °SIGNS AND SYMPTOMS  °· Redness and warmth. °· Swelling. °· Tenderness or pain. °· Fever. °DIAGNOSIS  °Your health care provider can usually determine what is wrong based on a physical exam. Blood tests may also be done. °TREATMENT  °Treatment usually involves taking an antibiotic medicine. °HOME CARE INSTRUCTIONS  °· Take your antibiotic medicine as directed by your health care provider. Finish the antibiotic even if you start to feel better. °· Keep the infected arm or leg elevated to reduce swelling. °· Apply a warm cloth to the affected area up to 4 times per day to relieve pain. °· Take medicines only as directed by your health care provider. °· Keep all follow-up visits as directed by your health care provider. °SEEK MEDICAL CARE IF:  °· You notice red streaks coming from the infected area. °· Your red area gets larger or turns dark in color. °· Your bone or joint underneath the infected area becomes painful after the skin has healed. °· Your infection returns in the same area or another area. °· You notice a swollen bump in the infected area. °· You develop new symptoms. °· You have a fever. °SEEK IMMEDIATE MEDICAL CARE IF:  °· You feel very sleepy. °· You develop vomiting or diarrhea. °· You have a general ill feeling (malaise) with muscle aches and pains. °  °This information is not intended to replace advice given to you by your health care provider. Make sure  you discuss any questions you have with your health care provider. °  °Document Released: 12/31/2004 Document Revised: 12/12/2014 Document Reviewed: 06/08/2011 °Elsevier Interactive Patient Education ©2016 [employer] ° °

## 2015-02-25 NOTE — ED Notes (Addendum)
Pt post mastectomy back in august and started having right side breast pain and tenderness with some swelling three days ago. Also states she is swollen in her hands and is retaining fluid.  No Redness noted to right side breast area, incision noted wnl with some puffiness noted.

## 2015-03-12 ENCOUNTER — Inpatient Hospital Stay: Payer: Medicaid Other | Admitting: Oncology

## 2015-03-12 ENCOUNTER — Inpatient Hospital Stay: Payer: Medicaid Other

## 2015-03-14 ENCOUNTER — Ambulatory Visit (INDEPENDENT_AMBULATORY_CARE_PROVIDER_SITE_OTHER): Payer: Medicaid Other | Admitting: Obstetrics and Gynecology

## 2015-03-14 ENCOUNTER — Other Ambulatory Visit: Payer: Medicaid Other

## 2015-03-14 ENCOUNTER — Encounter: Payer: Self-pay | Admitting: Obstetrics and Gynecology

## 2015-03-14 VITALS — BP 155/85 | HR 102 | Ht 67.0 in | Wt 213.1 lb

## 2015-03-14 DIAGNOSIS — Z01818 Encounter for other preprocedural examination: Secondary | ICD-10-CM

## 2015-03-14 DIAGNOSIS — C50911 Malignant neoplasm of unspecified site of right female breast: Secondary | ICD-10-CM

## 2015-03-14 DIAGNOSIS — N882 Stricture and stenosis of cervix uteri: Secondary | ICD-10-CM

## 2015-03-14 DIAGNOSIS — N921 Excessive and frequent menstruation with irregular cycle: Secondary | ICD-10-CM

## 2015-03-14 NOTE — H&P (Signed)
PREOPERATIVE HISTORY AND PHYSICAL  Date of surgery: 03/18/2015 Chief complaint: 1.  Menorrhagia. 2.  Breast cancer on tamoxifen therapy. 3.  Cervical stenosis  Subjective:    Patient is a 44 y.o. G5P2042fmale scheduled for Hysteroscopy/D&C. Indications for procedure are Menorrhagia, with inability to perform endometrial biopsy in office; on tamoxifen therapy for breast cancer, and cervical stenosis.  Gynecologic history: Menarche age 44 Cycles regular until age 89 When they became extremely heavy and occurring twice a month. Cycles prior to age [age]were monthly, but very heavy. Patient reports having had 25 transfusions while living in [place] Workup included colonoscopy which was negative. Birth control pills did not control her menorrhagia. Most recent hemoglobin this week is reportedly at 8 g. Patient has never had a D&C for her menorrhagia. There has been no recent endometrial sampling. Attempted endometrial biopsy on 01/17/2015, was unsuccessful due to cervical stenosis    Menstrual History: OB History    Gravida Para Term Preterm AB TAB SAB Ectopic Multiple Living   5 2 2  3 1 2   2       Menarche age:[age] Patient's last menstrual period was 03/11/2015 (exact date).    Past Medical History  Diagnosis Date  . Kidney stones   . Anemia   . Ovarian cyst   . Blood transfusion without reported diagnosis   . Hypertension   . Stroke ([facility]     Sept. 2014  . Anxiety   . Headache   . MRSA infection 2009  . Dysrhythmia     TACHYCARDIA  . Enlarged heart   . BRCA negative 10-16-14  . PONV (postoperative nausea and vomiting)     DRY HEAVING AFTER BREAST LUMPECTOMY 10-2014  . [place]    Upper and inner quadrant right breast, Invasive ductal carcinoma, Atypical Ductal Hyperplasia, ER/PR Pos, Her 2 Neg    Past Surgical History  Procedure Laterality Date  . Cholecystectomy    . Tonsillectomy    . Hernia repair    . Tubal ligation     . Lithotripsy    . Breast lumpectomy with sentinel lymph node biopsy Right 10/23/2014    Procedure: Right breast lumpectomy with sentinel node biopsy ;  Surgeon: SChristene Lye MD;  Location: [facility] ORS;  Service: General;  Laterality: Right;  . Mastectomy modified radical Right 12/05/2014    Procedure: MASTECTOMY MODIFIED RADICAL;  Surgeon: SChristene Lye MD;  Location: [facility] ORS;  Service: General;  Laterality: Right;  . Breast biopsy Right 10/10/2014    Right breast invasive carcinoma. T1A, N0.    OB History  Gravida Para Term Preterm AB SAB TAB Ectopic Multiple Living  5 2 2  3 2 1   2     # Outcome Date GA Lbr Len/2nd Weight Sex Delivery Anes PTL Lv  5 Term 1997   7 lb 9.6 oz (3.447 kg) M Vag-Spont   Y  4 TAB 1992          3 SAB 1992          2 Term 1991   7 lb 4.8 oz (3.311 kg) M Vag-Spont   Y  1 SAB               Social History   Social History  . Marital Status: Divorced    Spouse Name: N/A  . Number of Children: N/A  . Years of Education: N/A   Occupational History  . not working  Social History Main Topics  . Smoking status: Former Smoker    Types: Cigarettes  . Smokeless tobacco: None  . Alcohol Use: No     Comment: SOBER FOR 3 [id number])  . Drug Use: No  . Sexual Activity: No   Other Topics Concern  . None   Social History Narrative    Family History  Problem Relation Age of Onset  . Breast cancer Cousin 32  . Heart disease Maternal Grandmother   . Diabetes Paternal Grandmother   . Heart disease Paternal Grandmother   . Ovarian cancer Neg Hx   . Colon cancer Neg Hx      (Not in a hospital admission)  Allergies  Allergen Reactions  . Hydrocodone-Acetaminophen Hives and Itching  . Reglan [Metoclopramide] Other (See Comments)    "freaks me out, makes me nervous"  . Clindamycin/Lincomycin Rash  . Morphine And Related Rash    Review of Systems Constitutional: No recent fever/chills/sweats Respiratory: No recent  cough/bronchitis Cardiovascular: No chest pain Gastrointestinal: No recent nausea/vomiting/diarrhea Genitourinary: No UTI symptoms Hematologic/lymphatic:No history of coagulopathy or recent blood thinner use    Objective:    BP 155/85 mmHg  Pulse 102  Ht 5' 7"  (1.702 m)  Wt 213 lb 1.6 oz (96.662 kg)  BMI 33.37 kg/m2  LMP 03/11/2015 (Exact Date)  General:   Normal  Skin:   normal  HEENT:  Normal  Neck:  Supple without Adenopathy or Thyromegaly  Lungs:   Heart:              Breasts:   Abdomen:  Pelvis:  M/S   Extremeties:  Neuro:    clear to auscultation bilaterally   Normal without murmur   Not Examined   soft, non-tender; bowel sounds normal; no masses,  no organomegaly   Exam deferred to OR  No CVAT  Warm/Dry   Normal          Assessment:  1.  Menorrhagia. 2.  Cervical stenosis. 3.  History of breast cancer, on tamoxifen. 4.  Ultrasound endometrial stripe measuring 10.3 mm   Plan:  1.  Hysteroscopy/D&C  Counseling: The patient is to undergo hysteroscopy/D&C for menorrhagia.  She is understanding of the planned procedure and is aware of and is accepting of all surgical risks which include but are not limited to bleeding, infection, pelvic organ injury with need for repair, uterine perforation, anesthesia risks, etc.  All questions have been answered.  Informed consent is given.  Patient is ready and willing to proceed with surgery as scheduled.  Brayton Mars, MD  Note: This dictation was prepared with Dragon dictation along with smaller phrase technology. Any transcriptional errors that result from this process are unintentional.

## 2015-03-14 NOTE — Progress Notes (Signed)
PREOPERATIVE HISTORY AND PHYSICAL  Date of surgery: 03/18/2015 Chief complaint: 1.  Menorrhagia. 2.  Breast cancer on tamoxifen therapy. 3.  Cervical stenosis  Subjective:    Patient is a 44 y.o. G5P2067fmale scheduled for Hysteroscopy/D&C. Indications for procedure are Menorrhagia, with inability to perform endometrial biopsy in office; on tamoxifen therapy for breast cancer, and cervical stenosis.  Gynecologic history: Menarche age 89 Cycles regular until age 44 When they became extremely heavy and occurring twice a month. Cycles prior to age [age]were monthly, but very heavy. Patient reports having had 25 transfusions while living in [place] Workup included colonoscopy which was negative. Birth control pills did not control her menorrhagia. Most recent hemoglobin this week is reportedly at 8 g. Patient has never had a D&C for her menorrhagia. There has been no recent endometrial sampling. Attempted endometrial biopsy on 01/17/2015, was unsuccessful due to cervical stenosis    Menstrual History: OB History    Gravida Para Term Preterm AB TAB SAB Ectopic Multiple Living   5 2 2  3 1 2   2       Menarche age:[age] Patient's last menstrual period was 03/11/2015 (exact date).    Past Medical History  Diagnosis Date  . Kidney stones   . Anemia   . Ovarian cyst   . Blood transfusion without reported diagnosis   . Hypertension   . Stroke ([facility]     Sept. 2014  . Anxiety   . Headache   . MRSA infection 2009  . Dysrhythmia     TACHYCARDIA  . Enlarged heart   . BRCA negative 10-16-14  . PONV (postoperative nausea and vomiting)     DRY HEAVING AFTER BREAST LUMPECTOMY 10-2014  . [place]    Upper and inner quadrant right breast, Invasive ductal carcinoma, Atypical Ductal Hyperplasia, ER/PR Pos, Her 2 Neg    Past Surgical History  Procedure Laterality Date  . Cholecystectomy    . Tonsillectomy    . Hernia repair    . Tubal ligation    .  Lithotripsy    . Breast lumpectomy with sentinel lymph node biopsy Right 10/23/2014    Procedure: Right breast lumpectomy with sentinel node biopsy ;  Surgeon: SChristene Lye MD;  Location: [facility] ORS;  Service: General;  Laterality: Right;  . Mastectomy modified radical Right 12/05/2014    Procedure: MASTECTOMY MODIFIED RADICAL;  Surgeon: SChristene Lye MD;  Location: [facility] ORS;  Service: General;  Laterality: Right;  . Breast biopsy Right 10/10/2014    Right breast invasive carcinoma. T1A, N0.    OB History  Gravida Para Term Preterm AB SAB TAB Ectopic Multiple Living  5 2 2  3 2 1   2     # Outcome Date GA Lbr Len/2nd Weight Sex Delivery Anes PTL Lv  5 Term 1997   7 lb 9.6 oz (3.447 kg) M Vag-Spont   Y  4 TAB 1992          3 SAB 1992          2 Term 1991   7 lb 4.8 oz (3.311 kg) M Vag-Spont   Y  1 SAB               Social History   Social History  . Marital Status: Divorced    Spouse Name: N/A  . Number of Children: N/A  . Years of Education: N/A   Occupational History  . not working  Social History Main Topics  . Smoking status: Former Smoker    Types: Cigarettes  . Smokeless tobacco: None  . Alcohol Use: No     Comment: SOBER FOR 3 [id number])  . Drug Use: No  . Sexual Activity: No   Other Topics Concern  . None   Social History Narrative    Family History  Problem Relation Age of Onset  . Breast cancer Cousin 55  . Heart disease Maternal Grandmother   . Diabetes Paternal Grandmother   . Heart disease Paternal Grandmother   . Ovarian cancer Neg Hx   . Colon cancer Neg Hx      (Not in a hospital admission)  Allergies  Allergen Reactions  . Hydrocodone-Acetaminophen Hives and Itching  . Reglan [Metoclopramide] Other (See Comments)    "freaks me out, makes me nervous"  . Clindamycin/Lincomycin Rash  . Morphine And Related Rash    Review of Systems Constitutional: No recent fever/chills/sweats Respiratory: No recent  cough/bronchitis Cardiovascular: No chest pain Gastrointestinal: No recent nausea/vomiting/diarrhea Genitourinary: No UTI symptoms Hematologic/lymphatic:No history of coagulopathy or recent blood thinner use    Objective:    BP 155/85 mmHg  Pulse 102  Ht 5' 7"  (1.702 m)  Wt 213 lb 1.6 oz (96.662 kg)  BMI 33.37 kg/m2  LMP 03/11/2015 (Exact Date)  General:   Normal  Skin:   normal  HEENT:  Normal  Neck:  Supple without Adenopathy or Thyromegaly  Lungs:   Heart:              Breasts:   Abdomen:  Pelvis:  M/S   Extremeties:  Neuro:    clear to auscultation bilaterally   Normal without murmur   Not Examined   soft, non-tender; bowel sounds normal; no masses,  no organomegaly   Exam deferred to OR  No CVAT  Warm/Dry   Normal          Assessment:  1.  Menorrhagia. 2.  Cervical stenosis. 3.  History of breast cancer, on tamoxifen. 4.  Ultrasound endometrial stripe measuring 10.3 mm   Plan:  1.  Hysteroscopy/D&C  Counseling: The patient is to undergo hysteroscopy/D&C for menorrhagia.  She is understanding of the planned procedure and is aware of and is accepting of all surgical risks which include but are not limited to bleeding, infection, pelvic organ injury with need for repair, uterine perforation, anesthesia risks, etc.  All questions have been answered.  Informed consent is given.  Patient is ready and willing to proceed with surgery as scheduled.  Brayton Mars, MD  Note: This dictation was prepared with Dragon dictation along with smaller phrase technology. Any transcriptional errors that result from this process are unintentional.

## 2015-03-14 NOTE — Patient Instructions (Signed)
  Your procedure is scheduled on: 03-18-15 [facility]) Report to [facility] To find out your arrival time please call [phone] between 1PM - 3PM on 03-15-15 (FRIDAY)  Remember: Instructions that are not followed completely may result in serious medical risk, up to and including death, or upon the discretion of your surgeon and anesthesiologist your surgery may need to be rescheduled.    _X___ 1. Do not eat food or drink liquids after midnight. No gum chewing or hard candies.     _X___ 2. No Alcohol for 24 hours before or after surgery.   ____ 3. Bring all medications with you on the day of surgery if instructed.    _X___ 4. Notify your doctor if there is any change in your medical condition     (cold, fever, infections).     Do not wear jewelry, make-up, hairpins, clips or nail polish.  Do not wear lotions, powders, or perfumes. You may wear deodorant.  Do not shave 48 hours prior to surgery. Men may shave face and neck.  Do not bring valuables to the hospital.    [facility] is not responsible for any belongings or valuables.               Contacts, dentures or bridgework may not be worn into surgery.  Leave your suitcase in the car. After surgery it may be brought to your room.  For patients admitted to the hospital, discharge time is determined by your  treatment team.   Patients discharged the day of surgery will not be allowed to drive home.   Please read over the following fact sheets that you were given:      _X___ Take these medicines the morning of surgery with A SIP OF WATER:    1. AMLODIPINE  2. PROZAC  3. LAMICTAL  4.  5.  6.  ____ Fleet Enema (as directed)   ____ Use CHG Soap as directed  ____ Use inhalers on the day of surgery  ____ Stop metformin 2 days prior to surgery    ____ Take 1/2 of usual insulin dose the night before surgery and none on the morning of surgery.   ____ Stop Coumadin/Plavix/aspirin-N/A  ____ Stop  Anti-inflammatories-NO NSAIDS OR ASPIRIN PRODUCTS-TYLENOL OK   _X___ Stop supplements until after surgery-STOP VITAMIN C NOW   ____ Bring C-Pap to the hospital.

## 2015-03-14 NOTE — Patient Instructions (Signed)
1. Return in 1 week after surgery for postop check 

## 2015-03-15 ENCOUNTER — Other Ambulatory Visit: Payer: Medicaid Other

## 2015-03-15 NOTE — Pre-Procedure Instructions (Signed)
PT WAS SUPPOSED TO COME IN FOR PREOP LABS TODAY (PT HAD PHONE INTERVIEW YESTERDAY 03-14-15) BUT PT CALLED THIS AM AND SAID THAT HER ROOM MATES CAR WOULD NOT START THIS MORNING AND THAT HER ROOM MATE TOOK HER CAR TO WORK WITH OUT HER KNOWLEDGE SO NOW SHE HAS NO WAY TO GET TO THE HOSPITAL FOR HER LABS-I INFORMED PT WE WILL HAVE TO DO LABS DAY OF SURGERY

## 2015-03-18 ENCOUNTER — Ambulatory Visit: Payer: Medicaid Other | Admitting: Anesthesiology

## 2015-03-18 ENCOUNTER — Encounter: Payer: Self-pay | Admitting: *Deleted

## 2015-03-18 ENCOUNTER — Ambulatory Visit
Admission: RE | Admit: 2015-03-18 | Discharge: 2015-03-18 | Disposition: A | Discharge status: Home / Self Care | Payer: Medicaid Other | Source: Ambulatory Visit | Attending: Obstetrics and Gynecology | Admitting: Obstetrics and Gynecology

## 2015-03-18 ENCOUNTER — Encounter
Admission: RE | Disposition: A | Discharge status: Home / Self Care | Payer: Self-pay | Source: Ambulatory Visit | Attending: Obstetrics and Gynecology

## 2015-03-18 DIAGNOSIS — Z8249 Family history of ischemic heart disease and other diseases of the circulatory system: Secondary | ICD-10-CM | POA: Diagnosis not present

## 2015-03-18 DIAGNOSIS — N882 Stricture and stenosis of cervix uteri: Secondary | ICD-10-CM | POA: Diagnosis not present

## 2015-03-18 DIAGNOSIS — Z888 Allergy status to other drugs, medicaments and biological substances status: Secondary | ICD-10-CM | POA: Diagnosis not present

## 2015-03-18 DIAGNOSIS — N83209 Unspecified ovarian cyst, unspecified side: Secondary | ICD-10-CM | POA: Insufficient documentation

## 2015-03-18 DIAGNOSIS — F419 Anxiety disorder, unspecified: Secondary | ICD-10-CM | POA: Insufficient documentation

## 2015-03-18 DIAGNOSIS — N920 Excessive and frequent menstruation with regular cycle: Secondary | ICD-10-CM

## 2015-03-18 DIAGNOSIS — I10 Essential (primary) hypertension: Secondary | ICD-10-CM | POA: Diagnosis not present

## 2015-03-18 DIAGNOSIS — Z803 Family history of malignant neoplasm of breast: Secondary | ICD-10-CM | POA: Insufficient documentation

## 2015-03-18 DIAGNOSIS — Z87891 Personal history of nicotine dependence: Secondary | ICD-10-CM | POA: Diagnosis not present

## 2015-03-18 DIAGNOSIS — N921 Excessive and frequent menstruation with irregular cycle: Secondary | ICD-10-CM

## 2015-03-18 DIAGNOSIS — Z833 Family history of diabetes mellitus: Secondary | ICD-10-CM | POA: Diagnosis not present

## 2015-03-18 DIAGNOSIS — Z885 Allergy status to narcotic agent status: Secondary | ICD-10-CM | POA: Diagnosis not present

## 2015-03-18 DIAGNOSIS — C50919 Malignant neoplasm of unspecified site of unspecified female breast: Secondary | ICD-10-CM

## 2015-03-18 DIAGNOSIS — N840 Polyp of corpus uteri: Secondary | ICD-10-CM | POA: Insufficient documentation

## 2015-03-18 DIAGNOSIS — Z79810 Long term (current) use of selective estrogen receptor modulators (SERMs): Secondary | ICD-10-CM | POA: Insufficient documentation

## 2015-03-18 DIAGNOSIS — Z8673 Personal history of transient ischemic attack (TIA), and cerebral infarction without residual deficits: Secondary | ICD-10-CM | POA: Insufficient documentation

## 2015-03-18 DIAGNOSIS — Z881 Allergy status to other antibiotic agents status: Secondary | ICD-10-CM | POA: Diagnosis not present

## 2015-03-18 DIAGNOSIS — Z87442 Personal history of urinary calculi: Secondary | ICD-10-CM | POA: Diagnosis not present

## 2015-03-18 HISTORY — PX: DILATATION & CURRETTAGE/HYSTEROSCOPY WITH RESECTOCOPE: SHX5572

## 2015-03-18 LAB — CBC WITH DIFFERENTIAL/PLATELET
BASOS ABS: 0.1 10*3/uL (ref 0–0.1)
Basophils Relative: 2 %
Eosinophils Absolute: 0.1 10*3/uL (ref 0–0.7)
Eosinophils Relative: 1 %
HEMATOCRIT: 32.3 % — AB (ref 35.0–47.0)
HEMOGLOBIN: 9.8 g/dL — AB (ref 12.0–16.0)
LYMPHS PCT: 27 %
Lymphs Abs: 1.8 10*3/uL (ref 1.0–3.6)
MCH: 22.3 pg — ABNORMAL LOW (ref 26.0–34.0)
MCHC: 30.4 g/dL — AB (ref 32.0–36.0)
MCV: 73.4 fL — AB (ref 80.0–100.0)
Monocytes Absolute: 0.5 10*3/uL (ref 0.2–0.9)
Monocytes Relative: 7 %
NEUTROS ABS: 4.2 10*3/uL (ref 1.4–6.5)
Neutrophils Relative %: 63 %
Platelets: 348 10*3/uL (ref 150–440)
RBC: 4.4 MIL/uL (ref 3.80–5.20)
RDW: 22.2 % — ABNORMAL HIGH (ref 11.5–14.5)
WBC: 6.7 10*3/uL (ref 3.6–11.0)

## 2015-03-18 LAB — RAPID HIV SCREEN (HIV 1/2 AB+AG)
HIV 1/2 Antibodies: NONREACTIVE
HIV-1 P24 ANTIGEN - HIV24: NONREACTIVE

## 2015-03-18 LAB — SURGICAL PCR SCREEN
MRSA, PCR: NEGATIVE
STAPHYLOCOCCUS AUREUS: NEGATIVE

## 2015-03-18 LAB — POCT PREGNANCY, URINE: PREG TEST UR: NEGATIVE

## 2015-03-18 SURGERY — DILATATION & CURETTAGE/HYSTEROSCOPY WITH RESECTOCOPE
Anesthesia: General

## 2015-03-18 MED ORDER — PROPOFOL 10 MG/ML IV BOLUS
INTRAVENOUS | Status: DC | PRN
  Administered 2015-03-18: 200 mg via INTRAVENOUS

## 2015-03-18 MED ORDER — MIDAZOLAM HCL 2 MG/2ML IJ SOLN
INTRAMUSCULAR | Status: DC | PRN
  Administered 2015-03-18: 2 mg via INTRAVENOUS

## 2015-03-18 MED ORDER — LACTATED RINGERS IV SOLN: INTRAVENOUS | Status: DC

## 2015-03-18 MED ORDER — ONDANSETRON HCL 4 MG/2ML IJ SOLN
INTRAMUSCULAR | Status: DC | PRN
  Administered 2015-03-18: 4 mg via INTRAVENOUS

## 2015-03-18 MED ORDER — TRAMADOL HCL 50 MG PO TABS
50.0000 mg | ORAL_TABLET | Freq: Once | ORAL | Status: AC
  Administered 2015-03-18: 50 mg via ORAL
  Filled 2015-03-18: qty 1

## 2015-03-18 MED ORDER — TRAMADOL HCL 50 MG PO TABS
ORAL_TABLET | ORAL | Status: AC
  Administered 2015-03-18: 50 mg via ORAL
  Filled 2015-03-18: qty 1

## 2015-03-18 MED ORDER — FENTANYL CITRATE (PF) 100 MCG/2ML IJ SOLN
25.0000 ug | INTRAMUSCULAR | Status: DC | PRN
  Administered 2015-03-18 (×4): 25 ug via INTRAVENOUS

## 2015-03-18 MED ORDER — FENTANYL CITRATE (PF) 100 MCG/2ML IJ SOLN
INTRAMUSCULAR | Status: DC | PRN
  Administered 2015-03-18 (×3): 25 ug via INTRAVENOUS
  Administered 2015-03-18: 50 ug via INTRAVENOUS
  Administered 2015-03-18: 25 ug via INTRAVENOUS

## 2015-03-18 MED ORDER — ACETAMINOPHEN 10 MG/ML IV SOLN
INTRAVENOUS | Status: AC
  Filled 2015-03-18: qty 100

## 2015-03-18 MED ORDER — PROPOFOL 500 MG/50ML IV EMUL
INTRAVENOUS | Status: DC | PRN
  Administered 2015-03-18: 200 ug/kg/min via INTRAVENOUS

## 2015-03-18 MED ORDER — ACETAMINOPHEN 10 MG/ML IV SOLN
INTRAVENOUS | Status: DC | PRN
  Administered 2015-03-18: 1000 mg via INTRAVENOUS

## 2015-03-18 MED ORDER — LIDOCAINE HCL (CARDIAC) 20 MG/ML IV SOLN
INTRAVENOUS | Status: DC | PRN
  Administered 2015-03-18: 40 mg via INTRAVENOUS

## 2015-03-18 MED ORDER — LACTATED RINGERS IV SOLN
INTRAVENOUS | Status: DC
  Administered 2015-03-18 (×2): via INTRAVENOUS

## 2015-03-18 MED ORDER — IBUPROFEN 800 MG PO TABS: 800.0000 mg | ORAL_TABLET | Freq: Three times a day (TID) | ORAL | Status: DC

## 2015-03-18 MED ORDER — FAMOTIDINE 20 MG PO TABS: 20.0000 mg | ORAL_TABLET | Freq: Once | ORAL | Status: DC

## 2015-03-18 MED ORDER — DEXAMETHASONE SODIUM PHOSPHATE 10 MG/ML IJ SOLN
INTRAMUSCULAR | Status: DC | PRN
  Administered 2015-03-18: 10 mg via INTRAVENOUS

## 2015-03-18 MED ORDER — FENTANYL CITRATE (PF) 100 MCG/2ML IJ SOLN
INTRAMUSCULAR | Status: AC
  Administered 2015-03-18: 25 ug via INTRAVENOUS
  Filled 2015-03-18: qty 2

## 2015-03-18 SURGICAL SUPPLY — 13 items
CATH ROBINSON RED A/P 16FR (CATHETERS) ×2 IMPLANT
GLOVE BIO SURGEON STRL SZ8 (GLOVE) ×8 IMPLANT
GOWN STRL REUS W/ TWL LRG LVL3 (GOWN DISPOSABLE) ×1 IMPLANT
GOWN STRL REUS W/ TWL XL LVL3 (GOWN DISPOSABLE) ×1 IMPLANT
GOWN STRL REUS W/TWL LRG LVL3 (GOWN DISPOSABLE) ×1
GOWN STRL REUS W/TWL XL LVL3 (GOWN DISPOSABLE) ×1
IV LACTATED RINGERS 1000ML (IV SOLUTION) ×2 IMPLANT
KIT RM TURNOVER CYSTO AR (KITS) ×2 IMPLANT
NS IRRIG 500ML POUR BTL (IV SOLUTION) ×2 IMPLANT
PACK DNC HYST (MISCELLANEOUS) ×2 IMPLANT
PAD OB MATERNITY 4.3X12.25 (PERSONAL CARE ITEMS) ×2 IMPLANT
PAD PREP 24X41 OB/GYN DISP (PERSONAL CARE ITEMS) ×2 IMPLANT
SURGILUBE 2OZ TUBE FLIPTOP (MISCELLANEOUS) ×2 IMPLANT

## 2015-03-18 NOTE — Discharge Instructions (Signed)
AMBULATORY SURGERY  DISCHARGE INSTRUCTIONS   1) The drugs that you were given will stay in your system until tomorrow so for the next 24 hours you should not:  A) Drive an automobile B) Make any legal decisions C) Drink any alcoholic beverage   2) You may resume regular meals tomorrow.  Today it is better to start with liquids and gradually work up to solid foods.  You may eat anything you prefer, but it is better to start with liquids, then soup and crackers, and gradually work up to solid foods.   3) Please notify your doctor immediately if you have any unusual bleeding, trouble breathing, redness and pain at the surgery site, drainage, fever, or pain not relieved by medication.    4) Additional Instructions:     Dilation and Curettage or Vacuum Curettage, Care After These instructions give you information on caring for yourself after your procedure. Your doctor may also give you more specific instructions. Call your doctor if you have any problems or questions after your procedure. HOME CARE  Do not drive for 24 hours.  Wait 1 week before doing any activities that wear you out.  Take your temperature 2 times a day for 4 days. Write it down. Tell your doctor if you have a fever.  Do not stand for a long time.  Do not lift, push, or pull anything over 10 pounds (4.5 kilograms).  Limit stair climbing to once or twice a day.  Rest often.  Continue with your usual diet.  Drink enough fluids to keep your pee (urine) clear or pale yellow.  If you have a hard time pooping (constipation), you may:  Take a medicine to help you go poop (laxative) as told by your doctor.  Eat more fruit and bran.  Drink more fluids.  Take showers, not baths, for as long as told by your doctor.  Do not swim or use a hot tub until your doctor says it is okay.  Have someone with you for 1-2 days after the procedure.  Do not douche, use tampons, or have sex (intercourse) for 2  weeks.  Only take medicines as told by your doctor. Do not take aspirin. It can cause bleeding.  Keep all doctor visits. GET HELP IF:  You have cramps or pain not helped by medicine.  You have new pain in the belly (abdomen).  You have a bad smelling fluid coming from your vagina.  You have a rash.  You have problems with any medicine. GET HELP RIGHT AWAY IF:   You start to bleed more than a regular period.  You have a fever.  You have chest pain.  You have trouble breathing.  You feel dizzy or feel like passing out (fainting).  You pass out.  You have pain in the tops of your shoulders.  You have vaginal bleeding with or without clumps of blood (blood clots). MAKE SURE YOU:  Understand these instructions.  Will watch your condition.  Will get help right away if you are not doing well or get worse.   This information is not intended to replace advice given to you by your health care provider. Make sure you discuss any questions you have with your health care provider.   Document Released: 12/31/2007 Document Revised: 03/28/2013 Document Reviewed: 10/20/2012 Elsevier Interactive Patient Education 2016 [place].    Please contact your physician with any problems or Same Day Surgery at [phone], Monday through Friday 6 am to 4 pm,  pm, or [facility] Main number at [phone]. °

## 2015-03-18 NOTE — Anesthesia Postprocedure Evaluation (Signed)
Anesthesia Post Note  Patient: Elizabeth Flynn  Procedure(s) Performed: Procedure(s) (LRB): DILATATION & CURETTAGE/HYSTEROSCOPY WITH RESECTOCOPE (N/A)  Patient location during evaluation: PACU Anesthesia Type: General Level of consciousness: awake and alert Pain management: pain level controlled Vital Signs Assessment: post-procedure vital signs reviewed and stable Respiratory status: spontaneous breathing, nonlabored ventilation, respiratory function stable and patient connected to nasal cannula oxygen Cardiovascular status: blood pressure returned to baseline and stable Postop Assessment: no signs of nausea or vomiting Anesthetic complications: no    Last Vitals:  Filed Vitals:   03/18/15 1223 03/18/15 1246  BP: 161/98 160/92  Pulse: 73 69  Temp: 36.9 C 36.3 C  Resp: 12 15    Last Pain:  Filed Vitals:   03/18/15 1248  PainSc: 6                  Broadus John K Ewel Lona

## 2015-03-18 NOTE — Op Note (Addendum)
OPERATIVE NOTE  Date of surgery: 03/18/2015  Preoperative diagnosis:  1. Menorrhagia 2. Cervical stenosis 3. Breast cancer, on tamoxifen  Postoperative diagnosis:  1. Menorrhagia 2. Cervical stenosis 3. Breast cancer, on tamoxifen 4. Endometrial polyps  Operative procedure: 1. Hysteroscopy 2. Dilation and curettage of endometrium  Surgeon: Dr. Zipporah Plants Assistant: None Anesthesia: Gen.   Findings: Pelvis: Gynecoid Uterus: sounded to 10 cm Normal appearing endometrial cavity with multiple endometrial polyps.  Description of procedure: Patient was brought to the operating room where she was placed in supine position. General anesthesia was induced without difficulty using the LMA technique. The patient was placed in dorsal lithotomy position using candy cane stirrups. A betadine perineal intravaginal prep and drape was performed in standard fashion. Weighted speculum was placed in the vagina. Single-tooth tenaculum was placed on the anterior cervix. Uterus was sounded to 10 cm. Hanks dilators were used up to a #20 [place] caliber to dilate the endocervical canal. The [facility] hysteroscope using lactated Ringer's as irrigant was used for the hysteroscopy. The above-noted findings were photo documented. The hysteroscope removed. The smooth and serrated curettes were then used to curettage the endometrial cavity with production of average tissue. Stone polyp forceps were used to grasp residual tissue left behind. Repeat hysteroscopy was performed and demonstrated excellent sampling. Procedure was then terminated with all instrumentation being removed from the vagina. The patient was then awakened mobilized and taken to the recovery room in satisfactory condition.  IV fluids: See anesthesia note Urine output: 100 mL. EBL: Less than 15 mL.  Instruments, needles, and sponge counts were verified as correct.  Brayton Mars, MD 03/18/2015   ADDENDUM: Should patient require  hysterectomy, transvaginal approach is recommended.

## 2015-03-18 NOTE — Anesthesia Preprocedure Evaluation (Addendum)
Anesthesia Evaluation  Patient identified by MRN, date of birth, ID band Patient awake    Reviewed: Allergy & Precautions, H&P , NPO status , Patient's Chart, lab work & pertinent test results  History of Anesthesia Complications (+) PONV, DIFFICULT IV STICK / SPECIAL LINE and history of anesthetic complications  Airway Mallampati: II  TM Distance: >3 FB Neck ROM: full    Dental no notable dental hx. (+) Teeth Intact   Pulmonary neg shortness of breath, former smoker,    Pulmonary exam normal breath sounds clear to auscultation       Cardiovascular Exercise Tolerance: Good hypertension, (-) angina(-) Past MI and (-) DOE Normal cardiovascular exam Rhythm:regular Rate:Normal     Neuro/Psych  Headaches, PSYCHIATRIC DISORDERS Anxiety Depression CVA    GI/Hepatic negative GI ROS, Neg liver ROS,   Endo/Other  negative endocrine ROS  Renal/GU Renal disease  negative genitourinary   Musculoskeletal   Abdominal   Peds  Hematology negative hematology ROS (+)   Anesthesia Other Findings Past Medical History:   Kidney stones                                                Anemia                                                       Ovarian cyst                                                 Blood transfusion without reported diagnosis                 Hypertension                                                 Stroke [facility])                                                   Comment:Sept. 2014   Anxiety                                                      Headache                                                     MRSA infection  2009         Dysrhythmia                                                    Comment:TACHYCARDIA   Enlarged heart                                               BRCA negative                                   10-16-14      PONV (postoperative nausea and vomiting)                        Comment:DRY HEAVING AFTER BREAST LUMPECTOMY 10-2014   Cancer of right breast ([place])                    10/16/2014      Comment:Upper and inner quadrant right breast, Invasive              ductal carcinoma, Atypical Ductal Hyperplasia,               ER/PR Pos, Her 2 Neg  Past Surgical History:   CHOLECYSTECTOMY                                               TONSILLECTOMY                                                 HERNIA REPAIR                                                 TUBAL LIGATION                                                LITHOTRIPSY                                                   BREAST LUMPECTOMY WITH SENTINEL LYMPH NODE BIO* Right 10/23/2014      Comment:Procedure: Right breast lumpectomy with               sentinel node biopsy ;  Surgeon: Christene Lye, MD;  Location: [facility] ORS;  Service:               General;  Laterality: Right;   MASTECTOMY MODIFIED  RADICAL                     Right 12/05/2014      Comment:Procedure: MASTECTOMY MODIFIED RADICAL;                Surgeon: Christene Lye, MD;  Location:               [facility] ORS;  Service: General;  Laterality:               Right;   BREAST BIOPSY                                   Right 10/10/2014       Comment:Right breast invasive carcinoma. T1A, N0.     Reproductive/Obstetrics negative OB ROS                            Anesthesia Physical Anesthesia Plan  ASA: III  Anesthesia Plan: General LMA   Post-op Pain Management:    Induction:   Airway Management Planned:   Additional Equipment:   Intra-op Plan:   Post-operative Plan:   Informed Consent: I have reviewed the patients History and Physical, chart, labs and discussed the procedure including the risks, benefits and alternatives for the proposed anesthesia with the patient or authorized representative who has indicated his/her understanding and acceptance.   Dental Advisory  Given  Plan Discussed with: Anesthesiologist, CRNA and Surgeon  Anesthesia Plan Comments:        Anesthesia Quick Evaluation

## 2015-03-18 NOTE — H&P (View-Only) (Signed)
PREOPERATIVE HISTORY AND PHYSICAL  Date of surgery: 03/18/2015 Chief complaint: 1.  Menorrhagia. 2.  Breast cancer on tamoxifen therapy. 3.  Cervical stenosis  Subjective:    Patient is a 44 y.o. G5P2045fmale scheduled for Hysteroscopy/D&C. Indications for procedure are Menorrhagia, with inability to perform endometrial biopsy in office; on tamoxifen therapy for breast cancer, and cervical stenosis.  Gynecologic history: Menarche age 44 Cycles regular until age 44 When they became extremely heavy and occurring twice a month. Cycles prior to age [age]were monthly, but very heavy. Patient reports having had 25 transfusions while living in [place] Workup included colonoscopy which was negative. Birth control pills did not control her menorrhagia. Most recent hemoglobin this week is reportedly at 8 g. Patient has never had a D&C for her menorrhagia. There has been no recent endometrial sampling. Attempted endometrial biopsy on 01/17/2015, was unsuccessful due to cervical stenosis    Menstrual History: OB History    Gravida Para Term Preterm AB TAB SAB Ectopic Multiple Living   5 2 2  3 1 2   2       Menarche age:[age] Patient's last menstrual period was 03/11/2015 (exact date).    Past Medical History  Diagnosis Date  . Kidney stones   . Anemia   . Ovarian cyst   . Blood transfusion without reported diagnosis   . Hypertension   . Stroke ([facility]     Sept. 2014  . Anxiety   . Headache   . MRSA infection 2009  . Dysrhythmia     TACHYCARDIA  . Enlarged heart   . BRCA negative 10-16-14  . PONV (postoperative nausea and vomiting)     DRY HEAVING AFTER BREAST LUMPECTOMY 10-2014  . Cancer of right breast [place] 10/16/2014    Upper and inner quadrant right breast, Invasive ductal carcinoma, Atypical Ductal Hyperplasia, ER/PR Pos, Her 2 Neg    Past Surgical History  Procedure Laterality Date  . Cholecystectomy    . Tonsillectomy    . Hernia repair    . Tubal ligation     . Lithotripsy    . Breast lumpectomy with sentinel lymph node biopsy Right 10/23/2014    Procedure: Right breast lumpectomy with sentinel node biopsy ;  Surgeon: SChristene Lye MD;  Location: [facility] ORS;  Service: General;  Laterality: Right;  . Mastectomy modified radical Right 12/05/2014    Procedure: MASTECTOMY MODIFIED RADICAL;  Surgeon: SChristene Lye MD;  Location: [facility] ORS;  Service: General;  Laterality: Right;  . Breast biopsy Right 10/10/2014    Right breast invasive carcinoma. T1A, N0.    OB History  Gravida Para Term Preterm AB SAB TAB Ectopic Multiple Living  5 2 2  3 2 1   2     # Outcome Date GA Lbr Len/2nd Weight Sex Delivery Anes PTL Lv  5 Term 1997   7 lb 9.6 oz (3.447 kg) M Vag-Spont   Y  4 TAB 1992          3 SAB 1992          2 Term 1991   7 lb 4.8 oz (3.311 kg) M Vag-Spont   Y  1 SAB               Social History   Social History  . Marital Status: Divorced    Spouse Name: N/A  . Number of Children: N/A  . Years of Education: N/A   Occupational History  . not working  Social History Main Topics  . Smoking status: Former Smoker    Types: Cigarettes  . Smokeless tobacco: None  . Alcohol Use: No     Comment: SOBER FOR 3 [id number])  . Drug Use: No  . Sexual Activity: No   Other Topics Concern  . None   Social History Narrative    Family History  Problem Relation Age of Onset  . Breast cancer Cousin 47  . Heart disease Maternal Grandmother   . Diabetes Paternal Grandmother   . Heart disease Paternal Grandmother   . Ovarian cancer Neg Hx   . Colon cancer Neg Hx      (Not in a hospital admission)  Allergies  Allergen Reactions  . Hydrocodone-Acetaminophen Hives and Itching  . Reglan [Metoclopramide] Other (See Comments)    "freaks me out, makes me nervous"  . Clindamycin/Lincomycin Rash  . Morphine And Related Rash    Review of Systems Constitutional: No recent fever/chills/sweats Respiratory: No recent  cough/bronchitis Cardiovascular: No chest pain Gastrointestinal: No recent nausea/vomiting/diarrhea Genitourinary: No UTI symptoms Hematologic/lymphatic:No history of coagulopathy or recent blood thinner use    Objective:    BP 155/85 mmHg  Pulse 102  Ht 5' 7"  (1.702 m)  Wt 213 lb 1.6 oz (96.662 kg)  BMI 33.37 kg/m2  LMP 03/11/2015 (Exact Date)  General:   Normal  Skin:   normal  HEENT:  Normal  Neck:  Supple without Adenopathy or Thyromegaly  Lungs:   Heart:              Breasts:   Abdomen:  Pelvis:  M/S   Extremeties:  Neuro:    clear to auscultation bilaterally   Normal without murmur   Not Examined   soft, non-tender; bowel sounds normal; no masses,  no organomegaly   Exam deferred to OR  No CVAT  Warm/Dry   Normal          Assessment:  1.  Menorrhagia. 2.  Cervical stenosis. 3.  History of breast cancer, on tamoxifen. 4.  Ultrasound endometrial stripe measuring 10.3 mm   Plan:  1.  Hysteroscopy/D&C  Counseling: The patient is to undergo hysteroscopy/D&C for menorrhagia.  She is understanding of the planned procedure and is aware of and is accepting of all surgical risks which include but are not limited to bleeding, infection, pelvic organ injury with need for repair, uterine perforation, anesthesia risks, etc.  All questions have been answered.  Informed consent is given.  Patient is ready and willing to proceed with surgery as scheduled.  Brayton Mars, MD  Note: This dictation was prepared with Dragon dictation along with smaller phrase technology. Any transcriptional errors that result from this process are unintentional.

## 2015-03-18 NOTE — Transfer of Care (Signed)
Immediate Anesthesia Transfer of Care Note  Patient: Elizabeth Flynn  Procedure(s) Performed: Procedure(s): DILATATION & CURETTAGE/HYSTEROSCOPY WITH RESECTOCOPE (N/A)  Patient Location: PACU  Anesthesia Type:General  Level of Consciousness: awake  Airway & Oxygen Therapy: Patient Spontanous Breathing and Patient connected to face mask oxygen  Post-op Assessment: Report given to RN and Post -op Vital signs reviewed and stable  Post vital signs: Reviewed and stable  Last Vitals:  Filed Vitals:   03/18/15 0909 03/18/15 1123  BP: 163/98 144/89  Pulse: 79 73  Temp: 37.3 C 37.9 C  Resp: 16 17    Complications: No apparent anesthesia complications

## 2015-03-18 NOTE — Anesthesia Procedure Notes (Signed)
Procedure Name: LMA Insertion Date/Time: 03/18/2015 10:45 AM Performed by: Allean Found Pre-anesthesia Checklist: Patient identified, Emergency Drugs available, Suction available, Patient being monitored and Timeout performed Patient Re-evaluated:Patient Re-evaluated prior to inductionOxygen Delivery Method: Circle system utilized Preoxygenation: Pre-oxygenation with 100% oxygen Intubation Type: IV induction Ventilation: Mask ventilation without difficulty LMA: LMA inserted LMA Size: 4.0 Number of attempts: 1 Placement Confirmation: positive ETCO2 and breath sounds checked- equal and bilateral Tube secured with: Tape Dental Injury: Teeth and Oropharynx as per pre-operative assessment

## 2015-03-18 NOTE — Interval H&P Note (Signed)
History and Physical Interval Note:  03/18/2015 9:25 AM  Elizabeth Flynn  has presented today for surgery, with the diagnosis of MENORRHAGIA  The various methods of treatment have been discussed with the patient and family. After consideration of risks, benefits and other options for treatment, the patient has consented to  Hysteroscopy/D&C as a surgical intervention .  The patient's history has been reviewed, patient examined, no change in status, stable for surgery.  I have reviewed the patient's chart and labs.  Questions were answered to the patient's satisfaction.     Hassell Done A Defrancesco

## 2015-03-19 ENCOUNTER — Encounter: Payer: Self-pay | Admitting: General Surgery

## 2015-03-19 ENCOUNTER — Ambulatory Visit (INDEPENDENT_AMBULATORY_CARE_PROVIDER_SITE_OTHER): Payer: Medicaid Other | Admitting: General Surgery

## 2015-03-19 VITALS — BP 166/86 | HR 78 | Resp 12 | Ht 67.0 in | Wt 214.0 lb

## 2015-03-19 DIAGNOSIS — C50211 Malignant neoplasm of upper-inner quadrant of right female breast: Secondary | ICD-10-CM

## 2015-03-19 LAB — RPR: RPR: NONREACTIVE

## 2015-03-19 NOTE — Patient Instructions (Signed)
atient to return in July 2017 left diagnotic mammogram.

## 2015-03-19 NOTE — Progress Notes (Addendum)
This is a 44 year old female here today for her follow up Here today for her follow up right mastectomy done on 12/05/14. Patient states she is doing well. She had a D & C 03/18/15. Path showed some polyps-no atypia. I have reviewed the history of present illness with the patient.  Right mastectomy site is clean and well healed. Mastectomy site shows no evidence of seroma and no signs of infection.  Stage 1 CA, now on Tamoxifen, doing well. Patient to return in July 2017 left diagnotic mammogram.

## 2015-03-20 LAB — SURGICAL PATHOLOGY

## 2015-03-27 ENCOUNTER — Ambulatory Visit (INDEPENDENT_AMBULATORY_CARE_PROVIDER_SITE_OTHER): Payer: Medicaid Other | Admitting: Obstetrics and Gynecology

## 2015-03-27 ENCOUNTER — Encounter: Payer: Self-pay | Admitting: Obstetrics and Gynecology

## 2015-03-27 VITALS — BP 142/85 | HR 73 | Ht 67.0 in | Wt 202.2 lb

## 2015-03-27 DIAGNOSIS — C50911 Malignant neoplasm of unspecified site of right female breast: Secondary | ICD-10-CM

## 2015-03-27 DIAGNOSIS — Z9889 Other specified postprocedural states: Secondary | ICD-10-CM

## 2015-03-27 DIAGNOSIS — Z09 Encounter for follow-up examination after completed treatment for conditions other than malignant neoplasm: Secondary | ICD-10-CM

## 2015-03-27 NOTE — Progress Notes (Signed)
Chief complaint: 1.  Postop check. 2.  History of breast cancer. 3.  Status post hysteroscopy/D&C.  Patient presents for postop check following hysteroscopy/D&C for atypical vaginal discharge while on tamoxifen therapy.  Pathology: Secretory type endometrium with endometrial polyps without evidence of hyperplasia or carcinoma.  Patient has done well post surgery without vaginal bleeding, vaginal discharge. She is experiencing occasional twinges of lower pelvic and rectal pain which are sporadic.  She does understand that her pelvic exam was normal as was her hysteroscopy/D&C and that it is not likely due to a gynecologic issue.  She also understands that if she needs a hysterectomy in the future, she would be a candidate for transvaginal hysterectomy.  ASSESSMENT: 1.  Status post hysteroscopy/D&C for atypical vaginal discharge while on tamoxifen therapy for breast cancer. 2.  Pathology-benign.  PLAN: 1.  Resume all activities without restriction. 2.  Monitor for any abnormal bleeding or vaginal discharge and return as needed if symptoms develop. 3.  Follow-up with Dr. Oliva Bustard regarding breast cancer monitoring and tamoxifen therapy  Brayton Mars, MD  Note: This dictation was prepared with Dragon dictation along with smaller phrase technology. Any transcriptional errors that result from this process are unintentional.

## 2015-03-27 NOTE — Patient Instructions (Signed)
1.Findings from hysteroscopy/D&C are benign.  There was no evidence of endometrial precancer or endometrial cancer. 2.  Return as needed if he develops atypical vaginal discharge or bleeding.  So we may reassess the uterine lining. 3.  Follow-up with Dr. Oliva Bustard for further discussion regarding ongoing tamoxifen therapy.

## 2015-03-28 ENCOUNTER — Encounter: Payer: Self-pay | Admitting: Oncology

## 2015-03-28 ENCOUNTER — Inpatient Hospital Stay: Payer: Medicaid Other | Attending: Oncology

## 2015-03-28 ENCOUNTER — Inpatient Hospital Stay (HOSPITAL_BASED_OUTPATIENT_CLINIC_OR_DEPARTMENT_OTHER): Payer: Medicaid Other | Admitting: Oncology

## 2015-03-28 VITALS — BP 141/93 | HR 78 | Temp 98.6°F | Wt 208.8 lb

## 2015-03-28 DIAGNOSIS — C50211 Malignant neoplasm of upper-inner quadrant of right female breast: Secondary | ICD-10-CM | POA: Insufficient documentation

## 2015-03-28 DIAGNOSIS — Z8673 Personal history of transient ischemic attack (TIA), and cerebral infarction without residual deficits: Secondary | ICD-10-CM | POA: Diagnosis not present

## 2015-03-28 DIAGNOSIS — Z79899 Other long term (current) drug therapy: Secondary | ICD-10-CM | POA: Insufficient documentation

## 2015-03-28 DIAGNOSIS — C50911 Malignant neoplasm of unspecified site of right female breast: Secondary | ICD-10-CM | POA: Diagnosis not present

## 2015-03-28 DIAGNOSIS — Z9011 Acquired absence of right breast and nipple: Secondary | ICD-10-CM | POA: Insufficient documentation

## 2015-03-28 DIAGNOSIS — F419 Anxiety disorder, unspecified: Secondary | ICD-10-CM | POA: Diagnosis not present

## 2015-03-28 DIAGNOSIS — I10 Essential (primary) hypertension: Secondary | ICD-10-CM | POA: Insufficient documentation

## 2015-03-28 DIAGNOSIS — Z79810 Long term (current) use of selective estrogen receptor modulators (SERMs): Secondary | ICD-10-CM

## 2015-03-28 DIAGNOSIS — Z87891 Personal history of nicotine dependence: Secondary | ICD-10-CM | POA: Insufficient documentation

## 2015-03-28 DIAGNOSIS — Z170 Estrogen receptor positive status [ER+]: Secondary | ICD-10-CM | POA: Insufficient documentation

## 2015-03-28 DIAGNOSIS — N83209 Unspecified ovarian cyst, unspecified side: Secondary | ICD-10-CM | POA: Insufficient documentation

## 2015-03-28 DIAGNOSIS — Z171 Estrogen receptor negative status [ER-]: Secondary | ICD-10-CM

## 2015-03-28 DIAGNOSIS — Z923 Personal history of irradiation: Secondary | ICD-10-CM

## 2015-03-28 LAB — COMPREHENSIVE METABOLIC PANEL
ALBUMIN: 4 g/dL (ref 3.5–5.0)
ALT: 17 U/L (ref 14–54)
AST: 20 U/L (ref 15–41)
Alkaline Phosphatase: 46 U/L (ref 38–126)
Anion gap: 6 (ref 5–15)
BUN: 12 mg/dL (ref 6–20)
CHLORIDE: 105 mmol/L (ref 101–111)
CO2: 22 mmol/L (ref 22–32)
Calcium: 8.5 mg/dL — ABNORMAL LOW (ref 8.9–10.3)
Creatinine, Ser: 0.71 mg/dL (ref 0.44–1.00)
GFR calc Af Amer: >60 mL/min (ref >60)
GFR calc non Af Amer: >60 mL/min (ref >60)
GLUCOSE: 103 mg/dL — AB (ref 65–99)
POTASSIUM: 3.9 mmol/L (ref 3.5–5.1)
SODIUM: 133 mmol/L — AB (ref 135–145)
Total Bilirubin: 0.6 mg/dL (ref 0.3–1.2)
Total Protein: 7.3 g/dL (ref 6.5–8.1)

## 2015-03-28 LAB — CBC WITH DIFFERENTIAL/PLATELET
BASOS ABS: 0.1 10*3/uL (ref 0–0.1)
Basophils Relative: 1 %
EOS PCT: 1 %
Eosinophils Absolute: 0.1 10*3/uL (ref 0–0.7)
HCT: 30.9 % — ABNORMAL LOW (ref 35.0–47.0)
Hemoglobin: 9.5 g/dL — ABNORMAL LOW (ref 12.0–16.0)
LYMPHS PCT: 24 %
Lymphs Abs: 2.1 10*3/uL (ref 1.0–3.6)
MCH: 22.3 pg — ABNORMAL LOW (ref 26.0–34.0)
MCHC: 30.8 g/dL — ABNORMAL LOW (ref 32.0–36.0)
MCV: 72.5 fL — AB (ref 80.0–100.0)
Monocytes Absolute: 0.6 10*3/uL (ref 0.2–0.9)
Monocytes Relative: 7 %
NEUTROS ABS: 5.8 10*3/uL (ref 1.4–6.5)
NEUTROS PCT: 67 %
PLATELETS: 367 10*3/uL (ref 150–440)
RBC: 4.26 MIL/uL (ref 3.80–5.20)
RDW: 20.9 % — AB (ref 11.5–14.5)
WBC: 8.7 10*3/uL (ref 3.6–11.0)

## 2015-03-28 MED ORDER — TAMOXIFEN CITRATE 20 MG PO TABS: ORAL_TABLET | ORAL | Status: DC

## 2015-03-28 NOTE — Progress Notes (Signed)
Patient states she is having a hard time with Tamoxifen.  States she is not eating well. Her stomach cramps.  She has generalized aches and pains stating she is having bony pain in her legs.  She also is not sleeping well.

## 2015-04-01 ENCOUNTER — Encounter: Payer: Self-pay | Admitting: Oncology

## 2015-04-01 NOTE — Progress Notes (Signed)
[place] @ [facility] [phone]  Fax:([phone] [place]  Sharronda Schweers OB: 24-Jul-1970  MR#: [id number]  WPY#:[id number]  Patient Care Team: Lavonne Chick, MD as PCP - General (Family Medicine) Rico Junker, RN as Registered Nurse (Radiology) Theodore Demark, RN as Registered Nurse Seeplaputhur Robinette Haines, MD (General Surgery)   VISIT DIAGNOSIS:   Carcinoma of right breast Oncology History   1.  Abnormal mammogram with calcification in the right breast.  Biopsy on October 10, 2014 was positive for invasive carcinoma 2.  Status post lumpectomy and axillary lymph node evaluation T1B N0 M0 tumorESTROGEN RECEPTOR POSITIVE.  pROGESTERONE RECEPTOR POSITIVE.  Her-2/NEU RECEPTOR NEGATIVE BY FISH 3.  My risk genetic study (July of 2016) negative.  No clinically significant mutation identified   4.patient did not want to get radiation therapy and wanted to get mastectomy done.  Underwent right breast mastectomy  In August of 2016 No microscopy Evidence of malignancy was found     Oncology Flowsheet 01/31/2015 02/01/2015 02/01/2015 02/01/2015 02/02/2015 02/02/2015 03/18/2015  dexamethasone (DECADRON) IJ - - - - - - -  dexamethasone (DECADRON) IV - - - - - - -  ondansetron (ZOFRAN) IV - - - - - - -  promethazine (PHENERGAN) IV 12.5 mg 12.5 mg 12.5 mg 12.5 mg 12.5 mg 12.5 mg -  promethazine (PHENERGAN) PO - - - - - - -  tamoxifen (NOLVADEX) PO   20 mg     20 mg   -    INTERVAL HISTORY:  44 year old lady presenteded to [facility] clinic with history of pelvic pain.  Routine mammogram because patient never had mammogram was ordered and was abnormal in the right breast.  Stereotactic biopsy was positive for invasive cancer patient was referred to me for discussion regarding various options of therapy Extremely apprehensive patient accompanied by friend. Patient's mother who is a Marine scientist lives in [place].  Patient is here for ongoing evaluation and treatment  consideration.  Worried about tenderness in the other breast. Taking tamoxifen getting regular GYN checkup done. Recently had a D&C for irregular menstruation Patient states she is having a hard time with Tamoxifen. States she is not eating well. Her stomach cramps. She has generalized aches and pains stating she is having bony pain in her legs. She also is    REVIEW OF SYSTEMS:  GENERAL:  Feels good.  Active.  No fevers, sweats or weight loss. PERFORMANCE STATUS (ECOG): O HEENT:  No visual changes, runny nose, sore throat, mouth sores or tenderness. Lungs: No shortness of breath or cough.  No hemoptysis. Cardiac:  No chest pain, palpitations, orthopnea, or PND. GI:  No nausea, vomiting, diarrhea, constipation, melena or hematochezia. GU:  No urgency, frequency, dysuria, or hematuria. Musculoskeletal:  No back pain.  No joint pain.  No muscle tenderness. Extremities:  No pain or swelling. Skin:  No rashes or skin changes. Neuro:  No headache, numbness or weakness, balance or coordination issues. In this September off 2014 patient had bleed in the brain and exact history not available. Diagnosis in [place], [place] Endocrine:  No diabetes, thyroid issues, hot flashes or night sweats. Psych: Patient has a mood disorder and on chronic medication being managed by a nurse practitioner Pain:  No focal pain. Review of systems:  All other systems reviewed and found to be negative. As per HPI. Otherwise, a complete review of systems is negatve.  PAST MEDICAL HISTORY: Past Medical History  Diagnosis Date  . Kidney  stones   . Anemia   . Ovarian cyst   . Blood transfusion without reported diagnosis   . Hypertension   . Stroke [facility])     Sept. 2014  . Anxiety   . Headache   . MRSA infection 2009  . Dysrhythmia     TACHYCARDIA  . Enlarged heart   . BRCA negative 10-16-14  . PONV (postoperative nausea and vomiting)     DRY HEAVING AFTER BREAST LUMPECTOMY 10-2014  . Cancer of  right breast ([place]) 10/16/2014    Upper and inner quadrant right breast, Invasive ductal carcinoma, Atypical Ductal Hyperplasia, ER/PR Pos, Her 2 Neg    PAST SURGICAL HISTORY: Past Surgical History  Procedure Laterality Date  . Cholecystectomy    . Tonsillectomy    . Hernia repair    . Tubal ligation    . Lithotripsy    . Breast lumpectomy with sentinel lymph node biopsy Right 10/23/2014    Procedure: Right breast lumpectomy with sentinel node biopsy ;  Surgeon: Christene Lye, MD;  Location: [facility] ORS;  Service: General;  Laterality: Right;  . Mastectomy modified radical Right 12/05/2014    Procedure: MASTECTOMY MODIFIED RADICAL;  Surgeon: Christene Lye, MD;  Location: [facility] ORS;  Service: General;  Laterality: Right;  . Breast biopsy Right 10/10/2014    Right breast invasive carcinoma. T1A, N0.  . Dilatation & currettage/hysteroscopy with resectocope N/A 03/18/2015    Procedure: DILATATION & CURETTAGE/HYSTEROSCOPY WITH RESECTOCOPE;  Surgeon: Brayton Mars, MD;  Location: [facility] ORS;  Service: Gynecology;  Laterality: N/A;    FAMILY HISTORY Family History  Problem Relation Age of Onset  . Breast cancer Cousin 25  . Heart disease Maternal Grandmother   . Diabetes Paternal Grandmother   . Heart disease Paternal Grandmother   . Ovarian cancer Neg Hx   . Colon cancer Neg Hx    Mother had atypical cells in both breasts and has been removed before age 49 Streaky of lung cancer and stomach cancer in the family GYNECOLOGIC HISTORY: Heavy menstrual cycle every 14 days  ADVANCED DIRECTIVES:Patient does not have any living will or healthcare power of attorney.  Information was given .  Available resources had been discussed.  We will follow-up on subsequent appointments regarding this issue    HEALTH MAINTENANCE: Social History  Substance Use Topics  . Smoking status: Former Smoker    Types: Cigarettes  . Smokeless tobacco: None  . Alcohol Use: No     Comment: SOBER FOR  3 CEYEM([id number])      Allergies  Allergen Reactions  . Hydrocodone-Acetaminophen Hives and Itching  . Reglan [Metoclopramide] Other (See Comments)    "freaks me out, makes me nervous"  . Clindamycin/Lincomycin Rash  . Morphine And Related Rash    Current Outpatient Prescriptions  Medication Sig Dispense Refill  . acetaminophen (TYLENOL) 500 MG tablet Take 2 tablets (1,000 mg total) by mouth every 6 (six) hours as needed. For pain 30 tablet 0  . amLODipine (NORVASC) 10 MG tablet Take 1 tablet (10 mg total) by mouth daily. (Patient taking differently: Take 10 mg by mouth every morning. ) 30 tablet 0  . ferrous sulfate 325 (65 FE) MG tablet Take 325 mg by mouth daily.    Marland Kitchen FLUoxetine (PROZAC) 10 MG capsule Take 10 mg by mouth every morning.     Marland Kitchen ibuprofen (ADVIL,MOTRIN) 800 MG tablet Take 1 tablet (800 mg total) by mouth 3 (three) times daily. 30 tablet 1  . lamoTRIgine (LAMICTAL)  25 MG tablet Take 25 mg by mouth every morning.     Marland Kitchen lisinopril (PRINIVIL,ZESTRIL) 20 MG tablet Take 1 tablet (20 mg total) by mouth every evening. 30 tablet 0  . promethazine (PHENERGAN) 25 MG tablet TAKE 1/2-1 TABLET BY MOUTH EVERY 6 HOURS AS NEEDED FOR NAUSEA OR VOMITING  1  . tamoxifen (NOLVADEX) 20 MG tablet TAKE 1 TABLET (20 MG TOTAL) BY MOUTH DAILY. 30 tablet 6  . vitamin C (ASCORBIC ACID) 500 MG tablet Take 500 mg by mouth daily.     No current facility-administered medications for this visit.    OBJECTIVE: PHYSICAL EXAM: GENERAL:  Well developed, well nourished, sitting comfortably in the exam room in no acute distress.  Moderately obese lady very apprehensive MENTAL STATUS:  Alert and oriented to person, place and time. HEAD Normocephalic, atraumatic, face symmetric, no Cushingoid features. EYES:  Pupils equal round and reactive to light and accomodation.  No conjunctivitis or scleral icterus. ENT:  Oropharynx clear without lesion.  Tongue normal. Mucous membranes moist.  RESPIRATORY:  Clear to  auscultation without rales, wheezes or rhonchi. CARDIOVASCULAR:  Regular rate and rhythm without murmur, rub or gallop. BREAST:   right breast status post mastectomy.  Chest wall area no evidence of recurrent disease.  Left breast free of masses. ABDOMEN:  Soft, non-tender, with active bowel sounds, and no hepatosplenomegaly.  No masses. BACK:  No CVA tenderness.  No tenderness on percussion of the back or rib cage. SKIN:  No rashes, ulcers or lesions. EXTREMITIES: No edema, no skin discoloration or tenderness.  No palpable cords. LYMPH NODES: No palpable cervical, supraclavicular, axillary or inguinal adenopathy  NEUROLOGICAL: Unremarkable. PSYCH:  Appropriate.  Filed Vitals:   03/28/15 1057  BP: 141/93  Pulse: 78  Temp: 98.6 F (37 C)     Body mass index is 32.69 kg/(m^2).    ECOG FS:0 - Asymptomatic  LAB RESULTS:  Appointment on 03/28/2015  Component Date Value Ref Range Status  . WBC 03/28/2015 8.7  3.6 - 11.0 K/uL Final  . RBC 03/28/2015 4.26  3.80 - 5.20 MIL/uL Final  . Hemoglobin 03/28/2015 9.5* 12.0 - 16.0 g/dL Final  . HCT 03/28/2015 30.9* 35.0 - 47.0 % Final  . MCV 03/28/2015 72.5* 80.0 - 100.0 fL Final  . MCH 03/28/2015 22.3* 26.0 - 34.0 pg Final  . MCHC 03/28/2015 30.8* 32.0 - 36.0 g/dL Final  . RDW 03/28/2015 20.9* 11.5 - 14.5 % Final  . Platelets 03/28/2015 367  150 - 440 K/uL Final  . Neutrophils Relative % 03/28/2015 67   Final  . Neutro Abs 03/28/2015 5.8  1.4 - 6.5 K/uL Final  . Lymphocytes Relative 03/28/2015 24   Final  . Lymphs Abs 03/28/2015 2.1  1.0 - 3.6 K/uL Final  . Monocytes Relative 03/28/2015 7   Final  . Monocytes Absolute 03/28/2015 0.6  0.2 - 0.9 K/uL Final  . Eosinophils Relative 03/28/2015 1   Final  . Eosinophils Absolute 03/28/2015 0.1  0 - 0.7 K/uL Final  . Basophils Relative 03/28/2015 1   Final  . Basophils Absolute 03/28/2015 0.1  0 - 0.1 K/uL Final  . Sodium 03/28/2015 133* 135 - 145 mmol/L Final  . Potassium 03/28/2015 3.9  3.5 -  5.1 mmol/L Final  . Chloride 03/28/2015 105  101 - 111 mmol/L Final  . CO2 03/28/2015 22  22 - 32 mmol/L Final  . Glucose, Bld 03/28/2015 103* 65 - 99 mg/dL Final  . BUN 03/28/2015 12  6 -  20 mg/dL Final  . Creatinine, Ser 03/28/2015 0.71  0.44 - 1.00 mg/dL Final  . Calcium 03/28/2015 8.5* 8.9 - 10.3 mg/dL Final  . Total Protein 03/28/2015 7.3  6.5 - 8.1 g/dL Final  . Albumin 03/28/2015 4.0  3.5 - 5.0 g/dL Final  . AST 03/28/2015 20  15 - 41 U/L Final  . ALT 03/28/2015 17  14 - 54 U/L Final  . Alkaline Phosphatase 03/28/2015 46  38 - 126 U/L Final  . Total Bilirubin 03/28/2015 0.6  0.3 - 1.2 mg/dL Final  . GFR calc non Af Amer 03/28/2015 >60  >60 mL/min Final  . GFR calc Af Amer 03/28/2015 >60  >60 mL/min Final   Comment: (NOTE) The eGFR has been calculated using the CKD EPI equation. This calculation has not been validated in all clinical situations. eGFR's persistently <60 mL/min signify possible Chronic Kidney Disease.   . Anion gap 03/28/2015 6  5 - 15 Final     STUDIES: Mammogram to be arranged by Dr. Jamal Collin  ASSESSMENT:  Carcinoma of breast based on ultrasound and mammogram appears to be early stage tumor Invasive cancer Estrogen progesterone and HER-2 receptor negative by fish  Didnot want radiation therapy and underwent right breast mastectomy  PLAN:   Patient underwent right breast mastectomy  None of the symptoms that patient described can be due to tamoxifen however patient was advised to stop tamoxifen and see whether any of the symptoms get better.  He will give me a call. Patient and worried about not taking any anti-hormonal therapy but she was reassured Regular GYN checkup as been recommended  Patient expressed understanding and was in agreement with this plan. She also understands that She can call clinic at any time with any questions, concerns, or complaints.    No matching staging information was found for the patient.  Forest Gleason, MD   04/01/2015  7:39 AM

## 2015-04-11 ENCOUNTER — Encounter: Payer: Self-pay | Admitting: Obstetrics and Gynecology

## 2015-04-11 ENCOUNTER — Ambulatory Visit (INDEPENDENT_AMBULATORY_CARE_PROVIDER_SITE_OTHER): Payer: Medicaid Other | Admitting: Obstetrics and Gynecology

## 2015-04-11 VITALS — BP 162/85 | HR 81 | Ht 67.0 in | Wt 215.0 lb

## 2015-04-11 DIAGNOSIS — N939 Abnormal uterine and vaginal bleeding, unspecified: Secondary | ICD-10-CM

## 2015-04-11 DIAGNOSIS — Z9889 Other specified postprocedural states: Secondary | ICD-10-CM | POA: Diagnosis not present

## 2015-04-11 DIAGNOSIS — C50911 Malignant neoplasm of unspecified site of right female breast: Secondary | ICD-10-CM | POA: Diagnosis not present

## 2015-04-11 NOTE — Patient Instructions (Signed)
1.  Schedule TVH BSO for the end of January 2017. 2.  Return for preoperative appointment prior to surgery.

## 2015-04-11 NOTE — Progress Notes (Signed)
Chief complaint: 1.  Abnormal uterine bleeding. 2.  History of breast cancer, on tamoxifen therapy. 3.  Status post hysteroscopy/D&C with benign pathology  Elizabeth Flynn presents today for conference to discuss hysterectomy.  Since her hysteroscopy/D&C on 03/18/2015, she has had chronic spotting. Pathology from surgery demonstrated secretory endometrium with polyps without evidence of hyperplasia or carcinoma. Patient maintains some significant anxiety regarding the abnormal uterine bleeding.  Her oncologist suggests that she would be benefited by having hysterectomy in light of her ongoing course of tamoxifen therapy for her breast cancer. We discussed the pros and cons of surgical intervention. My recommendation is for the patient to proceed with a transvaginal hysterectomy, bilateral salpingo-oophorectomy. This surgery, through inducing surgical menopause with BSO, would also reduce her risk for subsequent ovarian cancer. After answering multiple questions, we have decided to proceed with surgical intervention.  ASSESSMENT: 1.  Abnormal uterine bleeding. 2.  History of breast cancer, on tamoxifen therapy. 3.  Status post hysteroscopy/D&C with benign pathology 4.  Good candidate for vaginal hysterectomy  PLAN: 1.  TVH BSO to be scheduled for the end of January. 2.  Return fo preop appointment.  A total of 25 minutes were spent face-to-face with the patient during this encounter and over half of that time involved counseling and coordination of care.  Brayton Mars, MD  Note: This dictation was prepared with Dragon dictation along with smaller phrase technology. Any transcriptional errors that result from this process are unintentional.

## 2015-04-15 ENCOUNTER — Ambulatory Visit: Payer: Medicaid Other | Admitting: Oncology

## 2015-04-15 ENCOUNTER — Other Ambulatory Visit: Payer: Medicaid Other

## 2015-04-25 ENCOUNTER — Encounter: Payer: Self-pay | Admitting: Obstetrics and Gynecology

## 2015-04-25 ENCOUNTER — Ambulatory Visit (INDEPENDENT_AMBULATORY_CARE_PROVIDER_SITE_OTHER): Payer: Self-pay | Admitting: Obstetrics and Gynecology

## 2015-04-25 VITALS — BP 150/84 | HR 64 | Ht 67.0 in | Wt 207.5 lb

## 2015-04-25 DIAGNOSIS — Z9889 Other specified postprocedural states: Secondary | ICD-10-CM

## 2015-04-25 DIAGNOSIS — C50911 Malignant neoplasm of unspecified site of right female breast: Secondary | ICD-10-CM

## 2015-04-25 DIAGNOSIS — N921 Excessive and frequent menstruation with irregular cycle: Secondary | ICD-10-CM

## 2015-04-25 DIAGNOSIS — Z01818 Encounter for other preprocedural examination: Secondary | ICD-10-CM

## 2015-04-25 NOTE — H&P (Signed)
Subjective:    Patient is a 45 y.o. G5P2061fmale scheduled for TVH BSO. Indications for procedure are Abnormal uterine bleeding; breast cancer on tamoxifen therapy. Recent Hysteroscopy/D&C demonstrated secretory Endometrium and endometrial polyps without evidence of hyperplasia or carcinoma.   Pertinent Gynecological History: Menses: irregular occurring approximately every uncertain days without intermenstrual spotting Bleeding: On tamoxifen therapy  Discussed Blood/Blood Products: yes   Menstrual History: OB History    Gravida Para Term Preterm AB TAB SAB Ectopic Multiple Living   5 2 2  3 1 2   2       Menarche age: NA  No LMP recorded (lmp unknown).    Past Medical History  Diagnosis Date  . Kidney stones   . Anemia   . Ovarian cyst   . Blood transfusion without reported diagnosis   . Hypertension   . Stroke ([facility]     Sept. 2014  . Anxiety   . Headache   . MRSA infection 2009  . Dysrhythmia     TACHYCARDIA  . Enlarged heart   . BRCA negative 10-16-14  . PONV (postoperative nausea and vomiting)     DRY HEAVING AFTER BREAST LUMPECTOMY 10-2014  . [place] 10/16[place]    Upper and inner quadrant right breast, Invasive ductal carcinoma, Atypical Ductal Hyperplasia, ER/PR Pos, Her 2 Neg    Past Surgical History  Procedure Laterality Date  . Cholecystectomy    . Tonsillectomy    . Hernia repair    . Tubal ligation    . Lithotripsy    . Breast lumpectomy with sentinel lymph node biopsy Right 10/23/2014    Procedure: Right breast lumpectomy with sentinel node biopsy ;  Surgeon: SChristene Lye MD;  Location: [facility] ORS;  Service: General;  Laterality: Right;  . Mastectomy modified radical Right 12/05/2014    Procedure: MASTECTOMY MODIFIED RADICAL;  Surgeon: SChristene Lye MD;  Location: [facility] ORS;  Service: General;  Laterality: Right;  . Breast biopsy Right 10/10/2014    Right breast invasive carcinoma. T1A, N0.  . Dilatation &  currettage/hysteroscopy with resectocope N/A 03/18/2015    Procedure: DILATATION & CURETTAGE/HYSTEROSCOPY WITH RESECTOCOPE;  Surgeon: MBrayton Mars MD;  Location: [facility] ORS;  Service: Gynecology;  Laterality: N/A;    OB History  Gravida Para Term Preterm AB SAB TAB Ectopic Multiple Living  5 2 2  3 2 1   2     # Outcome Date GA Lbr Len/2nd Weight Sex Delivery Anes PTL Lv  5 Term 1997   7 lb 9.6 oz (3.447 kg) M Vag-Spont   Y  4 TAB 1992          3 SAB 1992          2 Term 1991   7 lb 4.8 oz (3.311 kg) M Vag-Spont   Y  1 SAB               Social History   Social History  . Marital Status: Divorced    Spouse Name: N/A  . Number of Children: N/A  . Years of Education: N/A   Occupational History  . not working    Social History Main Topics  . Smoking status: Former Smoker    Types: Cigarettes  . Smokeless tobacco: None  . Alcohol Use: No     Comment: SOBER FOR 3 [id number]  . Drug Use: No  . Sexual Activity: No   Other Topics Concern  . None   Social History Narrative  Family History  Problem Relation Age of Onset  . Breast cancer Cousin 8  . Heart disease Maternal Grandmother   . Diabetes Paternal Grandmother   . Heart disease Paternal Grandmother   . Ovarian cancer Neg Hx   . Colon cancer Neg Hx      (Not in a hospital admission)  Allergies  Allergen Reactions  . Hydrocodone-Acetaminophen Hives and Itching  . Reglan [Metoclopramide] Other (See Comments)    "freaks me out, makes me nervous"  . Clindamycin/Lincomycin Rash  . Morphine And Related Rash    Review of Systems Constitutional: No recent fever/chills/sweats Respiratory: No recent cough/bronchitis Cardiovascular: No chest pain Gastrointestinal: No recent nausea/vomiting/diarrhea Genitourinary: No UTI symptoms Hematologic/lymphatic:No history of coagulopathy or recent blood thinner use    Objective:    BP 150/84 mmHg  Pulse 64  Ht 5' 7"  (1.702 m)  Wt 207 lb 8 oz (94.121 kg)   BMI 32.49 kg/m2  LMP  (LMP Unknown)  General:   Normal  Skin:   normal  HEENT:  Normal  Neck:  Supple without Adenopathy or Thyromegaly  Lungs:   Heart:              Breasts:   Abdomen:  Pelvis:  M/S   Extremeties:  Neuro:    clear to auscultation bilaterally   Normal without murmur   Not Examined   soft, non-tender; bowel sounds normal; no masses,  no organomegaly   Exam deferred to OR  No CVAT  Warm/Dry   Normal          Assessment:    1.  Abnormal uterine bleeding. 2.  History of breast cancer; on tamoxifen therapy. 3.  Status post hysteroscopy/D&C with normal pathology   Plan:   1.  TVH BSO  Counseling: The patient is to undergo TVH BSO on 04/29/2015.  She is understanding of the planned procedure and is aware of and is accepting of all surgical risks which include but are not limited to bleeding, infection, pelvic organ injury with need for repair, but that disorders, anesthesia risks, etc.  The patient will be surgically menopausal.  Because of her breast cancer diagnosis, she will not be on estrogen replacement therapy in the near future.  She is understanding of this and is accepting of all risks.  She is ready and willing to proceed with surgery as scheduled.  Brayton Mars, MD  Note: This dictation was prepared with Dragon dictation along with smaller phrase technology. Any transcriptional errors that result from this process are unintentional.

## 2015-04-25 NOTE — Progress Notes (Signed)
Subjective:  PREOPERATIVE HISTORY AND PHYSICAL    Patient is a 45 y.o. G5P2025fmale scheduled for TVH BSO. Indications for procedure are Abnormal uterine bleeding; breast cancer on tamoxifen therapy. Recent Hysteroscopy/D&C demonstrated secretory Endometrium and endometrial polyps without evidence of hyperplasia or carcinoma.   Pertinent Gynecological History: Menses: irregular occurring approximately every uncertain days without intermenstrual spotting Bleeding: On tamoxifen therapy  Discussed Blood/Blood Products: yes   Menstrual History: OB History    Gravida Para Term Preterm AB TAB SAB Ectopic Multiple Living   5 2 2  3 1 2   2       Menarche age: NA  No LMP recorded (lmp unknown).    Past Medical History  Diagnosis Date  . Kidney stones   . Anemia   . Ovarian cyst   . Blood transfusion without reported diagnosis   . Hypertension   . Stroke ([facility]     Sept. 2014  . Anxiety   . Headache   . MRSA infection 2009  . Dysrhythmia     TACHYCARDIA  . Enlarged heart   . BRCA negative 10-16-14  . PONV (postoperative nausea and vomiting)     DRY HEAVING AFTER BREAST LUMPECTOMY 10-2014  . [place] of right [place] ([place] 10/16/2014    Upper and inner quadrant right breast, Invasive ductal carcinoma, Atypical Ductal Hyperplasia, ER/PR Pos, Her 2 Neg    Past Surgical History  Procedure Laterality Date  . Cholecystectomy    . Tonsillectomy    . Hernia repair    . Tubal ligation    . Lithotripsy    . Breast lumpectomy with sentinel lymph node biopsy Right 10/23/2014    Procedure: Right breast lumpectomy with sentinel node biopsy ;  Surgeon: SChristene Lye MD;  Location: [facility] ORS;  Service: General;  Laterality: Right;  . Mastectomy modified radical Right 12/05/2014    Procedure: MASTECTOMY MODIFIED RADICAL;  Surgeon: SChristene Lye MD;  Location: [facility] ORS;  Service: General;  Laterality: Right;  . Breast biopsy Right 10/10/2014    Right breast invasive carcinoma. T1A,  N0.  . Dilatation & currettage/hysteroscopy with resectocope N/A 03/18/2015    Procedure: DILATATION & CURETTAGE/HYSTEROSCOPY WITH RESECTOCOPE;  Surgeon: MBrayton Mars MD;  Location: [facility] ORS;  Service: Gynecology;  Laterality: N/A;    OB History  Gravida Para Term Preterm AB SAB TAB Ectopic Multiple Living  5 2 2  3 2 1   2     # Outcome Date GA Lbr Len/2nd Weight Sex Delivery Anes PTL Lv  5 Term 1997   7 lb 9.6 oz (3.447 kg) M Vag-Spont   Y  4 TAB 1992          3 SAB 1992          2 Term 1991   7 lb 4.8 oz (3.311 kg) M Vag-Spont   Y  1 SAB               Social History   Social History  . Marital Status: Divorced    Spouse Name: N/A  . Number of Children: N/A  . Years of Education: N/A   Occupational History  . not working    Social History Main Topics  . Smoking status: Former Smoker    Types: Cigarettes  . Smokeless tobacco: None  . Alcohol Use: No     Comment: SOBER FOR 3 [id number]  . Drug Use: No  . Sexual Activity: No   Other Topics Concern  . None  Social History Narrative    Family History  Problem Relation Age of Onset  . Breast cancer Cousin 37  . Heart disease Maternal Grandmother   . Diabetes Paternal Grandmother   . Heart disease Paternal Grandmother   . Ovarian cancer Neg Hx   . Colon cancer Neg Hx      (Not in a hospital admission)  Allergies  Allergen Reactions  . Hydrocodone-Acetaminophen Hives and Itching  . Reglan [Metoclopramide] Other (See Comments)    "freaks me out, makes me nervous"  . Clindamycin/Lincomycin Rash  . Morphine And Related Rash    Review of Systems Constitutional: No recent fever/chills/sweats Respiratory: No recent cough/bronchitis Cardiovascular: No chest pain Gastrointestinal: No recent nausea/vomiting/diarrhea Genitourinary: No UTI symptoms Hematologic/lymphatic:No history of coagulopathy or recent blood thinner use    Objective:    BP 150/84 mmHg  Pulse 64  Ht 5' 7"  (1.702 m)  Wt 207 lb  8 oz (94.121 kg)  BMI 32.49 kg/m2  LMP  (LMP Unknown)  General:   Normal  Skin:   normal  HEENT:  Normal  Neck:  Supple without Adenopathy or Thyromegaly  Lungs:   Heart:              Breasts:   Abdomen:  Pelvis:  M/S   Extremeties:  Neuro:    clear to auscultation bilaterally   Normal without murmur   Not Examined   soft, non-tender; bowel sounds normal; no masses,  no organomegaly   Exam deferred to OR  No CVAT  Warm/Dry   Normal          Assessment:    1.  Abnormal uterine bleeding. 2.  History of breast cancer; on tamoxifen therapy. 3.  Status post hysteroscopy/D&C with normal pathology   Plan:   1.  TVH BSO  Counseling: The patient is to undergo TVH BSO on 04/29/2015.  She is understanding of the planned procedure and is aware of and is accepting of all surgical risks which include but are not limited to bleeding, infection, pelvic organ injury with need for repair, but that disorders, anesthesia risks, etc.  The patient will be surgically menopausal.  Because of her breast cancer diagnosis, she will not be on estrogen replacement therapy in the near future.  She is understanding of this and is accepting of all risks.  She is ready and willing to proceed with surgery as scheduled.  Brayton Mars, MD  Note: This dictation was prepared with Dragon dictation along with smaller phrase technology. Any transcriptional errors that result from this process are unintentional.

## 2015-04-26 ENCOUNTER — Other Ambulatory Visit
Admission: RE | Admit: 2015-04-26 | Discharge: 2015-04-26 | Disposition: A | Discharge status: Home / Self Care | Payer: Medicaid Other | Source: Ambulatory Visit | Attending: Obstetrics and Gynecology | Admitting: Obstetrics and Gynecology

## 2015-04-26 ENCOUNTER — Encounter: Payer: Self-pay | Admitting: *Deleted

## 2015-04-26 ENCOUNTER — Inpatient Hospital Stay
Admission: RE | Admit: 2015-04-26 | Discharge: 2015-04-26 | Disposition: A | Discharge status: Home / Self Care | Payer: Medicaid Other | Source: Ambulatory Visit

## 2015-04-26 DIAGNOSIS — Z170 Estrogen receptor positive status [ER+]: Secondary | ICD-10-CM | POA: Diagnosis not present

## 2015-04-26 DIAGNOSIS — Z9889 Other specified postprocedural states: Secondary | ICD-10-CM | POA: Diagnosis not present

## 2015-04-26 DIAGNOSIS — N800 Endometriosis of uterus: Secondary | ICD-10-CM | POA: Diagnosis not present

## 2015-04-26 DIAGNOSIS — Z79810 Long term (current) use of selective estrogen receptor modulators (SERMs): Secondary | ICD-10-CM | POA: Diagnosis not present

## 2015-04-26 DIAGNOSIS — F419 Anxiety disorder, unspecified: Secondary | ICD-10-CM | POA: Diagnosis not present

## 2015-04-26 DIAGNOSIS — Z8673 Personal history of transient ischemic attack (TIA), and cerebral infarction without residual deficits: Secondary | ICD-10-CM | POA: Diagnosis not present

## 2015-04-26 DIAGNOSIS — C50911 Malignant neoplasm of unspecified site of right female breast: Secondary | ICD-10-CM | POA: Diagnosis not present

## 2015-04-26 DIAGNOSIS — Z803 Family history of malignant neoplasm of breast: Secondary | ICD-10-CM | POA: Diagnosis not present

## 2015-04-26 DIAGNOSIS — N939 Abnormal uterine and vaginal bleeding, unspecified: Secondary | ICD-10-CM | POA: Diagnosis present

## 2015-04-26 DIAGNOSIS — Z833 Family history of diabetes mellitus: Secondary | ICD-10-CM | POA: Diagnosis not present

## 2015-04-26 DIAGNOSIS — Z87891 Personal history of nicotine dependence: Secondary | ICD-10-CM | POA: Diagnosis not present

## 2015-04-26 DIAGNOSIS — Z8614 Personal history of Methicillin resistant Staphylococcus aureus infection: Secondary | ICD-10-CM | POA: Diagnosis not present

## 2015-04-26 DIAGNOSIS — D649 Anemia, unspecified: Secondary | ICD-10-CM | POA: Diagnosis not present

## 2015-04-26 DIAGNOSIS — Z9011 Acquired absence of right breast and nipple: Secondary | ICD-10-CM | POA: Diagnosis not present

## 2015-04-26 DIAGNOSIS — Z8249 Family history of ischemic heart disease and other diseases of the circulatory system: Secondary | ICD-10-CM | POA: Diagnosis not present

## 2015-04-26 DIAGNOSIS — I10 Essential (primary) hypertension: Secondary | ICD-10-CM | POA: Diagnosis not present

## 2015-04-26 DIAGNOSIS — Z01812 Encounter for preprocedural laboratory examination: Secondary | ICD-10-CM | POA: Diagnosis not present

## 2015-04-26 DIAGNOSIS — N840 Polyp of corpus uteri: Secondary | ICD-10-CM | POA: Diagnosis not present

## 2015-04-26 DIAGNOSIS — Z881 Allergy status to other antibiotic agents status: Secondary | ICD-10-CM | POA: Diagnosis not present

## 2015-04-26 DIAGNOSIS — Z885 Allergy status to narcotic agent status: Secondary | ICD-10-CM | POA: Diagnosis not present

## 2015-04-26 DIAGNOSIS — Z87442 Personal history of urinary calculi: Secondary | ICD-10-CM | POA: Diagnosis not present

## 2015-04-26 DIAGNOSIS — Z888 Allergy status to other drugs, medicaments and biological substances status: Secondary | ICD-10-CM | POA: Diagnosis not present

## 2015-04-26 DIAGNOSIS — Z9049 Acquired absence of other specified parts of digestive tract: Secondary | ICD-10-CM | POA: Diagnosis not present

## 2015-04-26 DIAGNOSIS — D251 Intramural leiomyoma of uterus: Secondary | ICD-10-CM | POA: Diagnosis not present

## 2015-04-26 LAB — CBC WITH DIFFERENTIAL/PLATELET
BASOS ABS: 0.1 10*3/uL (ref 0–0.1)
BASOS PCT: 1 %
Eosinophils Absolute: 0.1 10*3/uL (ref 0–0.7)
Eosinophils Relative: 1 %
HEMATOCRIT: 28.3 % — AB (ref 35.0–47.0)
HEMOGLOBIN: 8.7 g/dL — AB (ref 12.0–16.0)
LYMPHS PCT: 31 %
Lymphs Abs: 2.2 10*3/uL (ref 1.0–3.6)
MCH: 22.6 pg — ABNORMAL LOW (ref 26.0–34.0)
MCHC: 30.8 g/dL — ABNORMAL LOW (ref 32.0–36.0)
MCV: 73.4 fL — AB (ref 80.0–100.0)
MONOS PCT: 8 %
Monocytes Absolute: 0.5 10*3/uL (ref 0.2–0.9)
NEUTROS ABS: 4.2 10*3/uL (ref 1.4–6.5)
NEUTROS PCT: 59 %
Platelets: 308 10*3/uL (ref 150–440)
RBC: 3.86 MIL/uL (ref 3.80–5.20)
RDW: 16.9 % — ABNORMAL HIGH (ref 11.5–14.5)
WBC: 7.1 10*3/uL (ref 3.6–11.0)

## 2015-04-26 LAB — TYPE AND SCREEN
ABO/RH(D): O NEG
Antibody Screen: NEGATIVE
EXTEND SAMPLE REASON: TRANSFUSED

## 2015-04-26 LAB — SURGICAL PCR SCREEN
MRSA, PCR: NEGATIVE
Staphylococcus aureus: NEGATIVE

## 2015-04-26 NOTE — Patient Instructions (Addendum)
  Your procedure is scheduled on: 04/29/15 Mon @10 :15am  Report to Day Surgery.2nd floor medical mall   Remember: Instructions that are not followed completely may result in serious medical risk, up to and including death, or upon the discretion of your surgeon and anesthesiologist your surgery may need to be rescheduled.    _x___ 1. Do not eat food or drink liquids after midnight. No gum chewing or hard candies.     ____ 2. No Alcohol for 24 hours before or after surgery.   ____ 3. Bring all medications with you on the day of surgery if instructed.    _x___ 4. Notify your doctor if there is any change in your medical condition     (cold, fever, infections).     Do not wear jewelry, make-up, hairpins, clips or nail polish.  Do not wear lotions, powders, or perfumes. You may wear deodorant.  Do not shave 48 hours prior to surgery. Men may shave face and neck.  Do not bring valuables to the hospital.    [facility] is not responsible for any belongings or valuables.               Contacts, dentures or bridgework may not be worn into surgery.  Leave your suitcase in the car. After surgery it may be brought to your room.  For patients admitted to the hospital, discharge time is determined by your                treatment team.   Patients discharged the day of surgery will not be allowed to drive home.   Please read over the following fact sheets that you were given:   MRSA Information   _x___ Take these medicines the morning of surgery with A SIP OF WATER:    1. amLODipine (NORVASC) 10 MG tablet  2. FLUoxetine (PROZAC) 10 MG capsule  3. lisinopril (PRINIVIL,ZESTRIL) 20 MG tablet  4.  5.  6.  ____ Fleet Enema (as directed)   _x___ Use CHG Soap as directed  ____ Use inhalers on the day of surgery  ____ Stop metformin 2 days prior to surgery    ____ Take 1/2 of usual insulin dose the night before surgery and none on the morning of surgery.   ____ Stop Coumadin/Plavix/aspirin  on   _x___ Stop Anti-inflammatories on stop ibuprofen today   ____ Stop supplements until after surgery.    ____ Bring C-Pap to the hospital.

## 2015-04-29 ENCOUNTER — Encounter: Payer: Self-pay | Admitting: *Deleted

## 2015-04-29 ENCOUNTER — Other Ambulatory Visit: Payer: Medicaid Other

## 2015-04-29 ENCOUNTER — Ambulatory Visit: Payer: Medicaid Other | Admitting: Anesthesiology

## 2015-04-29 ENCOUNTER — Observation Stay
Admission: RE | Admit: 2015-04-29 | Discharge: 2015-05-01 | Disposition: A | Discharge status: Home / Self Care | Payer: Medicaid Other | Source: Ambulatory Visit | Attending: Obstetrics and Gynecology | Admitting: Obstetrics and Gynecology

## 2015-04-29 ENCOUNTER — Encounter
Admission: RE | Disposition: A | Discharge status: Home / Self Care | Payer: Self-pay | Source: Ambulatory Visit | Attending: Obstetrics and Gynecology

## 2015-04-29 DIAGNOSIS — Z87442 Personal history of urinary calculi: Secondary | ICD-10-CM | POA: Insufficient documentation

## 2015-04-29 DIAGNOSIS — Z9079 Acquired absence of other genital organ(s): Secondary | ICD-10-CM

## 2015-04-29 DIAGNOSIS — Z9011 Acquired absence of right breast and nipple: Secondary | ICD-10-CM | POA: Insufficient documentation

## 2015-04-29 DIAGNOSIS — N921 Excessive and frequent menstruation with irregular cycle: Secondary | ICD-10-CM

## 2015-04-29 DIAGNOSIS — Z87891 Personal history of nicotine dependence: Secondary | ICD-10-CM | POA: Insufficient documentation

## 2015-04-29 DIAGNOSIS — N840 Polyp of corpus uteri: Principal | ICD-10-CM | POA: Insufficient documentation

## 2015-04-29 DIAGNOSIS — Z79810 Long term (current) use of selective estrogen receptor modulators (SERMs): Secondary | ICD-10-CM | POA: Insufficient documentation

## 2015-04-29 DIAGNOSIS — Z881 Allergy status to other antibiotic agents status: Secondary | ICD-10-CM | POA: Insufficient documentation

## 2015-04-29 DIAGNOSIS — Z833 Family history of diabetes mellitus: Secondary | ICD-10-CM | POA: Insufficient documentation

## 2015-04-29 DIAGNOSIS — Z90710 Acquired absence of both cervix and uterus: Secondary | ICD-10-CM

## 2015-04-29 DIAGNOSIS — Z9889 Other specified postprocedural states: Secondary | ICD-10-CM | POA: Insufficient documentation

## 2015-04-29 DIAGNOSIS — Z90722 Acquired absence of ovaries, bilateral: Secondary | ICD-10-CM

## 2015-04-29 DIAGNOSIS — Z8673 Personal history of transient ischemic attack (TIA), and cerebral infarction without residual deficits: Secondary | ICD-10-CM | POA: Insufficient documentation

## 2015-04-29 DIAGNOSIS — C50911 Malignant neoplasm of unspecified site of right female breast: Secondary | ICD-10-CM | POA: Insufficient documentation

## 2015-04-29 DIAGNOSIS — Z8249 Family history of ischemic heart disease and other diseases of the circulatory system: Secondary | ICD-10-CM | POA: Insufficient documentation

## 2015-04-29 DIAGNOSIS — N939 Abnormal uterine and vaginal bleeding, unspecified: Secondary | ICD-10-CM | POA: Diagnosis present

## 2015-04-29 DIAGNOSIS — N800 Endometriosis of uterus: Secondary | ICD-10-CM | POA: Insufficient documentation

## 2015-04-29 DIAGNOSIS — Z8614 Personal history of Methicillin resistant Staphylococcus aureus infection: Secondary | ICD-10-CM | POA: Insufficient documentation

## 2015-04-29 DIAGNOSIS — Z888 Allergy status to other drugs, medicaments and biological substances status: Secondary | ICD-10-CM | POA: Insufficient documentation

## 2015-04-29 DIAGNOSIS — D251 Intramural leiomyoma of uterus: Secondary | ICD-10-CM | POA: Insufficient documentation

## 2015-04-29 DIAGNOSIS — D649 Anemia, unspecified: Secondary | ICD-10-CM | POA: Insufficient documentation

## 2015-04-29 DIAGNOSIS — Z885 Allergy status to narcotic agent status: Secondary | ICD-10-CM | POA: Insufficient documentation

## 2015-04-29 DIAGNOSIS — F419 Anxiety disorder, unspecified: Secondary | ICD-10-CM | POA: Insufficient documentation

## 2015-04-29 DIAGNOSIS — Z170 Estrogen receptor positive status [ER+]: Secondary | ICD-10-CM | POA: Insufficient documentation

## 2015-04-29 DIAGNOSIS — Z803 Family history of malignant neoplasm of breast: Secondary | ICD-10-CM | POA: Insufficient documentation

## 2015-04-29 DIAGNOSIS — I10 Essential (primary) hypertension: Secondary | ICD-10-CM | POA: Insufficient documentation

## 2015-04-29 DIAGNOSIS — Z9049 Acquired absence of other specified parts of digestive tract: Secondary | ICD-10-CM | POA: Insufficient documentation

## 2015-04-29 HISTORY — PX: VAGINAL HYSTERECTOMY: SHX2639

## 2015-04-29 HISTORY — DX: Depression, unspecified: F32.A

## 2015-04-29 HISTORY — DX: Major depressive disorder, single episode, unspecified: F32.9

## 2015-04-29 HISTORY — DX: Diarrhea, unspecified: R19.7

## 2015-04-29 HISTORY — DX: Gastro-esophageal reflux disease without esophagitis: K21.9

## 2015-04-29 HISTORY — DX: Reserved for inherently not codable concepts without codable children: IMO0001

## 2015-04-29 HISTORY — DX: Cardiomyopathy, unspecified: I42.9

## 2015-04-29 HISTORY — DX: Acute pancreatitis without necrosis or infection, unspecified: K85.90

## 2015-04-29 LAB — POCT PREGNANCY, URINE: Preg Test, Ur: NEGATIVE

## 2015-04-29 SURGERY — HYSTERECTOMY, VAGINAL
Anesthesia: General | Laterality: Bilateral

## 2015-04-29 MED ORDER — ONDANSETRON HCL 4 MG/2ML IJ SOLN
INTRAMUSCULAR | Status: DC | PRN
  Administered 2015-04-29: 4 mg via INTRAVENOUS

## 2015-04-29 MED ORDER — ESTROGENS, CONJUGATED 0.625 MG/GM VA CREA
TOPICAL_CREAM | VAGINAL | Status: AC
Start: 2015-04-29 — End: 2015-04-29
  Filled 2015-04-29: qty 30

## 2015-04-29 MED ORDER — ROCURONIUM BROMIDE 100 MG/10ML IV SOLN
INTRAVENOUS | Status: DC | PRN
  Administered 2015-04-29: 40 mg via INTRAVENOUS

## 2015-04-29 MED ORDER — LABETALOL HCL 5 MG/ML IV SOLN
INTRAVENOUS | Status: AC
  Administered 2015-04-29: 5 mg via INTRAVENOUS
  Filled 2015-04-29: qty 4

## 2015-04-29 MED ORDER — CEFAZOLIN SODIUM-DEXTROSE 2-3 GM-% IV SOLR
2.0000 g | INTRAVENOUS | Status: AC
  Administered 2015-04-29: 2 g via INTRAVENOUS

## 2015-04-29 MED ORDER — CEFAZOLIN SODIUM-DEXTROSE 2-3 GM-% IV SOLR
INTRAVENOUS | Status: AC
  Administered 2015-04-29: 2 g via INTRAVENOUS
  Filled 2015-04-29: qty 50

## 2015-04-29 MED ORDER — FENTANYL CITRATE (PF) 100 MCG/2ML IJ SOLN
INTRAMUSCULAR | Status: DC | PRN
  Administered 2015-04-29: 50 ug via INTRAVENOUS
  Administered 2015-04-29: 100 ug via INTRAVENOUS
  Administered 2015-04-29 (×2): 25 ug via INTRAVENOUS

## 2015-04-29 MED ORDER — KETOROLAC TROMETHAMINE 30 MG/ML IJ SOLN
30.0000 mg | Freq: Four times a day (QID) | INTRAMUSCULAR | Status: DC
  Administered 2015-04-29 – 2015-05-01 (×7): 30 mg via INTRAVENOUS
  Filled 2015-04-29 (×7): qty 1

## 2015-04-29 MED ORDER — FAMOTIDINE 20 MG PO TABS
20.0000 mg | ORAL_TABLET | Freq: Once | ORAL | Status: AC
  Administered 2015-04-29: 20 mg via ORAL

## 2015-04-29 MED ORDER — LACTATED RINGERS IV SOLN
INTRAVENOUS | Status: DC
  Administered 2015-04-29 – 2015-04-30 (×2): via INTRAVENOUS

## 2015-04-29 MED ORDER — LISINOPRIL 20 MG PO TABS
20.0000 mg | ORAL_TABLET | Freq: Every day | ORAL | Status: DC
  Administered 2015-04-29 – 2015-05-01 (×3): 20 mg via ORAL
  Filled 2015-04-29 (×3): qty 1

## 2015-04-29 MED ORDER — MORPHINE SULFATE (PF) 2 MG/ML IV SOLN: 1.0000 mg | INTRAVENOUS | Status: DC | PRN

## 2015-04-29 MED ORDER — PROMETHAZINE HCL 25 MG PO TABS
25.0000 mg | ORAL_TABLET | Freq: Four times a day (QID) | ORAL | Status: DC | PRN
  Administered 2015-04-29 – 2015-04-30 (×2): 25 mg via ORAL
  Filled 2015-04-29 (×2): qty 1

## 2015-04-29 MED ORDER — DOCUSATE SODIUM 100 MG PO CAPS
100.0000 mg | ORAL_CAPSULE | Freq: Two times a day (BID) | ORAL | Status: DC
  Administered 2015-04-29 – 2015-05-01 (×4): 100 mg via ORAL
  Filled 2015-04-29 (×4): qty 1

## 2015-04-29 MED ORDER — PROPOFOL 10 MG/ML IV BOLUS
INTRAVENOUS | Status: DC | PRN
  Administered 2015-04-29: 150 mg via INTRAVENOUS

## 2015-04-29 MED ORDER — ONDANSETRON HCL 4 MG/2ML IJ SOLN
4.0000 mg | Freq: Once | INTRAMUSCULAR | Status: AC | PRN
  Administered 2015-04-29: 4 mg via INTRAVENOUS

## 2015-04-29 MED ORDER — ACETAMINOPHEN 325 MG PO TABS
650.0000 mg | ORAL_TABLET | ORAL | Status: DC | PRN
  Administered 2015-05-01: 650 mg via ORAL
  Filled 2015-04-29: qty 2

## 2015-04-29 MED ORDER — FAMOTIDINE 20 MG PO TABS
ORAL_TABLET | ORAL | Status: AC
Start: 2015-04-29 — End: 2015-04-29
  Administered 2015-04-29: 20 mg via ORAL
  Filled 2015-04-29: qty 1

## 2015-04-29 MED ORDER — KETOROLAC TROMETHAMINE 30 MG/ML IJ SOLN
30.0000 mg | Freq: Four times a day (QID) | INTRAMUSCULAR | Status: DC
  Filled 2015-04-29: qty 1

## 2015-04-29 MED ORDER — LACTATED RINGERS IV SOLN
INTRAVENOUS | Status: DC
  Administered 2015-04-29: 125 mL/h via INTRAVENOUS

## 2015-04-29 MED ORDER — FENTANYL CITRATE (PF) 100 MCG/2ML IJ SOLN
INTRAMUSCULAR | Status: AC
  Administered 2015-04-29: 25 ug via INTRAVENOUS
  Filled 2015-04-29: qty 2

## 2015-04-29 MED ORDER — LABETALOL HCL 5 MG/ML IV SOLN
INTRAVENOUS | Status: AC
  Administered 2015-04-29: 10 mg via INTRAVENOUS
  Filled 2015-04-29: qty 4

## 2015-04-29 MED ORDER — LIDOCAINE HCL (CARDIAC) 20 MG/ML IV SOLN
INTRAVENOUS | Status: DC | PRN
  Administered 2015-04-29: 50 mg via INTRAVENOUS

## 2015-04-29 MED ORDER — ONDANSETRON HCL 4 MG/2ML IJ SOLN
INTRAMUSCULAR | Status: AC
  Administered 2015-04-29: 4 mg via INTRAVENOUS
  Filled 2015-04-29: qty 2

## 2015-04-29 MED ORDER — KETOROLAC TROMETHAMINE 30 MG/ML IJ SOLN
INTRAMUSCULAR | Status: AC
  Administered 2015-04-29: 30 mg via INTRAVENOUS
  Filled 2015-04-29: qty 1

## 2015-04-29 MED ORDER — MIDAZOLAM HCL 2 MG/2ML IJ SOLN
INTRAMUSCULAR | Status: DC | PRN
  Administered 2015-04-29: 2 mg via INTRAVENOUS

## 2015-04-29 MED ORDER — LABETALOL HCL 5 MG/ML IV SOLN
5.0000 mg | INTRAVENOUS | Status: DC | PRN
  Administered 2015-04-29 (×3): 5 mg via INTRAVENOUS
  Administered 2015-04-29: 10 mg via INTRAVENOUS

## 2015-04-29 MED ORDER — SIMETHICONE 80 MG PO CHEW
80.0000 mg | CHEWABLE_TABLET | Freq: Four times a day (QID) | ORAL | Status: DC | PRN
  Administered 2015-04-30 – 2015-05-01 (×2): 80 mg via ORAL
  Filled 2015-04-29 (×2): qty 1

## 2015-04-29 MED ORDER — LACTATED RINGERS IV SOLN
INTRAVENOUS | Status: DC
  Administered 2015-04-29 (×2): via INTRAVENOUS

## 2015-04-29 MED ORDER — DEXAMETHASONE SODIUM PHOSPHATE 10 MG/ML IJ SOLN
INTRAMUSCULAR | Status: DC | PRN
  Administered 2015-04-29: 5 mg via INTRAVENOUS

## 2015-04-29 MED ORDER — HYDROMORPHONE HCL 1 MG/ML IJ SOLN
0.5000 mg | INTRAMUSCULAR | Status: DC | PRN
  Administered 2015-04-29 – 2015-04-30 (×8): 1 mg via INTRAVENOUS
  Filled 2015-04-29 (×8): qty 1

## 2015-04-29 MED ORDER — OXYCODONE-ACETAMINOPHEN 5-325 MG PO TABS
1.0000 | ORAL_TABLET | ORAL | Status: DC | PRN
  Administered 2015-04-30: 2 via ORAL
  Filled 2015-04-29: qty 2

## 2015-04-29 MED ORDER — FENTANYL CITRATE (PF) 100 MCG/2ML IJ SOLN
25.0000 ug | INTRAMUSCULAR | Status: AC | PRN
Start: 2015-04-29 — End: 2015-04-29
  Administered 2015-04-29 (×6): 25 ug via INTRAVENOUS

## 2015-04-29 SURGICAL SUPPLY — 31 items
BAG URO DRAIN 2000ML W/SPOUT (MISCELLANEOUS) ×3 IMPLANT
CANISTER SUCT 1200ML W/VALVE (MISCELLANEOUS) ×3 IMPLANT
CATH FOLEY 2WAY  5CC 16FR (CATHETERS) ×2
CATH URTH 16FR FL 2W BLN LF (CATHETERS) ×1 IMPLANT
DRAPE PERI LITHO V/GYN (MISCELLANEOUS) ×3 IMPLANT
DRAPE SHEET LG 3/4 BI-LAMINATE (DRAPES) ×3 IMPLANT
DRAPE UNDER BUTTOCK W/FLU (DRAPES) ×3 IMPLANT
ELECT REM PT RETURN 9FT ADLT (ELECTROSURGICAL) ×3
ELECTRODE REM PT RTRN 9FT ADLT (ELECTROSURGICAL) ×1 IMPLANT
GAUZE PACK 2X3YD (MISCELLANEOUS) ×3 IMPLANT
GLOVE BIO SURGEON STRL SZ8 (GLOVE) ×6 IMPLANT
GLOVE INDICATOR 8.0 STRL GRN (GLOVE) ×3 IMPLANT
GOWN STRL REUS W/ TWL LRG LVL3 (GOWN DISPOSABLE) ×2 IMPLANT
GOWN STRL REUS W/ TWL XL LVL3 (GOWN DISPOSABLE) ×1 IMPLANT
GOWN STRL REUS W/TWL LRG LVL3 (GOWN DISPOSABLE) ×4
GOWN STRL REUS W/TWL XL LVL3 (GOWN DISPOSABLE) ×2
KIT RM TURNOVER CYSTO AR (KITS) ×3 IMPLANT
LABEL OR SOLS (LABEL) ×3 IMPLANT
NS IRRIG 500ML POUR BTL (IV SOLUTION) ×3 IMPLANT
PACK BASIN MINOR ARMC (MISCELLANEOUS) ×3 IMPLANT
PAD OB MATERNITY 4.3X12.25 (PERSONAL CARE ITEMS) ×3 IMPLANT
PAD PREP 24X41 OB/GYN DISP (PERSONAL CARE ITEMS) ×3 IMPLANT
SOL PREP PVP 2OZ (MISCELLANEOUS) ×3
SOLUTION PREP PVP 2OZ (MISCELLANEOUS) ×1 IMPLANT
SUT CHROMIC 0 CT 1 (SUTURE) ×3 IMPLANT
SUT CHROMIC 1-0 (SUTURE) ×3 IMPLANT
SUT CHROMIC 2 0 CT 1 (SUTURE) ×9 IMPLANT
SUT VIC AB 0 CT1 27 (SUTURE) ×6
SUT VIC AB 0 CT1 27XCR 8 STRN (SUTURE) ×3 IMPLANT
SUT VIC AB 0 CT1 36 (SUTURE) ×3 IMPLANT
SYRINGE 10CC LL (SYRINGE) ×3 IMPLANT

## 2015-04-29 NOTE — Interval H&P Note (Signed)
History and Physical Interval Note:  04/29/2015 1:00 PM  Elizabeth Flynn  has presented today for surgery, with the diagnosis of abnormal uterine bleeding  The various methods of treatment have been discussed with the patient and family. After consideration of risks, benefits and other options for treatment, the patient has consented to  Procedure(s): HYSTERECTOMY VAGINAL/ BSO (Bilateral) as a surgical intervention .  The patient's history has been reviewed, patient examined, no change in status, stable for surgery.  I have reviewed the patient's chart and labs.  Questions were answered to the patient's satisfaction.     Hassell Done A Defrancesco

## 2015-04-29 NOTE — H&P (View-Only) (Signed)
Subjective:    Patient is a 45 y.o. G5P205fmale scheduled for TVH BSO. Indications for procedure are Abnormal uterine bleeding; breast cancer on tamoxifen therapy. Recent Hysteroscopy/D&C demonstrated secretory Endometrium and endometrial polyps without evidence of hyperplasia or carcinoma.   Pertinent Gynecological History: Menses: irregular occurring approximately every uncertain days without intermenstrual spotting Bleeding: On tamoxifen therapy  Discussed Blood/Blood Products: yes   Menstrual History: OB History    Gravida Para Term Preterm AB TAB SAB Ectopic Multiple Living   5 2 2  3 1 2   2       Menarche age: NA  No LMP recorded (lmp unknown).    Past Medical History  Diagnosis Date  . Kidney stones   . Anemia   . Ovarian cyst   . Blood transfusion without reported diagnosis   . Hypertension   . Stroke ([facility]     Sept. 2014  . Anxiety   . Headache   . MRSA infection 2009  . Dysrhythmia     TACHYCARDIA  . Enlarged heart   . BRCA negative 10-16-14  . PONV (postoperative nausea and vomiting)     DRY HEAVING AFTER BREAST LUMPECTOMY 10-2014  . [place]    Upper and inner quadrant right breast, Invasive ductal carcinoma, Atypical Ductal Hyperplasia, ER/PR Pos, Her 2 Neg    Past Surgical History  Procedure Laterality Date  . Cholecystectomy    . Tonsillectomy    . Hernia repair    . Tubal ligation    . Lithotripsy    . Breast lumpectomy with sentinel lymph node biopsy Right 10/23/2014    Procedure: Right breast lumpectomy with sentinel node biopsy ;  Surgeon: SChristene Lye MD;  Location: [facility] ORS;  Service: General;  Laterality: Right;  . Mastectomy modified radical Right 12/05/2014    Procedure: MASTECTOMY MODIFIED RADICAL;  Surgeon: SChristene Lye MD;  Location: [facility] ORS;  Service: General;  Laterality: Right;  . Breast biopsy Right 10/10/2014    Right breast invasive carcinoma. T1A, N0.  . Dilatation &  currettage/hysteroscopy with resectocope N/A 03/18/2015    Procedure: DILATATION & CURETTAGE/HYSTEROSCOPY WITH RESECTOCOPE;  Surgeon: MBrayton Mars MD;  Location: [facility] ORS;  Service: Gynecology;  Laterality: N/A;    OB History  Gravida Para Term Preterm AB SAB TAB Ectopic Multiple Living  5 2 2  3 2 1   2     # Outcome Date GA Lbr Len/2nd Weight Sex Delivery Anes PTL Lv  5 Term 1997   7 lb 9.6 oz (3.447 kg) M Vag-Spont   Y  4 TAB 1992          3 SAB 1992          2 Term 1991   7 lb 4.8 oz (3.311 kg) M Vag-Spont   Y  1 SAB               Social History   Social History  . Marital Status: Divorced    Spouse Name: N/A  . Number of Children: N/A  . Years of Education: N/A   Occupational History  . not working    Social History Main Topics  . Smoking status: Former Smoker    Types: Cigarettes  . Smokeless tobacco: None  . Alcohol Use: No     Comment: SOBER FOR 3 [id number]  . Drug Use: No  . Sexual Activity: No   Other Topics Concern  . None   Social History Narrative  Family History  Problem Relation Age of Onset  . Breast cancer Cousin 19  . Heart disease Maternal Grandmother   . Diabetes Paternal Grandmother   . Heart disease Paternal Grandmother   . Ovarian cancer Neg Hx   . Colon cancer Neg Hx      (Not in a hospital admission)  Allergies  Allergen Reactions  . Hydrocodone-Acetaminophen Hives and Itching  . Reglan [Metoclopramide] Other (See Comments)    "freaks me out, makes me nervous"  . Clindamycin/Lincomycin Rash  . Morphine And Related Rash    Review of Systems Constitutional: No recent fever/chills/sweats Respiratory: No recent cough/bronchitis Cardiovascular: No chest pain Gastrointestinal: No recent nausea/vomiting/diarrhea Genitourinary: No UTI symptoms Hematologic/lymphatic:No history of coagulopathy or recent blood thinner use    Objective:    BP 150/84 mmHg  Pulse 64  Ht 5' 7"  (1.702 m)  Wt 207 lb 8 oz (94.121 kg)   BMI 32.49 kg/m2  LMP  (LMP Unknown)  General:   Normal  Skin:   normal  HEENT:  Normal  Neck:  Supple without Adenopathy or Thyromegaly  Lungs:   Heart:              Breasts:   Abdomen:  Pelvis:  M/S   Extremeties:  Neuro:    clear to auscultation bilaterally   Normal without murmur   Not Examined   soft, non-tender; bowel sounds normal; no masses,  no organomegaly   Exam deferred to OR  No CVAT  Warm/Dry   Normal          Assessment:    1.  Abnormal uterine bleeding. 2.  History of breast cancer; on tamoxifen therapy. 3.  Status post hysteroscopy/D&C with normal pathology   Plan:   1.  TVH BSO  Counseling: The patient is to undergo TVH BSO on 04/29/2015.  She is understanding of the planned procedure and is aware of and is accepting of all surgical risks which include but are not limited to bleeding, infection, pelvic organ injury with need for repair, but that disorders, anesthesia risks, etc.  The patient will be surgically menopausal.  Because of her breast cancer diagnosis, she will not be on estrogen replacement therapy in the near future.  She is understanding of this and is accepting of all risks.  She is ready and willing to proceed with surgery as scheduled.  Brayton Mars, MD  Note: This dictation was prepared with Dragon dictation along with smaller phrase technology. Any transcriptional errors that result from this process are unintentional.

## 2015-04-29 NOTE — Anesthesia Preprocedure Evaluation (Signed)
Anesthesia Evaluation  Patient identified by MRN, date of birth, ID band Patient awake    Reviewed: Allergy & Precautions, NPO status , Patient's Chart, lab work & pertinent test results  History of Anesthesia Complications (+) PONVNegative for: history of anesthetic complications  Airway Mallampati: I       Dental  (+) Teeth Intact   Pulmonary neg pulmonary ROS, neg shortness of breath, former smoker,           Cardiovascular hypertension, Pt. on medications + dysrhythmias (tachycardia)      Neuro/Psych Anxiety Depression CVA, No Residual Symptoms    GI/Hepatic Neg liver ROS, GERD (no problems since hiatal hernia repair)  Controlled,  Endo/Other  negative endocrine ROS  Renal/GU Renal disease (stones)     Musculoskeletal   Abdominal   Peds  Hematology  (+) anemia ,   Anesthesia Other Findings   Reproductive/Obstetrics                             Anesthesia Physical Anesthesia Plan  ASA: III  Anesthesia Plan: General   Post-op Pain Management:    Induction: Intravenous  Airway Management Planned: Oral ETT  Additional Equipment:   Intra-op Plan:   Post-operative Plan:   Informed Consent: I have reviewed the patients History and Physical, chart, labs and discussed the procedure including the risks, benefits and alternatives for the proposed anesthesia with the patient or authorized representative who has indicated his/her understanding and acceptance.     Plan Discussed with:   Anesthesia Plan Comments:         Anesthesia Quick Evaluation

## 2015-04-29 NOTE — Anesthesia Procedure Notes (Signed)
Procedure Name: Intubation Date/Time: 04/29/2015 1:50 PM Performed by: Doreen Salvage Pre-anesthesia Checklist: Patient identified, Patient being monitored, Timeout performed, Emergency Drugs available and Suction available Patient Re-evaluated:Patient Re-evaluated prior to inductionOxygen Delivery Method: Circle system utilized Preoxygenation: Pre-oxygenation with 100% oxygen Intubation Type: IV induction Ventilation: Mask ventilation without difficulty Laryngoscope Size: Mac and 3 Grade View: Grade I Tube type: Oral Tube size: 7.0 mm Number of attempts: 1 Airway Equipment and Method: Stylet Placement Confirmation: ETT inserted through vocal cords under direct vision,  positive ETCO2 and breath sounds checked- equal and bilateral Secured at: 21 cm Tube secured with: Tape Dental Injury: Teeth and Oropharynx as per pre-operative assessment

## 2015-04-29 NOTE — Op Note (Signed)
[facility] PROCEDURE DATE: 04/29/2015   PREOPERATIVE DIAGNOSIS:  1. Abnormal uterine bleeding 2. Endometrial polyps 3. History of breast cancer,, on tamoxifen therapy POSTOPERATIVE DIAGNOSIS: The same PROCEDURE: Transvaginal hysterectomy, bilateral salpingoophorectomy SURGEON:  Brayton Mars, MD ASSISTANT: Dr. Marcelline Mates; PA-S Loni Muse  INDICATIONS: 45 y.o. [id number] with aforementioned preoperative diagnoses here today for definitive surgical management.   Risks of surgery were discussed with the patient including but not limited to: bleeding which may require transfusion or reoperation; infection which may require antibiotics; injury to bowel, bladder, ureters or other surrounding organs; need for additional procedures including laparotomy; thromboembolic phenomenon, incisional problems and other postoperative/anesthesia complications. Written informed consent was obtained.    FINDINGS:  Fibroid uterus, normal adnexa bilaterally.  No evidence of endometriosis, adhesions or any other abdominal/pelvic abnormality.  Normal upper abdomen.  ANESTHESIA:    General INTRAVENOUS FLUIDS: 1000 ml ESTIMATED BLOOD LOSS: 400 ml SPECIMENS: Uterus, cervix, bilateral fallopian tubes and ovaries. COMPLICATIONS: None immediate  Description of procedure: Patient was brought to the operating room where she was placed in the supine position. General endotracheal anesthesia was induced without difficulty. She was placed in the dorsal lithotomy position using the candycane stirrups. A Betadine perineal intravaginal prep and drape was performed in standard fashion. Foley catheter was placed and was draining clear yellow urine. Foley catheter was clamped. Weighted speculum was placed in the vagina. Double-tooth tenaculum was placed on the cervix for uterine manipulation. Posterior colpotomy was made with Mayo scissors. Uterosacral ligaments were clamped cut and stick tied using 0 Vicryl suture. The  cervix was circumscribed to dissect the vagina and bladder off lower uterine segment. Eventually the anterior cul-de-sac was entered. Sequentially the cardinal broad ligament complexes were clamped cut and stick tied using 0 Vicryl suture. This was carried out to the level of the utero-ovarian ligaments which were then cross clamped cut and stick tied. Specimen was removed from the operative field. The adnexa were then sequentially identified sponge sticks. The adnexa was grasped with Babcock clamps. The infundibulopelvic ligament was crossclamped with curved Heaney clamp and the adnexa was excised from the operative field. Free tie was placed along the pedicle. This was followed by a stick tie. Similar procedure was carried out on contralateral adnexa. Good hemostasis was obtained. The peritoneum was then reapproximated using a pursestring stitch of 0 Vicryl. The posterior cuff was closed using a running locking baseball stitch of #1 chromic. The vagina was then closed with simple interrupted sutures of 2-0 chromic. Upon completion of the procedure all instrumentation was removed. Patient was then awakened extubated and taken to recovery room in satisfactory condition.    LAVH/BSO: Laparoscopy to just taking the adnexa and completing vaginally  PROCEDURE IN DETAIL:  The patient received intravenous antibiotics and had sequential compression devices applied to her lower extremities while in the preoperative area.  She was then taken to the operating room where general anesthesia was administered and was found to be adequate.  She was placed in the dorsal lithotomy position, and was prepped and draped in a sterile manner.  A Foley catheter was inserted into her bladder and attached to constant drainage and a uterine manipulator was then advanced into the uterus .  After an adequate timeout was performed, attention was then turned to the patient's abdomen where a 11-mm skin incision was made in the umbilical  fold.  The Veress needle was carefully introduced into the peritoneal cavity through the abdominal wall.  Intraperitoneal placement was confirmed by drop  in intraabdominal pressure with insufflation of carbon dioxide gas.  Adequate pneumoperitoneum was obtained, and the 11-mm trocar and sleeve were then advanced without difficulty into the abdomen where intraabdominal placement was confirmed by the laparoscope. A survey of the patient's pelvis and abdomen revealed the findings above.   Bilateral 5-mm lower quadrant ports were then placed under direct visualization.   The round ligaments were then clamped and transected with the Enseal on both sides. The bilateral infundibulopelvic ligaments were also clamped and transected with Enseal.  Excellent hemostasis was noted, the decision was made to leave the trocars in place and proceed with completing the hysterectomy via the vaginal route .  Attention was then turned to her pelvis.  A weighted speculum was then placed in the vagina, and the anterior and posterior lips of the cervix were grasped bilaterally with tenaculums.  The cervix was then injected circumferentially with 0.5% Marcaine with epinephrine solution to maintain hemostasis.  The cervix was then circumferentially incised, and the anterior cul-de-sac was then entered sharply without difficulty and a retractor was placed.  The same procedure was performed posteriorly and the posterior cul-de-sac was entered sharply without difficulty.  A long weighted speculum was inserted into the posterior cul-de-sac.  The Heaney clamp was then used to clamp the uterosacral ligaments on either side.  They were then cut and sutured ligated with 0 Vicryl, and were held with a tag for later identification. Of note, all sutures used in this case were 0 Vicryl unless otherwise noted.   The cardinal ligaments were then clamped, cut and ligated bilaterally. The uterine vessels and broad ligaments were then serially clamped with  the Heaney clamps, cut, and suture ligated on both sides.  The uterus was noted to be freed from all ligaments and was then delivered and sent to pathology.   After completion of the hysterectomy, all pedicles from the uterosacral ligament to the cornua were examined hemostasis was confirmed.   The vaginal cuff was then closed in a running locked fashion with 0 Vicryl with care given to incorporate the uterosacral pedicles bilaterally.  All instruments were then removed from the pelvis, and a vaginal packing saturated with estrogen cream was placed.    Attention was then returned to her abdomen which was insufflated again with carbon dioxide gas.  The laparoscope was used to survey the operative site, and it was found to be hemostatic.   No intraoperative injury to other surrounding organs was noted.  The abdomen was desufflated and all instruments were then removed from the patient's abdomen.  The fascial incision of the umbilicus was closed with a 0 Vicryl figure of eight stitch.  All skin incisions were closed with Dermabond. The patient tolerated the procedures well.  All instruments, needles, and sponge counts were correct x 2. The patient was taken to the recovery room awake, extubated and in stable condition.    Alternate method of LAVH/BSO : Laparoscopy down to taking the uterine vessels then vaginal completion. Also going in via [place].  PROCEDURE IN DETAIL:  The patient received intravenous antibiotics and had sequential compression devices applied to her lower extremities while in the preoperative area.  She was then taken to the operating room where general anesthesia was administered and was found to be adequate.  She was placed in the dorsal lithotomy position, and was prepped and draped in a sterile manner.  A uterine manipulator was placed at this time.  A Foley catheter was inserted into her bladder and  attached to constant drainage.  After an adequate timeout was performed, attention was  turned to the abdomen where an umbilical incision was made with the scalpel.  The Optiview 11-mm trocar and sleeve were then advanced without difficulty with the laparoscope under direct visualization into the abdomen.  The abdomen was then insufflated with carbon dioxide gas and adequate pneumoperitoneum was obtained.  A survey of the patient's pelvis and abdomen revealed the findings above.  Bilateral 5-mm lower quadrant ports were then placed under direct visualization.  Attention was turned to the infundibulopelvic ligament on the patient's left side, which was clamped using the Harmonic scalpel and ligated.  Good hemostasis was noted. The broad ligament was also ligated with the Harmonic scalpel working towards the round ligament.  The round and rest of the broad ligaments were then clamped and transected with the Harmonic scalpel.   The uterine artery was then skeletonized and a bladder flap was created.  The bladder was then bluntly dissected off the lower uterine segment.  At this point, attention was turned to the uterine vessels, which were clamped and ligated using the Harmonic scalpel.   Good hemostasis was noted overall.   Attention was then turned to the patient's right side, which was treated in a similar manner by taking the infundibulopelvic ligament, the round ligament and the broad ligaments and the bladder flap creation was completed.  The uterine artery was also clamped and transected in a similar fashion.  The ureters were noted to be safely away from the area of dissection.  Excellent hemostasis was noted, the decision was made to leave the trocars in place and proceed with completing the hysterectomy via the vaginal route.  Attention was then turned to her pelvis.  A weighted speculum was then placed in the vagina, and the anterior and posterior lips of the cervix were grasped bilaterally with tenaculums.  The cervix was then injected circumferentially with 0.5% Marcaine with epinephrine  solution to maintain hemostasis.  The cervix was then circumferentially incised, and the anterior cul-de-sac was then entered sharply without difficulty and a retractor was placed.  The same procedure was performed posteriorly and the posterior cul-de-sac was entered sharply without difficulty.  A long weighted speculum was inserted into the posterior cul-de-sac.  The Heaney clamp was then used to clamp the uterosacral ligaments on either side.  They were then cut and sutured ligated with 0 Vicryl, and were held with a tag for later identification. Of note, all sutures used in this case were 0 Vicryl unless otherwise noted.   The cardinal ligaments were then clamped, cut and ligated bilaterally. The uterus was noted to be freed from all ligaments and was then delivered and sent to pathology.   After completion of the hysterectomy, all pedicles from the uterosacral ligament to the cornua were examined hemostasis was confirmed.   The inferior vaginal cuff was then closed with a reefing stitch; and the entire cuff was reapproximated with 0 Vicryl interrupted stitches with care given to incorporate the uterosacral pedicles bilaterally.  All instruments were then removed from the pelvis.   Attention was then returned to her abdomen which was insufflated again with carbon dioxide gas.  The laparoscope was used to survey the operative site, and it was found to be hemostatic.   No intraoperative injury to other surrounding organs was noted.  The abdomen was desufflated and all instruments were then removed from the patient's abdomen.  The fascial incision of the umbilicus was closed with  a 0 Vicryl figure of eight stitch.  All skin incisions were closed with Dermabond. The patient tolerated the procedures well.  All instruments, needles, and sponge counts were correct x 2. The patient was taken to the recovery room awake, extubated and in stable condition.

## 2015-04-29 NOTE — Transfer of Care (Signed)
Immediate Anesthesia Transfer of Care Note  Patient: Elizabeth Flynn  Procedure(s) Performed: Procedure(s): HYSTERECTOMY VAGINAL/ BILATERAL SALPINGOOPHORECTOMY (Bilateral)  Patient Location: PACU  Anesthesia Type:General  Level of Consciousness: awake and alert   Airway & Oxygen Therapy: Patient Spontanous Breathing and Patient connected to face mask oxygen  Post-op Assessment: Report given to RN and Post -op Vital signs reviewed and stable  Post vital signs: Reviewed and stable  Last Vitals:  Filed Vitals:   04/29/15 1042  BP: 182/111  Pulse: 77  Temp: 36.5 C  Resp: 16    Complications: No apparent anesthesia complications

## 2015-04-29 NOTE — OR Nursing (Signed)
Patient was a very difficult IV stick. After several attempts, a 22 gauge angio was obtained in left foot.  SCD's removed from left leg.

## 2015-04-30 ENCOUNTER — Encounter: Payer: Self-pay | Admitting: Obstetrics and Gynecology

## 2015-04-30 DIAGNOSIS — N840 Polyp of corpus uteri: Secondary | ICD-10-CM | POA: Diagnosis not present

## 2015-04-30 LAB — HEMOGLOBIN: HEMOGLOBIN: 7.5 g/dL — AB (ref 12.0–16.0)

## 2015-04-30 MED ORDER — HYDROMORPHONE HCL 2 MG PO TABS
2.0000 mg | ORAL_TABLET | ORAL | Status: DC | PRN
  Administered 2015-04-30 – 2015-05-01 (×7): 2 mg via ORAL
  Filled 2015-04-30 (×7): qty 1

## 2015-04-30 MED ORDER — DIPHENHYDRAMINE HCL 50 MG/ML IJ SOLN
25.0000 mg | Freq: Four times a day (QID) | INTRAMUSCULAR | Status: DC | PRN
  Administered 2015-04-30: 25 mg via INTRAVENOUS
  Filled 2015-04-30: qty 1

## 2015-04-30 NOTE — Progress Notes (Signed)
1 Day Post-Op Procedure(s) (LRB): HYSTERECTOMY VAGINAL/ BILATERAL SALPINGOOPHORECTOMY (Bilateral)  Subjective: Patient reports tolerating PO and no problems voiding.  Having moderate discomfort. Ambulatory. Voiding.  Objective: I have reviewed patient's vital signs, intake and output, medications and labs.  General: alert and cooperative Resp: clear to auscultation bilaterally Cardio: regular rate and rhythm, S1, S2 normal, no murmur, click, rub or gallop GI: soft, non-tender; bowel sounds normal; no masses,  no organomegaly Extremities: extremities normal, atraumatic, no cyanosis or edema and Homans sign is negative, no sign of DVT Vaginal Bleeding: none  Assessment: s/p Procedure(s): HYSTERECTOMY VAGINAL/ BILATERAL SALPINGOOPHORECTOMY (Bilateral): Anemia, asymptomatic  Plan: Advance diet Encourage ambulation Advance to PO medication Discontinue IV fluids D/C tomorrow  LOS: 1 day    Alanda Slim Aki Abalos 04/30/2015, 1:11 PM

## 2015-04-30 NOTE — Anesthesia Postprocedure Evaluation (Signed)
Anesthesia Post Note  Patient: Elizabeth Flynn  Procedure(s) Performed: Procedure(s) (LRB): HYSTERECTOMY VAGINAL/ BILATERAL SALPINGOOPHORECTOMY (Bilateral)  Patient location during evaluation: PACU Anesthesia Type: General Level of consciousness: awake and alert Pain management: pain level controlled Vital Signs Assessment: post-procedure vital signs reviewed and stable Respiratory status: spontaneous breathing and respiratory function stable Cardiovascular status: stable Anesthetic complications: no    Last Vitals:  Filed Vitals:   04/30/15 0359 04/30/15 0757  BP: 127/56 117/61  Pulse: 77 66  Temp: 36.6 C 36.4 C  Resp: 18 18    Last Pain:  Filed Vitals:   04/30/15 0804  PainSc: 8                  Elgie Landino K

## 2015-05-01 DIAGNOSIS — Z9079 Acquired absence of other genital organ(s): Secondary | ICD-10-CM

## 2015-05-01 DIAGNOSIS — Z90722 Acquired absence of ovaries, bilateral: Secondary | ICD-10-CM

## 2015-05-01 DIAGNOSIS — Z90710 Acquired absence of both cervix and uterus: Secondary | ICD-10-CM

## 2015-05-01 DIAGNOSIS — N840 Polyp of corpus uteri: Secondary | ICD-10-CM | POA: Diagnosis not present

## 2015-05-01 LAB — CBC WITH DIFFERENTIAL/PLATELET
BASOS ABS: 0.1 10*3/uL (ref 0–0.1)
Basophils Relative: 1 %
EOS PCT: 3 %
Eosinophils Absolute: 0.2 10*3/uL (ref 0–0.7)
HEMATOCRIT: 23.7 % — AB (ref 35.0–47.0)
HEMOGLOBIN: 7.2 g/dL — AB (ref 12.0–16.0)
LYMPHS PCT: 30 %
Lymphs Abs: 2.3 10*3/uL (ref 1.0–3.6)
MCH: 22.1 pg — ABNORMAL LOW (ref 26.0–34.0)
MCHC: 30.3 g/dL — ABNORMAL LOW (ref 32.0–36.0)
MCV: 72.8 fL — AB (ref 80.0–100.0)
Monocytes Absolute: 0.5 10*3/uL (ref 0.2–0.9)
Monocytes Relative: 6 %
NEUTROS ABS: 4.6 10*3/uL (ref 1.4–6.5)
NEUTROS PCT: 60 %
PLATELETS: 270 10*3/uL (ref 150–440)
RBC: 3.25 MIL/uL — AB (ref 3.80–5.20)
RDW: 16.5 % — ABNORMAL HIGH (ref 11.5–14.5)
WBC: 7.6 10*3/uL (ref 3.6–11.0)

## 2015-05-01 LAB — SURGICAL PATHOLOGY

## 2015-05-01 MED ORDER — HYDROMORPHONE HCL 2 MG PO TABS: 2.0000 mg | ORAL_TABLET | ORAL | Status: DC | PRN

## 2015-05-01 NOTE — Discharge Summary (Signed)
Physician Discharge Summary  Patient ID: Elizabeth Flynn MRN: [id number] DOB/AGE: [birth year] 45 y.o.  Admit date: 04/29/2015 Discharge date: 05/01/2015  Admission Diagnoses: AUB; Breast Cancer on Tamoxifen; Anemia  Discharge Diagnoses:  SAA  Operative Procedures: Procedure(s): HYSTERECTOMY VAGINAL/ BILATERAL SALPINGOOPHORECTOMY (Bilateral)  Hospital Course: Uncomplicated . Chronic Anemia, stable, asymptomatic   Significant Diagnostic Studies:  Lab Results  Component Value Date   HGB 7.5* 04/30/2015   HGB 8.7* 04/26/2015   HGB 9.5* 03/28/2015   Lab Results  Component Value Date   HCT 28.3* 04/26/2015   HCT 30.9* 03/28/2015   HCT 32.3* 03/18/2015   CBC Latest Ref Rng 04/30/2015 04/26/2015 03/28/2015  WBC 3.6 - 11.0 K/uL - 7.1 8.7  Hemoglobin 12.0 - 16.0 g/dL 7.5(L) 8.7(L) 9.5(L)  Hematocrit 35.0 - 47.0 % - 28.3(L) 30.9(L)  Platelets 150 - 440 K/uL - 308 367     Discharged Condition: stable  Discharge Exam: Blood pressure 129/69, pulse 70, temperature 99 F (37.2 C), temperature source Oral, resp. rate 18, height 5\' 7"  (1.702 m), weight 200 lb (90.719 kg), last menstrual period 03/26/2015, SpO2 97 %. Incision/Wound: NA  Disposition: 01-Home or Self Care  Discharge Instructions    Discharge patient    Complete by:  As directed             Medication List    STOP taking these medications        ibuprofen 800 MG tablet  Commonly known as:  ADVIL,MOTRIN      TAKE these medications        acetaminophen 500 MG tablet  Commonly known as:  TYLENOL  Take 2 tablets (1,000 mg total) by mouth every 6 (six) hours as needed. For pain     amLODipine 10 MG tablet  Commonly known as:  NORVASC  Take 1 tablet (10 mg total) by mouth daily.     ferrous sulfate 325 (65 FE) MG tablet  Take 325 mg by mouth daily.     FLUoxetine 10 MG capsule  Commonly known as:  PROZAC  Take 10 mg by mouth every morning.     HYDROmorphone 2 MG tablet  Commonly known as:  DILAUDID   Take 1 tablet (2 mg total) by mouth every 3 (three) hours as needed for moderate pain or severe pain.     lamoTRIgine 25 MG tablet  Commonly known as:  LAMICTAL  Take 25 mg by mouth every morning.     lisinopril 20 MG tablet  Commonly known as:  PRINIVIL,ZESTRIL  Take 1 tablet (20 mg total) by mouth every evening.     promethazine 25 MG tablet  Commonly known as:  PHENERGAN  TAKE 1/2-1 TABLET BY MOUTH EVERY 6 HOURS AS NEEDED FOR NAUSEA OR VOMITING     tamoxifen 20 MG tablet  Commonly known as:  NOLVADEX  TAKE 1 TABLET (20 MG TOTAL) BY MOUTH DAILY.     vitamin C 500 MG tablet  Commonly known as:  ASCORBIC ACID  Take 500 mg by mouth daily.           Follow-up Information    Follow up with Brayton Mars, MD. Go in 1 week.   Specialties:  Obstetrics and Gynecology, Radiology   Why:  Post Op Check   Contact information:   [address] [place] [address] [phone]       Signed: Alanda Slim Defrancesco 05/01/2015, 7:57 AM

## 2015-05-01 NOTE — Progress Notes (Signed)
Pt discharged home.  Discharge instructions, prescriptions and follow up appointment given to and reviewed with pt.  Pt verbalized understanding.  Escorted by auxillary. 

## 2015-05-01 NOTE — Progress Notes (Signed)
During removal of external jugular IV catheter, catheter became stuck with what felt like a hard nodule at the base.  Removal was stopped and CCU charge nurse was called to come assess.  She was uncomfortable removing the catheter and said to call the MD.  Nursing Supervisor was contacted and she was also uncomfortable removing catheter.  Dr. Enzo Bi was called and he stated to tell Anesthesia to come remove it since they placed it in the OR on 1/23.  Dr Kayleen Memos with Anesthesia was called and he stated that he would send some one to assess it.

## 2015-05-02 ENCOUNTER — Telehealth: Payer: Self-pay

## 2015-05-02 NOTE — Telephone Encounter (Signed)
Arbie Cookey called for pt- (10:30 am) she states pt is dry heaving. Has a temp of 100.9. Vaginal bleeding is spotting. Bottom is sore. Taking her pain meds but still in a lot of pain. S/p tvh bso on 04/29/2015. Taking Dilaudid 2mg  q3. She is taking tylenol q 3 also. Last pain med she had was at 7:30 am. I advised pt to take Dilaudid q 3 hours on the dot. Try to drink and a brat diet as tolerated. She is to take tylenol es q 6hours. Arbie Cookey to call be back with an update  around  3pm.-Carol called me back(6 hours later) and states [place]onell as taken her pain meds twice in the last 6 hours. Along with Zofran. She is able to hold liquids down, toast, and crackers. Pain is still a 9. Per mad she is to add ibup 800 q 8. Arbie Cookey is aware her surgery was challenging and she is anemic which can explain why she feels so bad. Arbie Cookey will administer all meds through out the nite. Dilaudid 2mg  q 6, tylenol es q 6h, and ibup 800 q 8.  She is to call me in am with an update. If no better she will need to be seen. If pt develops fever over 101 or pain not relived with pain meds she is to go to ER. Arbie Cookey voices understanding.

## 2015-05-03 ENCOUNTER — Telehealth: Payer: Self-pay

## 2015-05-03 MED ORDER — HYDROMORPHONE HCL 2 MG PO TABS: 2.0000 mg | ORAL_TABLET | ORAL | Status: DC | PRN

## 2015-05-03 MED ORDER — IBUPROFEN 800 MG PO TABS
800.0000 mg | ORAL_TABLET | Freq: Three times a day (TID) | ORAL | Status: DC | PRN
Start: 2015-05-03 — End: 2015-05-03

## 2015-05-03 MED ORDER — IBUPROFEN 800 MG PO TABS: 800.0000 mg | ORAL_TABLET | Freq: Three times a day (TID) | ORAL | Status: DC | PRN

## 2015-05-03 NOTE — Telephone Encounter (Signed)
Elizabeth Flynn states Elizabeth Flynn is doing a little better. She received her meds q 3h all thru the nite. NO fever. Pos nausea but given Zofran which helps. Pos eating and drinking. Still pain on right side. Advised to continue with meds as directed. Stool softeners. Heating pad. Per mad ok to give more Dilaudid and erx ibup 800. Contact on call with fever or severe pain not relived with pain meds. Elizabeth Flynn voices understanding. Dilaudid rx signed by mns and left up front for p/u.

## 2015-05-08 ENCOUNTER — Ambulatory Visit (INDEPENDENT_AMBULATORY_CARE_PROVIDER_SITE_OTHER): Payer: Medicaid Other | Admitting: Obstetrics and Gynecology

## 2015-05-08 ENCOUNTER — Encounter: Payer: Self-pay | Admitting: Obstetrics and Gynecology

## 2015-05-08 VITALS — BP 165/90 | HR 79 | Ht 67.0 in | Wt 205.3 lb

## 2015-05-08 DIAGNOSIS — N8003 Adenomyosis of the uterus: Secondary | ICD-10-CM

## 2015-05-08 DIAGNOSIS — N809 Endometriosis, unspecified: Secondary | ICD-10-CM

## 2015-05-08 DIAGNOSIS — Z90710 Acquired absence of both cervix and uterus: Secondary | ICD-10-CM

## 2015-05-08 DIAGNOSIS — N800 Endometriosis of uterus: Secondary | ICD-10-CM

## 2015-05-08 DIAGNOSIS — R102 Pelvic and perineal pain: Secondary | ICD-10-CM

## 2015-05-08 DIAGNOSIS — N939 Abnormal uterine and vaginal bleeding, unspecified: Secondary | ICD-10-CM

## 2015-05-08 DIAGNOSIS — Z09 Encounter for follow-up examination after completed treatment for conditions other than malignant neoplasm: Secondary | ICD-10-CM

## 2015-05-08 NOTE — Patient Instructions (Signed)
1.  Resume activities as tolerated, except for pelvic rest for 5 weeks. 2.  Continue with iron supplementation. 3.  Notify us if anemia symptoms develop. 4.  Return in 5 weeks for final postop check

## 2015-05-08 NOTE — Progress Notes (Signed)
Chief complaint: 1.  One week postop check. 2.  Status post TVH, BSO. 3.  History of abnormal uterine bleeding.  Patient presents for 1 week postop check.  She is doing well with normal bowel and bladder function.  Pain is minimal.  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  Postoperatively she had a nadir hemoglobin of 7.2; she normally lives in the anemia range and does not feel out of sorts.  She will contact us if she is developed symptoms of anemia.  Pathology: Leiomyoma and adenomyosis; ovaries and tubes-NED  OBJECTIVE: BP 165/90 mmHg  Pulse 79  Ht 5\' 7"  (1.702 m)  Wt 205 lb 4.8 oz (93.123 kg)  BMI 32.15 kg/m2  LMP 03/26/2015 Physical examination deferred  ASSESSMENT: 1.  One week postop checkup-normal. 2.  Status post TVH, BSO. 3.  Pathology: Adenomyosis and leiomyoma.  PLAN: 1.  Gradually resume activities as tolerated with the exception of pelvic rest for another 5 weeks. 2.  Continue with iron supplementation. 3.  Return in 5 weeks for final postop check  Brayton Mars, MD  Note: This dictation was prepared with Dragon dictation along with smaller phrase technology. Any transcriptional errors that result from this process are unintentional.

## 2015-06-12 ENCOUNTER — Encounter: Payer: Self-pay | Admitting: Obstetrics and Gynecology

## 2015-06-12 ENCOUNTER — Ambulatory Visit (INDEPENDENT_AMBULATORY_CARE_PROVIDER_SITE_OTHER): Payer: Medicaid Other | Admitting: Obstetrics and Gynecology

## 2015-06-12 VITALS — BP 138/83 | HR 83 | Ht 67.0 in | Wt 205.1 lb

## 2015-06-12 DIAGNOSIS — Z90710 Acquired absence of both cervix and uterus: Secondary | ICD-10-CM | POA: Insufficient documentation

## 2015-06-12 DIAGNOSIS — Z09 Encounter for follow-up examination after completed treatment for conditions other than malignant neoplasm: Secondary | ICD-10-CM

## 2015-06-12 DIAGNOSIS — D649 Anemia, unspecified: Secondary | ICD-10-CM

## 2015-06-12 NOTE — Patient Instructions (Signed)
1. CBC today 2. Monitor abdominal pelvic pain symptoms 3. Return in 6 months for follow-up 4. Resume all activities without restriction

## 2015-06-12 NOTE — Progress Notes (Signed)
GYN ENCOUNTER NOTE  Subjective:       Elizabeth Flynn is a 45 y.o. [phone] female is here for gynecologic evaluation of the following issues:  1. 6 week postop check 2. Status post TVH, BSO. 3. History of abnormal uterine bleeding and pelvic pain   4.  Adenomyosis   She is doing well with normal bowel and bladder function.  She is not experiencing any significant pelvic pain.She is not experiencing any vaginal discharge or spotting.  Pathology: Adenomyosis   Gynecologic History Patient's last menstrual period was 03/26/2015. Contraception: None, not sexually active Last Pap: 09/12/15. Results were: normal Last mammogram: 09/25/15. Results were: area of distortion in upper, inner right breast.  Obstetric History OB History  Gravida Para Term Preterm AB SAB TAB Ectopic Multiple Living  5 2 2  3 2 1   2     # Outcome Date GA Lbr Len/2nd Weight Sex Delivery Anes PTL Lv  5 Term 1997   7 lb 9.6 oz (3.447 kg) M Vag-Spont   Y  4 TAB 1992          3 SAB 1992          2 Term 1991   7 lb 4.8 oz (3.311 kg) M Vag-Spont   Y  1 SAB               Past Medical History  Diagnosis Date  . Kidney stones   . Anemia   . Ovarian cyst   . Blood transfusion without reported diagnosis 2016    patient has had 25 transfusions throughout her life. her blood levels run chronically low  . Hypertension   . Headache   . MRSA infection 2009  . Enlarged heart   . BRCA negative 10-16-14  . PONV (postoperative nausea and vomiting)     DRY HEAVING AFTER BREAST LUMPECTOMY 10-2014  . Cancer of right breast ([place]) 10/16/2014    Upper and inner quadrant right breast, Invasive ductal carcinoma, Atypical Ductal Hyperplasia, ER/PR Pos, Her 2 Neg  . Diarrhea   . Cardiomyopathy ([place])   . Pancreatitis   . Depression   . Stroke [facility])     Sept. 2014 rt sided bleed  . Stroke ([place])   . GERD (gastroesophageal reflux disease)   . Dysrhythmia     TACHYCARDIA...happens randomly. not seen by any cardiologist  .  Shortness of breath dyspnea     with exertion  . Anxiety     takes nothing    Past Surgical History  Procedure Laterality Date  . Cholecystectomy    . Tonsillectomy    . Hernia repair    . Tubal ligation    . Lithotripsy    . Breast lumpectomy with sentinel lymph node biopsy Right 10/23/2014    Procedure: Right breast lumpectomy with sentinel node biopsy ;  Surgeon: Christene Lye, MD;  Location: [facility] ORS;  Service: General;  Laterality: Right;  . Mastectomy modified radical Right 12/05/2014    Procedure: MASTECTOMY MODIFIED RADICAL;  Surgeon: Christene Lye, MD;  Location: [facility] ORS;  Service: General;  Laterality: Right;  . Breast biopsy Right 10/10/2014    Right breast invasive carcinoma. T1A, N0.  . Dilatation & currettage/hysteroscopy with resectocope N/A 03/18/2015    Procedure: DILATATION & CURETTAGE/HYSTEROSCOPY WITH RESECTOCOPE;  Surgeon: Brayton Mars, MD;  Location: [facility] ORS;  Service: Gynecology;  Laterality: N/A;  . Vaginal hysterectomy Bilateral 04/29/2015    Procedure: HYSTERECTOMY VAGINAL/ BILATERAL SALPINGOOPHORECTOMY;  Surgeon: Alanda Slim  Addysen Louth, MD;  Location: [facility] ORS;  Service: Gynecology;  Laterality: Bilateral;    Current Outpatient Prescriptions on File Prior to Visit  Medication Sig Dispense Refill  . acetaminophen (TYLENOL) 500 MG tablet Take 2 tablets (1,000 mg total) by mouth every 6 (six) hours as needed. For pain 30 tablet 0  . amLODipine (NORVASC) 10 MG tablet Take 1 tablet (10 mg total) by mouth daily. (Patient taking differently: Take 10 mg by mouth every morning. ) 30 tablet 0  . ferrous sulfate 325 (65 FE) MG tablet Take 325 mg by mouth daily.    Marland Kitchen FLUoxetine (PROZAC) 10 MG capsule Take 10 mg by mouth every morning.     Marland Kitchen ibuprofen (ADVIL,MOTRIN) 800 MG tablet Take 1 tablet (800 mg total) by mouth every 8 (eight) hours as needed. 60 tablet 1  . lamoTRIgine (LAMICTAL) 25 MG tablet Take 25 mg by mouth every morning.     Marland Kitchen lisinopril  (PRINIVIL,ZESTRIL) 20 MG tablet Take 1 tablet (20 mg total) by mouth every evening. 30 tablet 0  . promethazine (PHENERGAN) 25 MG tablet TAKE 1/2-1 TABLET BY MOUTH EVERY 6 HOURS AS NEEDED FOR NAUSEA OR VOMITING  1  . tamoxifen (NOLVADEX) 20 MG tablet TAKE 1 TABLET (20 MG TOTAL) BY MOUTH DAILY. 30 tablet 6  . vitamin C (ASCORBIC ACID) 500 MG tablet Take 500 mg by mouth daily.     No current facility-administered medications on file prior to visit.    Allergies  Allergen Reactions  . Hydrocodone-Acetaminophen Hives and Itching  . Reglan [Metoclopramide] Other (See Comments)    "freaks me out, makes me nervous"  . Clindamycin/Lincomycin Rash  . Morphine And Related Rash    Social History   Social History  . Marital Status: Divorced    Spouse Name: N/A  . Number of Children: N/A  . Years of Education: N/A   Occupational History  . not working    Social History Main Topics  . Smoking status: Former Smoker    Types: Cigarettes    Quit date: 04/26/1995  . Smokeless tobacco: Not on file     Comment: no passive smoke in home  . Alcohol Use: No     Comment: SOBER FOR 3 [id number]  . Drug Use: No  . Sexual Activity: No   Other Topics Concern  . Not on file   Social History Narrative    Family History  Problem Relation Age of Onset  . Breast cancer Cousin 84  . Heart disease Maternal Grandmother   . Diabetes Paternal Grandmother   . Heart disease Paternal Grandmother   . Ovarian cancer Neg Hx   . Colon cancer Neg Hx     The following portions of the patient's history were reviewed and updated as appropriate: allergies, current medications, past family history, past medical history, past social history, past surgical history and problem list.  Review of Systems Review of Systems - General ROS: negative for - chills, fatigue, fever, hot flashes, malaise or night sweats Hematological and Lymphatic ROS: negative for - bleeding problems or swollen lymph  nodes Gastrointestinal ROS: negative for - abdominal pain, blood in stools, change in bowel habits and nausea/vomiting Musculoskeletal ROS: negative for - joint pain, muscle pain or muscular weakness Genito-Urinary ROS: negative for - change in menstrual cycle, dysmenorrhea, dyspareunia, dysuria, genital discharge, genital ulcers, hematuria, incontinence, irregular/heavy menses, nocturia. Positive for recurrent lower abdominal pain with exertion/valsalva.  Objective:   BP 138/83 mmHg  Pulse 83  Ht 5'  7" (1.702 m)  Wt 205 lb 1.6 oz (93.033 kg)  BMI 32.12 kg/m2  LMP 03/26/2015 CONSTITUTIONAL: Well-developed, well-nourished female in no acute distress.  HENT:  Normocephalic, atraumatic.  NECK: Normal range of motion, supple, no masses.  Normal thyroid.  SKIN: Skin is warm and dry. No rash noted. Not diaphoretic. No erythema. No pallor. Keo: Alert and oriented to person, place, and time. PSYCHIATRIC: Normal mood and affect. Normal behavior. Normal judgment and thought content. CARDIOVASCULAR: RRR; no rubs, murmurs RESPIRATORY: mild wheezing bilaterally; good air intake  BREASTS: Not Examined ABDOMEN: Soft, non distended; Non tender.  No Organomegaly. PELVIC:  External Genitalia: Normal  BUS: Normal  Vagina: Normal; bimanual exam shows well-healed vaginal cuff without induration or masses  Cervix: Surgically absent  Uterus: Surgically absent  Adnexa: Surgically absent  RV: Normal external exam  Bladder: Nontender MUSCULOSKELETAL: Normal range of motion. No tenderness.  No cyanosis, clubbing, or edema.     Assessment:   1. Anemia, unspecified anemia type - CBC ordered today  2. Postop check, final, normal  3. Status post vaginal hysterectomyBSO - Well-healed, normal exam     Plan:   1. Anemia  Continue iron oral supplementation, pending CBC 2. Post-op TVH-SBO   Follow-up in 6 months  Continue activities as tolerated.  Return sooner if pain persists.  A total of  15 minutes were spent face-to-face with the patient during this encounter and over half of that time dealt with counseling and coordination of care.   Olene Floss, PA-S Brayton Mars, MD   I have seen, interviewed, and examined the patient in conjunction with the [facility].A. student and affirm the diagnosis and management plan. Mickelle Goupil A. Yuleni Burich, MD, FACOG   Note: This dictation was prepared with Dragon dictation along with smaller phrase technology. Any transcriptional errors that result from this process are unintentional.

## 2015-06-20 ENCOUNTER — Encounter: Payer: Self-pay | Admitting: Emergency Medicine

## 2015-06-20 ENCOUNTER — Emergency Department: Payer: Medicaid Other

## 2015-06-20 ENCOUNTER — Emergency Department
Admission: EM | Admit: 2015-06-20 | Discharge: 2015-06-20 | Disposition: A | Discharge status: Home / Self Care | Payer: Medicaid Other | Attending: Emergency Medicine | Admitting: Emergency Medicine

## 2015-06-20 DIAGNOSIS — R1032 Left lower quadrant pain: Secondary | ICD-10-CM | POA: Insufficient documentation

## 2015-06-20 DIAGNOSIS — R0600 Dyspnea, unspecified: Secondary | ICD-10-CM | POA: Diagnosis not present

## 2015-06-20 DIAGNOSIS — R079 Chest pain, unspecified: Secondary | ICD-10-CM | POA: Diagnosis not present

## 2015-06-20 DIAGNOSIS — R1011 Right upper quadrant pain: Secondary | ICD-10-CM | POA: Insufficient documentation

## 2015-06-20 DIAGNOSIS — Z87891 Personal history of nicotine dependence: Secondary | ICD-10-CM | POA: Diagnosis not present

## 2015-06-20 DIAGNOSIS — I10 Essential (primary) hypertension: Secondary | ICD-10-CM | POA: Insufficient documentation

## 2015-06-20 DIAGNOSIS — R1030 Lower abdominal pain, unspecified: Secondary | ICD-10-CM

## 2015-06-20 DIAGNOSIS — R1031 Right lower quadrant pain: Secondary | ICD-10-CM | POA: Insufficient documentation

## 2015-06-20 DIAGNOSIS — R350 Frequency of micturition: Secondary | ICD-10-CM | POA: Insufficient documentation

## 2015-06-20 DIAGNOSIS — Z79899 Other long term (current) drug therapy: Secondary | ICD-10-CM | POA: Diagnosis not present

## 2015-06-20 LAB — COMPREHENSIVE METABOLIC PANEL
ALBUMIN: 4 g/dL (ref 3.5–5.0)
ALK PHOS: 46 U/L (ref 38–126)
ALT: 14 U/L (ref 14–54)
ANION GAP: 8 (ref 5–15)
AST: 25 U/L (ref 15–41)
BUN: 24 mg/dL — ABNORMAL HIGH (ref 6–20)
CALCIUM: 8.7 mg/dL — AB (ref 8.9–10.3)
CHLORIDE: 106 mmol/L (ref 101–111)
CO2: 22 mmol/L (ref 22–32)
Creatinine, Ser: 0.83 mg/dL (ref 0.44–1.00)
GFR calc non Af Amer: >60 mL/min (ref >60)
GLUCOSE: 76 mg/dL (ref 65–99)
POTASSIUM: 3.7 mmol/L (ref 3.5–5.1)
SODIUM: 136 mmol/L (ref 135–145)
Total Bilirubin: 0.4 mg/dL (ref 0.3–1.2)
Total Protein: 7.4 g/dL (ref 6.5–8.1)

## 2015-06-20 LAB — CBC
HCT: 27.9 % — ABNORMAL LOW (ref 35.0–47.0)
HEMOGLOBIN: 8.5 g/dL — AB (ref 12.0–16.0)
MCH: 20.5 pg — AB (ref 26.0–34.0)
MCHC: 30.4 g/dL — AB (ref 32.0–36.0)
MCV: 67.4 fL — ABNORMAL LOW (ref 80.0–100.0)
PLATELETS: 340 10*3/uL (ref 150–440)
RBC: 4.14 MIL/uL (ref 3.80–5.20)
RDW: 17.1 % — AB (ref 11.5–14.5)
WBC: 8.8 10*3/uL (ref 3.6–11.0)

## 2015-06-20 LAB — TROPONIN I: Troponin I: <0.03 ng/mL (ref <0.031)

## 2015-06-20 LAB — URINALYSIS COMPLETE WITH MICROSCOPIC (ARMC ONLY)
BILIRUBIN URINE: NEGATIVE
GLUCOSE, UA: NEGATIVE mg/dL
Hgb urine dipstick: NEGATIVE
KETONES UR: NEGATIVE mg/dL
Leukocytes, UA: NEGATIVE
NITRITE: NEGATIVE
PROTEIN: NEGATIVE mg/dL
SPECIFIC GRAVITY, URINE: 1.023 (ref 1.005–1.030)
pH: 5 (ref 5.0–8.0)

## 2015-06-20 MED ORDER — FENTANYL CITRATE (PF) 100 MCG/2ML IJ SOLN
25.0000 ug | Freq: Once | INTRAMUSCULAR | Status: AC
  Administered 2015-06-20: 25 ug via INTRAVENOUS
  Filled 2015-06-20: qty 2

## 2015-06-20 MED ORDER — IOHEXOL 300 MG/ML  SOLN
100.0000 mL | Freq: Once | INTRAMUSCULAR | Status: AC | PRN
  Administered 2015-06-20: 100 mL via INTRAVENOUS

## 2015-06-20 MED ORDER — ONDANSETRON HCL 4 MG/2ML IJ SOLN
4.0000 mg | Freq: Once | INTRAMUSCULAR | Status: AC
  Administered 2015-06-20: 4 mg via INTRAVENOUS
  Filled 2015-06-20: qty 2

## 2015-06-20 MED ORDER — SODIUM CHLORIDE 0.9 % IV BOLUS (SEPSIS)
1000.0000 mL | Freq: Once | INTRAVENOUS | Status: AC
  Administered 2015-06-20: 1000 mL via INTRAVENOUS

## 2015-06-20 MED ORDER — IOHEXOL 240 MG/ML SOLN
50.0000 mL | Freq: Once | INTRAMUSCULAR | Status: AC | PRN
  Administered 2015-06-20: 50 mL via ORAL
  Filled 2015-06-20: qty 50

## 2015-06-20 NOTE — Discharge Instructions (Signed)

## 2015-06-20 NOTE — ED Notes (Signed)
Pt in via triage w/ complaints of lower abdominal pain and pain with urination x 2 days.  Pt reports shortness of breath upon exertion as well as nausea/vomiting.  Pt reports recent hysterectomy 6 weeks ago; pt saw surgeon last Wed with a good follow up.  Pt with hx of kidney stones.  Pt A/Ox4, no immediate distress at this time.

## 2015-06-20 NOTE — ED Notes (Signed)
Patient transported to CT 

## 2015-06-20 NOTE — ED Notes (Signed)
Patient transported to X-ray 

## 2015-06-20 NOTE — ED Notes (Signed)
Pt reports lower abdominal pain with urination; reports shortness of breath upon exertion. Pt reports recent hysterectomy 6 weeks ago. Reports feels bad like this when her hematocrit is low.

## 2015-06-20 NOTE — ED Provider Notes (Signed)
[facility] Emergency Department Provider Note  ____________________________________________  Time seen: Approximately 892 PM  I have reviewed the triage vital signs and the nursing notes.   HISTORY  Chief Complaint Urinary Tract Infection    HPI Elizabeth Flynn is a 45 y.o. female with a history of hypertension as well as a transvaginal hysterectomy, BSO 6 weeks ago who is presenting today with worsening lower abdominal pain. She says the abdominal pain comes and goes without any provoking factors. She says it is sharp and intermittent. She denies any pain at this time. Had one episode of vomiting earlier today. Also reports shortness of breath with exertion as well as nausea and vomiting. Also says that she has left upper chest pain that accompanies the shortness of breath and exertion. Denies any chest pain at this time. Does not report any chest pain with deep breathing.Says the abdominal pain is also radiating to the right back. Also says that he has pain when she sits and urinates. Reports urinary frequency but no burning with urination. Denies any vaginal discharge or bleeding and says that she has not had sexual intercourse since the hysterectomy.   Past Medical History  Diagnosis Date  . Kidney stones   . Anemia   . Ovarian cyst   . Blood transfusion without reported diagnosis 2016    patient has had 25 transfusions throughout her life. her blood levels run chronically low  . Hypertension   . Headache   . MRSA infection 2009  . Enlarged heart   . BRCA negative 10-16-14  . PONV (postoperative nausea and vomiting)     DRY HEAVING AFTER BREAST LUMPECTOMY 10-2014  . Cancer of right breast ([place]) 10/16/2014    Upper and inner quadrant right breast, Invasive ductal carcinoma, Atypical Ductal Hyperplasia, ER/PR Pos, Her 2 Neg  . Diarrhea   . Cardiomyopathy ([place])   . Pancreatitis   . Depression   . Stroke [place])     Sept. 2014 rt sided bleed  . Stroke  ([place])   . GERD (gastroesophageal reflux disease)   . Dysrhythmia     TACHYCARDIA...happens randomly. not seen by any cardiologist  . Shortness of breath dyspnea     with exertion  . Anxiety     takes nothing    Patient Active Problem List   Diagnosis Date Noted  . Status post vaginal hysterectomy 06/12/2015  . S/P total hysterectomy and BSO (bilateral salpingo-oophorectomy) 05/01/2015  . Abnormal uterine bleeding (AUB) 04/29/2015  . Status post D&C 04/11/2015  . Cellulitis of chest wall 01/31/2015  . Blister of right chest wall with infection 01/30/2015  . Cancer of right breast ([place]) 10/16/2014  . Absolute anemia 12/07/2012  . Clinical depression 12/07/2012  . Cerebrovascular accident, old 12/07/2012  . Near syncope 12/07/2012  . BP (high blood pressure) 12/07/2012    Past Surgical History  Procedure Laterality Date  . Cholecystectomy    . Tonsillectomy    . Hernia repair    . Tubal ligation    . Lithotripsy    . Breast lumpectomy with sentinel lymph node biopsy Right 10/23/2014    Procedure: Right breast lumpectomy with sentinel node biopsy ;  Surgeon: Christene Lye, MD;  Location: [facility] ORS;  Service: General;  Laterality: Right;  . Mastectomy modified radical Right 12/05/2014    Procedure: MASTECTOMY MODIFIED RADICAL;  Surgeon: Christene Lye, MD;  Location: [facility] ORS;  Service: General;  Laterality: Right;  . Breast biopsy Right 10/10/2014  Right breast invasive carcinoma. T1A, N0.  . Dilatation & currettage/hysteroscopy with resectocope N/A 03/18/2015    Procedure: DILATATION & CURETTAGE/HYSTEROSCOPY WITH RESECTOCOPE;  Surgeon: Brayton Mars, MD;  Location: [facility] ORS;  Service: Gynecology;  Laterality: N/A;  . Vaginal hysterectomy Bilateral 04/29/2015    Procedure: HYSTERECTOMY VAGINAL/ BILATERAL SALPINGOOPHORECTOMY;  Surgeon: Brayton Mars, MD;  Location: [facility] ORS;  Service: Gynecology;  Laterality: Bilateral;    Current Outpatient Rx  Name   Route  Sig  Dispense  Refill  . acetaminophen (TYLENOL) 500 MG tablet   Oral   Take 2 tablets (1,000 mg total) by mouth every 6 (six) hours as needed. For pain   30 tablet   0   . amLODipine (NORVASC) 10 MG tablet   Oral   Take 1 tablet (10 mg total) by mouth daily. Patient taking differently: Take 10 mg by mouth every morning.    30 tablet   0   . ferrous sulfate 325 (65 FE) MG tablet   Oral   Take 325 mg by mouth daily.         Marland Kitchen FLUoxetine (PROZAC) 10 MG capsule   Oral   Take 10 mg by mouth every morning.          Marland Kitchen ibuprofen (ADVIL,MOTRIN) 800 MG tablet   Oral   Take 1 tablet (800 mg total) by mouth every 8 (eight) hours as needed.   60 tablet   1   . lamoTRIgine (LAMICTAL) 25 MG tablet   Oral   Take 25 mg by mouth every morning.          Marland Kitchen lisinopril (PRINIVIL,ZESTRIL) 20 MG tablet   Oral   Take 1 tablet (20 mg total) by mouth every evening.   30 tablet   0   . promethazine (PHENERGAN) 25 MG tablet      TAKE 1/2-1 TABLET BY MOUTH EVERY 6 HOURS AS NEEDED FOR NAUSEA OR VOMITING      1   . tamoxifen (NOLVADEX) 20 MG tablet      TAKE 1 TABLET (20 MG TOTAL) BY MOUTH DAILY.   30 tablet   6   . vitamin C (ASCORBIC ACID) 500 MG tablet   Oral   Take 500 mg by mouth daily.           Allergies Hydrocodone-acetaminophen; Reglan; Clindamycin/lincomycin; and Morphine and related  Family History  Problem Relation Age of Onset  . Breast cancer Cousin 80  . Heart disease Maternal Grandmother   . Diabetes Paternal Grandmother   . Heart disease Paternal Grandmother   . Ovarian cancer Neg Hx   . Colon cancer Neg Hx     Social History Social History  Substance Use Topics  . Smoking status: Former Smoker    Types: Cigarettes    Quit date: 04/26/1995  . Smokeless tobacco: None     Comment: no passive smoke in home  . Alcohol Use: No     Comment: SOBER FOR 3 [id number])    Review of Systems Constitutional: No fever/chills Eyes: No visual  changes. ENT: No sore throat. Cardiovascular: As above. No chest pain at this time. Respiratory: As above Gastrointestinal:  No diarrhea.  No constipation. Genitourinary: Negative for dysuria. Musculoskeletal: As above Skin: Negative for rash. Neurological: Negative for headaches, focal weakness or numbness.  10-point ROS otherwise negative.  ____________________________________________   PHYSICAL EXAM:  VITAL SIGNS: ED Triage Vitals  Enc Vitals Group     BP 06/20/15 1459 152/93 mmHg  Pulse Rate 06/20/15 1459 118     Resp 06/20/15 1459 24     Temp 06/20/15 1459 98 F (36.7 C)     Temp Source 06/20/15 1459 Oral     SpO2 06/20/15 1459 98 %     Weight 06/20/15 1459 205 lb (92.987 kg)     Height 06/20/15 1459 _0  (1.702 m)     Head Cir --      Peak Flow --      Pain Score 06/20/15 1500 8     Pain Loc --      Pain Edu? --      Excl. in [place]? --     Constitutional: Alert and oriented. Well appearing and in no acute distress. Eyes: Conjunctivae are normal. PERRL. EOMI. Head: Atraumatic. Nose: No congestion/rhinnorhea. Mouth/Throat: Mucous membranes are moist.  Oropharynx non-erythematous. Neck: No stridor.   Cardiovascular: Normal rate, regular rhythm. Grossly normal heart sounds.  Good peripheral circulation. Respiratory: Normal respiratory effort.  No retractions. Lungs CTAB. Gastrointestinal: Soft With mild right upper, right lower, suprapubic and left lower quadrant tenderness. No rebound or guarding. No distention.  No CVA tenderness. Musculoskeletal: No lower extremity tenderness nor edema.  No joint effusions. Neurologic:  Normal speech and language. No gross focal neurologic deficits are appreciated. No gait instability. Skin:  Skin is warm, dry and intact. No rash noted. Psychiatric: Mood and affect are normal. Speech and behavior are normal.  ____________________________________________   LABS (all labs ordered are listed, but only abnormal results are  displayed)  Labs Reviewed  CBC - Abnormal; Notable for the following:    Hemoglobin 8.5 (*)    HCT 27.9 (*)    MCV 67.4 (*)    MCH 20.5 (*)    MCHC 30.4 (*)    RDW 17.1 (*)    All other components within normal limits  COMPREHENSIVE METABOLIC PANEL - Abnormal; Notable for the following:    BUN 24 (*)    Calcium 8.7 (*)    All other components within normal limits  URINALYSIS COMPLETEWITH MICROSCOPIC ([facility] ONLY) - Abnormal; Notable for the following:    Color, Urine YELLOW (*)    APPearance HAZY (*)    Bacteria, UA RARE (*)    Squamous Epithelial / LPF 6-30 (*)    All other components within normal limits  TROPONIN I   ____________________________________________  EKG  ED ECG REPORT I, Doran Stabler, the attending physician, personally viewed and interpreted this ECG.   Date: 06/20/2015  EKG Time: 1732  Rate: 73  Rhythm: normal sinus rhythm  Axis: Normal  Intervals:none  ST&T Change: No ST segment elevation or depression. No abnormal T-wave inversion.  ____________________________________________  RADIOLOGY  IMPRESSION: No acute cardiopulmonary findings.   Electronically Signed By: Marijo Sanes M.D. On: 06/20/2015 18:30      IMPRESSION: No acute abnormality identified in the abdomen and pelvis.  Bilateral parapelvic renal cysts. Nonobstructing stones in both kidneys. No hydronephrosis bilaterally.  Status post prior cholecystectomy and hysterectomy.   Electronically Signed By: Abelardo Diesel M.D. On: 06/20/2015 20:08   ____________________________________________   PROCEDURES   ____________________________________________   INITIAL IMPRESSION / ASSESSMENT AND PLAN / ED COURSE  Pertinent labs & imaging results that were available during my care of the patient were reviewed by me and considered in my medical decision making (see chart for details).  Patient did recently have a surgery but her heart rate is in the 70s in the  room. There is no  pleuritic component to her chest pain. She is more here today for her abdominal pain. I feel that pulmonary embolus is less likely especially being 6 weeks after the procedure.  ----------------------------------------- 8:37 PM on 06/20/2015 -----------------------------------------  Patient is resting comfortably at this time. She has had a very benign workup here in the emergency department. I reviewed both the labs as well as imaging results with her. She said that she has had shortness of breath and chest pain similar to the symptoms today in the past and says that this has been related to her anemia. She is not at a level [place] transfuse her at this time. Unclear the cause of her abdominal pain, however the CAT scan is very reassuring. She will be following up with her surgeon, Dr. Enzo Bi. Heart rate is still no 70s. No active chest pain at this time. ____________________________________________   FINAL CLINICAL IMPRESSION(S) / ED DIAGNOSES  Chest pain with shortness of breath. Abdominal pain.    Orbie Pyo, MD 06/20/15 2038

## 2015-07-29 IMAGING — CT CT ABD-PELV W/O CM
1 of 2 series · 15 of 32 positions shown, 19 images · non-contrast
Comparison: None.

CLINICAL DATA: Sudden onset right lower quadrant pain extending to
the back.

EXAM:
CT ABDOMEN AND PELVIS WITHOUT CONTRAST
TECHNIQUE: Multidetector CT imaging of the abdomen and pelvis was performed
following the standard protocol without IV contrast.

[Series 2: stone standard full · axial · 0.79mm/px · z∈[-1098,-613]mm · 15 of 107 slices shown, 19 images]
[im 5/107  soft-tissue]
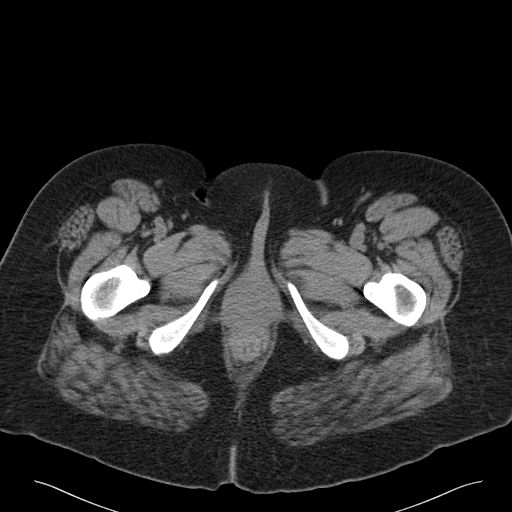
[im 5/107  bone]
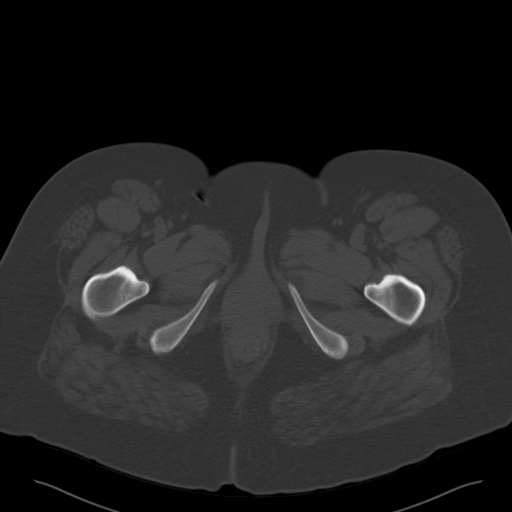
[im 14/107  soft-tissue]
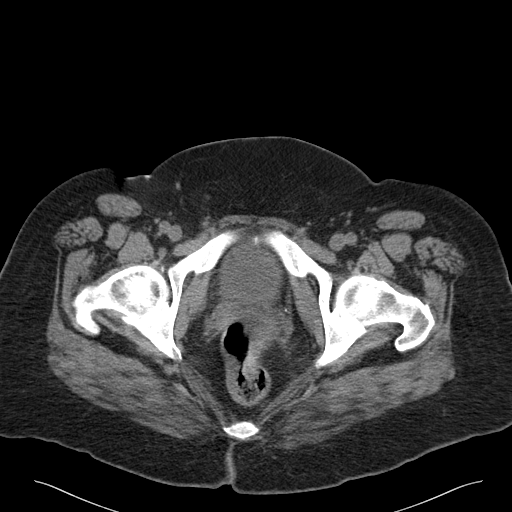
[im 24/107  soft-tissue]
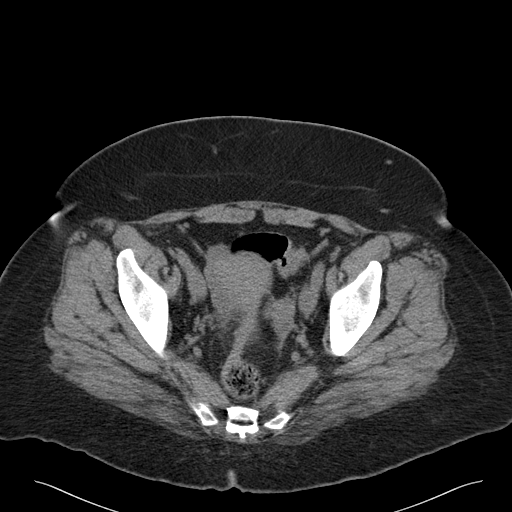
[im 28/107  soft-tissue]
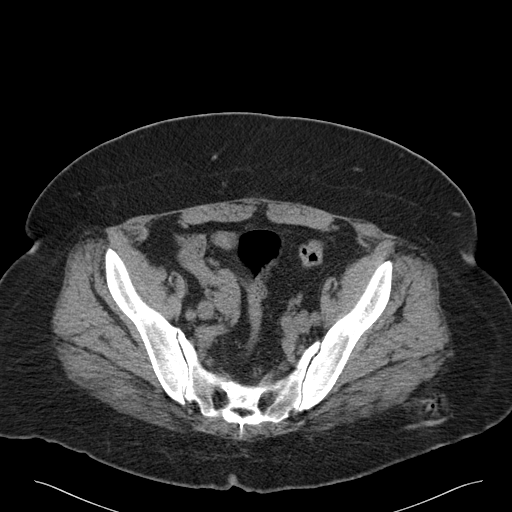
[im 37/107  soft-tissue]
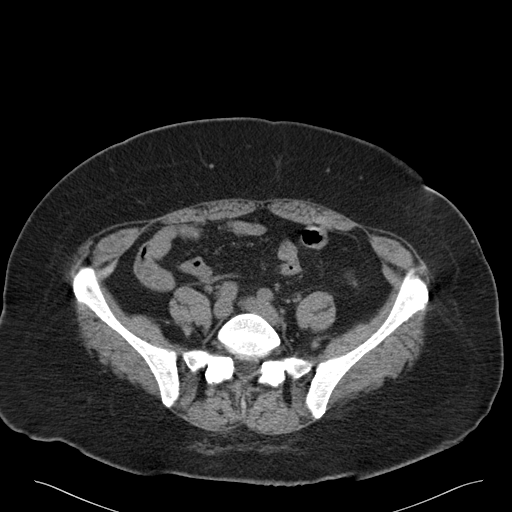
[im 47/107  soft-tissue]
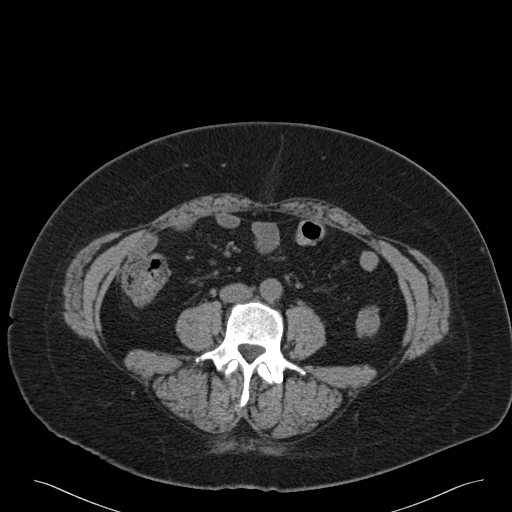
[im 56/107  soft-tissue]
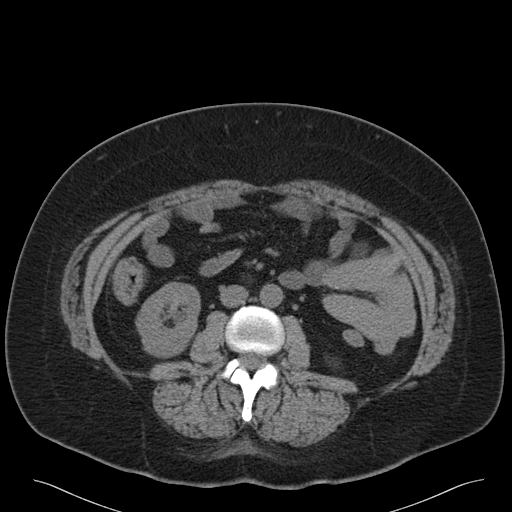
[im 60/107  soft-tissue]
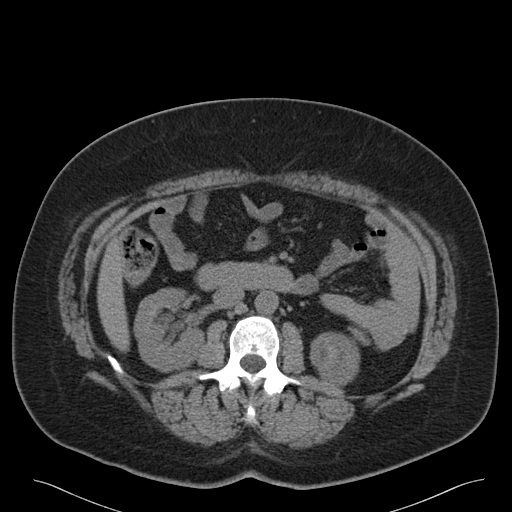
[im 70/107  soft-tissue]
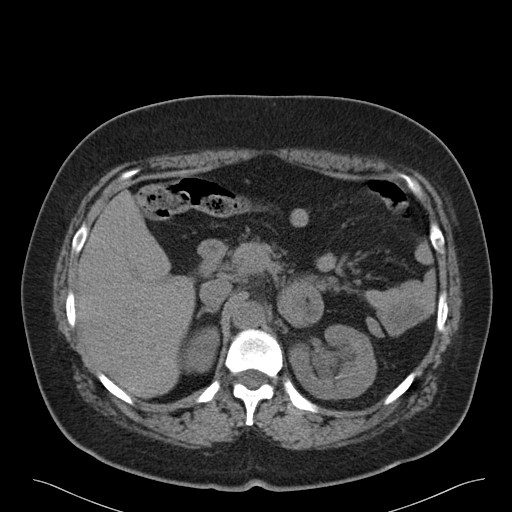
[im 70/107  bone]
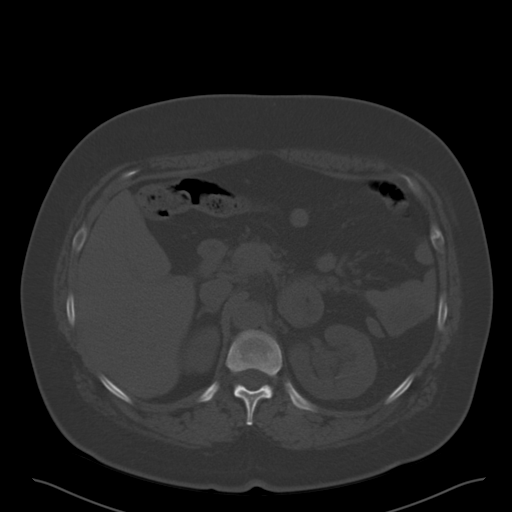
[im 79/107  soft-tissue]
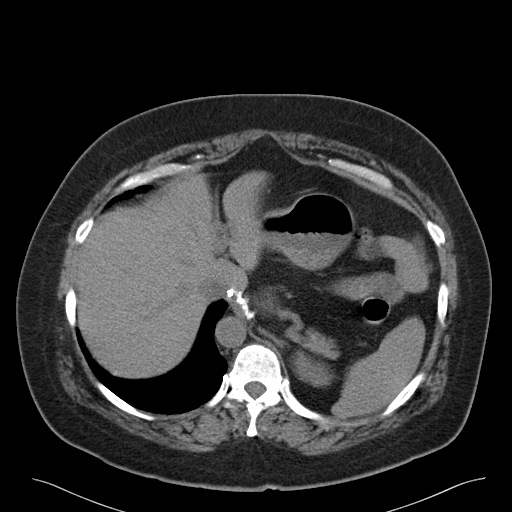
[im 83/107  soft-tissue]
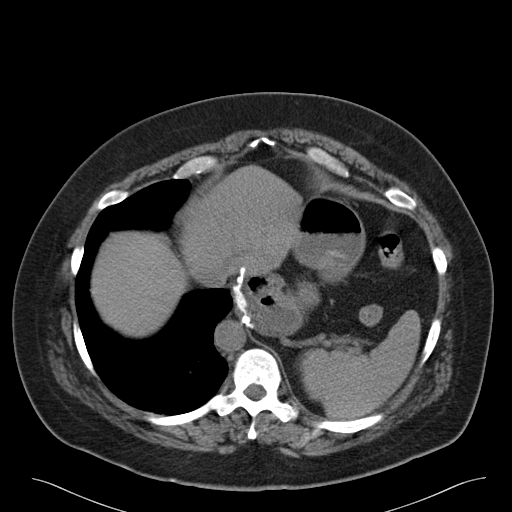
[im 88/107  lung]
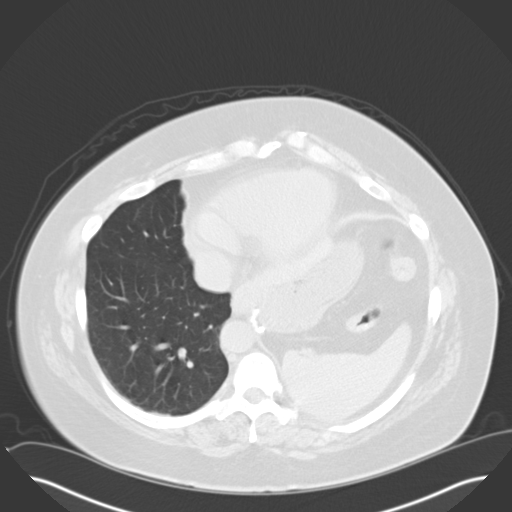
[im 93/107  soft-tissue]
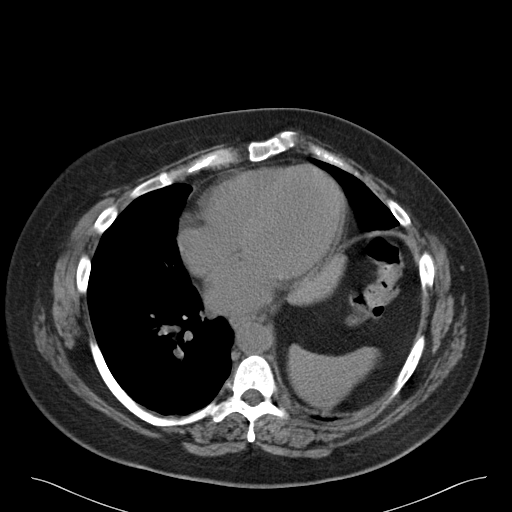
[im 93/107  lung]
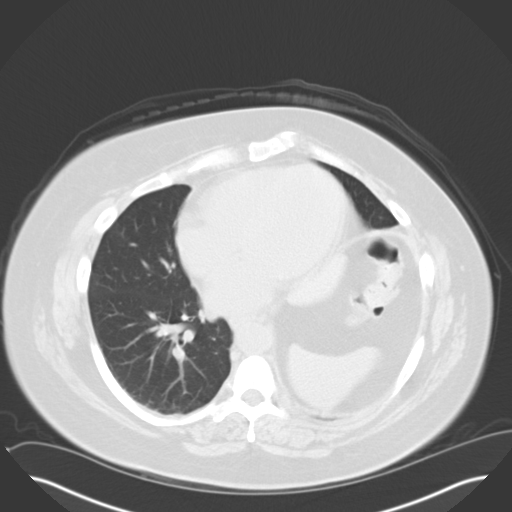
[im 97/107  lung]
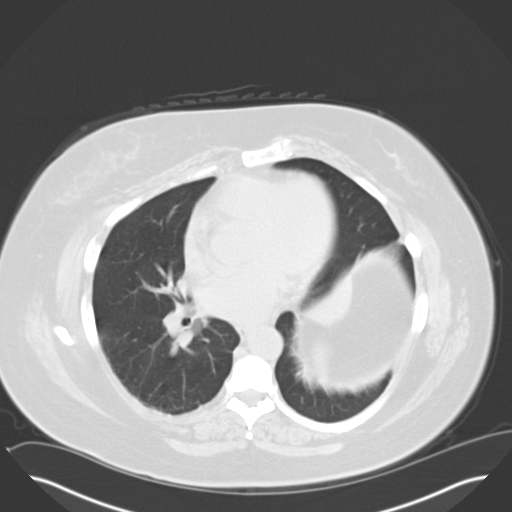
[im 102/107  soft-tissue]
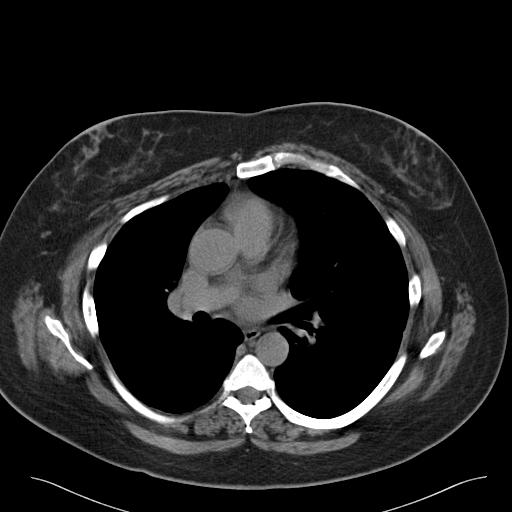
[im 102/107  lung]
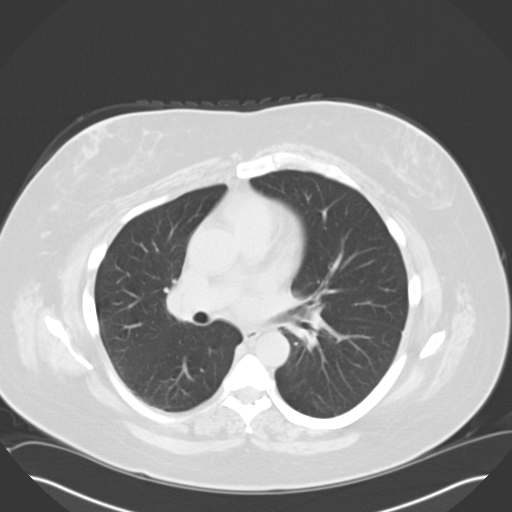

[15 of 32 positions shown; findings below may reference images not displayed]

FINDINGS: The left hemidiaphragm is chronically elevated. The heart size is
within normal limits. No significant pleural or pericardial effusion
is present. Surgical changes are again noted at the GE junction.
Stomach, duodenum, and pancreas are otherwise within normal limits.
Common bile duct is within normal limits following cholecystectomy.
The adrenal glands are normal bilaterally. A 6.5 mm stone near the
lower pole right kidney is stable. A punctate stone is present more
superiorly in the right kidney, not previously seen. Parapelvic
cysts of the left kidney are again noted. There are no significant
stones in the left kidney. The ureters and urinary bladder are
within normal limits.

The rectosigmoid colon is within normal limits. Remainder the colon
is unremarkable. The appendix is visualized and normal. The small
bowel is unremarkable. There is no significant adenopathy or free
fluid. Uterus and adnexa are within normal limits. Prominent soft
tissue is noted at the level of the cervix as before.

The bone windows are unremarkable.
IMPRESSION: 1. No acute or focal lesion to explain the patient's symptoms. The
appendix is normal.
2. Postsurgical changes at the GE junction.
3. Chronic elevation of left hemidiaphragm.
4. Left-sided parapelvic renal cyst.
5. Stable nonobstructing stone at the lower pole the right kidney.
6. Prominent soft tissue at the level the cervix. Please correlate
with physical exam.

## 2015-07-31 ENCOUNTER — Other Ambulatory Visit: Payer: Self-pay

## 2015-07-31 DIAGNOSIS — Z1231 Encounter for screening mammogram for malignant neoplasm of breast: Secondary | ICD-10-CM

## 2015-08-23 ENCOUNTER — Emergency Department
Admission: EM | Admit: 2015-08-23 | Discharge: 2015-08-23 | Disposition: A | Discharge status: Home / Self Care | Payer: Medicaid Other | Attending: Emergency Medicine | Admitting: Emergency Medicine

## 2015-08-23 DIAGNOSIS — Z79899 Other long term (current) drug therapy: Secondary | ICD-10-CM | POA: Insufficient documentation

## 2015-08-23 DIAGNOSIS — Z87891 Personal history of nicotine dependence: Secondary | ICD-10-CM | POA: Diagnosis not present

## 2015-08-23 DIAGNOSIS — I10 Essential (primary) hypertension: Secondary | ICD-10-CM | POA: Insufficient documentation

## 2015-08-23 DIAGNOSIS — Z8673 Personal history of transient ischemic attack (TIA), and cerebral infarction without residual deficits: Secondary | ICD-10-CM | POA: Insufficient documentation

## 2015-08-23 DIAGNOSIS — F329 Major depressive disorder, single episode, unspecified: Secondary | ICD-10-CM | POA: Diagnosis not present

## 2015-08-23 DIAGNOSIS — K625 Hemorrhage of anus and rectum: Secondary | ICD-10-CM

## 2015-08-23 LAB — COMPREHENSIVE METABOLIC PANEL
ALK PHOS: 48 U/L (ref 38–126)
ALT: 12 U/L — AB (ref 14–54)
AST: 20 U/L (ref 15–41)
Albumin: 3.8 g/dL (ref 3.5–5.0)
Anion gap: 5 (ref 5–15)
BILIRUBIN TOTAL: 0.4 mg/dL (ref 0.3–1.2)
BUN: 15 mg/dL (ref 6–20)
CALCIUM: 9 mg/dL (ref 8.9–10.3)
CO2: 25 mmol/L (ref 22–32)
CREATININE: 0.67 mg/dL (ref 0.44–1.00)
Chloride: 108 mmol/L (ref 101–111)
GFR calc non Af Amer: >60 mL/min (ref >60)
Glucose, Bld: 82 mg/dL (ref 65–99)
Potassium: 4.3 mmol/L (ref 3.5–5.1)
SODIUM: 138 mmol/L (ref 135–145)
Total Protein: 6.6 g/dL (ref 6.5–8.1)

## 2015-08-23 LAB — CBC
HCT: 27.6 % — ABNORMAL LOW (ref 35.0–47.0)
Hemoglobin: 8.3 g/dL — ABNORMAL LOW (ref 12.0–16.0)
MCH: 19.8 pg — AB (ref 26.0–34.0)
MCHC: 30.2 g/dL — AB (ref 32.0–36.0)
MCV: 65.6 fL — ABNORMAL LOW (ref 80.0–100.0)
PLATELETS: 237 10*3/uL (ref 150–440)
RBC: 4.2 MIL/uL (ref 3.80–5.20)
RDW: 17.6 % — ABNORMAL HIGH (ref 11.5–14.5)
WBC: 7.9 10*3/uL (ref 3.6–11.0)

## 2015-08-23 LAB — PROTIME-INR
INR: 1.08
PROTHROMBIN TIME: 14.2 s (ref 11.4–15.0)

## 2015-08-23 MED ORDER — OXYCODONE-ACETAMINOPHEN 5-325 MG PO TABS: 2.0000 | ORAL_TABLET | Freq: Four times a day (QID) | ORAL | Status: DC | PRN

## 2015-08-23 MED ORDER — HYDROCORTISONE ACETATE 25 MG RE SUPP: 25.0000 mg | Freq: Two times a day (BID) | RECTAL | Status: DC

## 2015-08-23 MED ORDER — OXYCODONE-ACETAMINOPHEN 5-325 MG PO TABS
2.0000 | ORAL_TABLET | Freq: Once | ORAL | Status: AC
  Administered 2015-08-23: 2 via ORAL
  Filled 2015-08-23: qty 2

## 2015-08-23 NOTE — ED Notes (Signed)
MD Williams at bedside.  

## 2015-08-23 NOTE — ED Provider Notes (Signed)
[facility] Emergency Department Provider Note        Time seen: ----------------------------------------- 9:41 AM on 08/23/2015 -----------------------------------------    I have reviewed the triage vital signs and the nursing notes.   HISTORY  Chief Complaint Rectal Bleeding    HPI Elizabeth Flynn is a 45 y.o. female who presents ER for upper abdominal pain for "a while". She states she started noticing bright red blood that she has passed intermittently for the last 3 days. Patient is also had blood mixed in with the stool. Patient does not feel like she's had significant blood loss, does not think she lost more than a cup of blood. States Giardia is anemia but feels more short of breath and weak than normal. Nothing makes her symptoms better.   Past Medical History  Diagnosis Date  . Kidney stones   . Anemia   . Ovarian cyst   . Blood transfusion without reported diagnosis 2016    patient has had 25 transfusions throughout her life. her blood levels run chronically low  . Hypertension   . Headache   . MRSA infection 2009  . Enlarged heart   . BRCA negative 10-16-14  . PONV (postoperative nausea and vomiting)     DRY HEAVING AFTER BREAST LUMPECTOMY 10-2014  . Cancer of right breast ([place]) 10/16/2014    Upper and inner quadrant right breast, Invasive ductal carcinoma, Atypical Ductal Hyperplasia, ER/PR Pos, Her 2 Neg  . Diarrhea   . Cardiomyopathy ([place])   . Pancreatitis   . Depression   . Stroke [facility])     Sept. 2014 rt sided bleed  . Stroke ([place])   . GERD (gastroesophageal reflux disease)   . Dysrhythmia     TACHYCARDIA...happens randomly. not seen by any cardiologist  . Shortness of breath dyspnea     with exertion  . Anxiety     takes nothing    Patient Active Problem List   Diagnosis Date Noted  . Status post vaginal hysterectomy 06/12/2015  . S/P total hysterectomy and BSO (bilateral salpingo-oophorectomy) 05/01/2015  .  Abnormal uterine bleeding (AUB) 04/29/2015  . Status post D&C 04/11/2015  . Cellulitis of chest wall 01/31/2015  . Blister of right chest wall with infection 01/30/2015  . Cancer of right breast ([place]) 10/16/2014  . Absolute anemia 12/07/2012  . Clinical depression 12/07/2012  . Cerebrovascular accident, old 12/07/2012  . Near syncope 12/07/2012  . BP (high blood pressure) 12/07/2012    Past Surgical History  Procedure Laterality Date  . Cholecystectomy    . Tonsillectomy    . Hernia repair    . Tubal ligation    . Lithotripsy    . Breast lumpectomy with sentinel lymph node biopsy Right 10/23/2014    Procedure: Right breast lumpectomy with sentinel node biopsy ;  Surgeon: Christene Lye, MD;  Location: [facility] ORS;  Service: General;  Laterality: Right;  . Mastectomy modified radical Right 12/05/2014    Procedure: MASTECTOMY MODIFIED RADICAL;  Surgeon: Christene Lye, MD;  Location: [facility] ORS;  Service: General;  Laterality: Right;  . Breast biopsy Right 10/10/2014    Right breast invasive carcinoma. T1A, N0.  . Dilatation & currettage/hysteroscopy with resectocope N/A 03/18/2015    Procedure: DILATATION & CURETTAGE/HYSTEROSCOPY WITH RESECTOCOPE;  Surgeon: Brayton Mars, MD;  Location: [facility] ORS;  Service: Gynecology;  Laterality: N/A;  . Vaginal hysterectomy Bilateral 04/29/2015    Procedure: HYSTERECTOMY VAGINAL/ BILATERAL SALPINGOOPHORECTOMY;  Surgeon: Brayton Mars, MD;  Location[facility]  ORS;  Service: Gynecology;  Laterality: Bilateral;    Allergies Hydrocodone-acetaminophen; Reglan; Clindamycin/lincomycin; and Morphine and related  Social History Social History  Substance Use Topics  . Smoking status: Former Smoker    Types: Cigarettes    Quit date: 04/26/1995  . Smokeless tobacco: Not on file     Comment: no passive smoke in home  . Alcohol Use: No     Comment: SOBER FOR 3 [id number])    Review of Systems Constitutional: Negative for fever. Eyes:  Negative for visual changes. ENT: Negative for sore throat. Cardiovascular: Negative for chest pain. Respiratory: Positive for shortness of breath Gastrointestinal: Negative for abdominal pain, vomiting and diarrhea. Positive for rectal bleeding Genitourinary: Negative for dysuria. Musculoskeletal: Negative for back pain. Skin: Negative for rash. Neurological: Negative for headaches, positive for weakness  10-point ROS otherwise negative.  ____________________________________________   PHYSICAL EXAM:  VITAL SIGNS: ED Triage Vitals  Enc Vitals Group     BP 08/23/15 0910 120/89 mmHg     Pulse Rate 08/23/15 0910 83     Resp 08/23/15 0910 16     Temp 08/23/15 0910 98.1 F (36.7 C)     Temp Source 08/23/15 0910 Oral     SpO2 08/23/15 0910 99 %     Weight 08/23/15 0910 197 lb (89.359 kg)     Height 08/23/15 0910 5' 7"  (1.702 m)     Head Cir --      Peak Flow --      Pain Score 08/23/15 0910 7     Pain Loc --      Pain Edu? --      Excl. in [place]? --     Constitutional: Alert and oriented. Well appearing and in no distress. Eyes: Conjunctivae are Pale. PERRL. Normal extraocular movements. ENT   Head: Normocephalic and atraumatic.   Nose: No congestion/rhinnorhea.   Mouth/Throat: Mucous membranes are moist.   Neck: No stridor. Cardiovascular: Normal rate, regular rhythm. No murmurs, rubs, or gallops. Respiratory: Normal respiratory effort without tachypnea nor retractions. Breath sounds are clear and equal bilaterally. No wheezes/rales/rhonchi. Gastrointestinal: Soft and nontender. Normal bowel sounds Rectal: No hemorrhoids, heme-negative stool Musculoskeletal: Nontender with normal range of motion in all extremities. No lower extremity tenderness nor edema. Neurologic:  Normal speech and language. No gross focal neurologic deficits are appreciated.  Skin:  Skin is warm, dry and intact. Pallor is noted Psychiatric: Mood and affect are normal. Speech and behavior  are normal.   ____________________________________________  ED COURSE:  Pertinent labs & imaging results that were available during my care of the patient were reviewed by me and considered in my medical decision making (see chart for details). Patient presents with weakness, rectal bleeding and possible anemia. We will check basic labs and reevaluate. ____________________________________________    LABS (pertinent positives/negatives)  Labs Reviewed  CBC - Abnormal; Notable for the following:    Hemoglobin 8.3 (*)    HCT 27.6 (*)    MCV 65.6 (*)    MCH 19.8 (*)    MCHC 30.2 (*)    RDW 17.6 (*)    All other components within normal limits  PROTIME-INR  COMPREHENSIVE METABOLIC PANEL  TYPE AND SCREEN   CT scan of the abdomen and pelvis with contrast was reviewed from 2 months ago which was normal ____________________________________________  FINAL ASSESSMENT AND PLAN  Rectal bleeding  Plan: Patient with labs and imaging as dictated above. Unclear etiology for rectal bleeding. I will prescribe some Anusol suppositories and  refer her to GI for follow-up. She is heme-negative here with stable anemia.   Earleen Newport, MD   Note: This dictation was prepared with Dragon dictation. Any transcriptional errors that result from this process are unintentional   Earleen Newport, MD 08/23/15 1230

## 2015-08-23 NOTE — ED Notes (Signed)
Pt reports a lot of upper abd pain for a while but started noticing bright red blood from her rectum for the past 3 days. Pt states that she already has anemia

## 2015-08-23 NOTE — Discharge Instructions (Signed)

## 2015-09-26 ENCOUNTER — Inpatient Hospital Stay: Payer: Medicaid Other | Attending: Family Medicine | Admitting: Family Medicine

## 2015-09-26 ENCOUNTER — Inpatient Hospital Stay: Payer: Medicaid Other

## 2015-09-26 VITALS — BP 151/98 | HR 80 | Temp 98.8°F | Wt 205.0 lb

## 2015-09-26 DIAGNOSIS — N83209 Unspecified ovarian cyst, unspecified side: Secondary | ICD-10-CM | POA: Insufficient documentation

## 2015-09-26 DIAGNOSIS — Z8614 Personal history of Methicillin resistant Staphylococcus aureus infection: Secondary | ICD-10-CM | POA: Insufficient documentation

## 2015-09-26 DIAGNOSIS — Z9011 Acquired absence of right breast and nipple: Secondary | ICD-10-CM | POA: Insufficient documentation

## 2015-09-26 DIAGNOSIS — Z79899 Other long term (current) drug therapy: Secondary | ICD-10-CM | POA: Insufficient documentation

## 2015-09-26 DIAGNOSIS — G4700 Insomnia, unspecified: Secondary | ICD-10-CM | POA: Diagnosis not present

## 2015-09-26 DIAGNOSIS — D509 Iron deficiency anemia, unspecified: Secondary | ICD-10-CM | POA: Insufficient documentation

## 2015-09-26 DIAGNOSIS — Z87820 Personal history of traumatic brain injury: Secondary | ICD-10-CM | POA: Insufficient documentation

## 2015-09-26 DIAGNOSIS — M25511 Pain in right shoulder: Secondary | ICD-10-CM | POA: Insufficient documentation

## 2015-09-26 DIAGNOSIS — Z170 Estrogen receptor positive status [ER+]: Secondary | ICD-10-CM

## 2015-09-26 DIAGNOSIS — C50211 Malignant neoplasm of upper-inner quadrant of right female breast: Secondary | ICD-10-CM | POA: Insufficient documentation

## 2015-09-26 DIAGNOSIS — D508 Other iron deficiency anemias: Secondary | ICD-10-CM

## 2015-09-26 DIAGNOSIS — K859 Acute pancreatitis without necrosis or infection, unspecified: Secondary | ICD-10-CM | POA: Diagnosis not present

## 2015-09-26 DIAGNOSIS — I10 Essential (primary) hypertension: Secondary | ICD-10-CM | POA: Diagnosis not present

## 2015-09-26 DIAGNOSIS — C50911 Malignant neoplasm of unspecified site of right female breast: Secondary | ICD-10-CM

## 2015-09-26 DIAGNOSIS — Z79810 Long term (current) use of selective estrogen receptor modulators (SERMs): Secondary | ICD-10-CM | POA: Diagnosis not present

## 2015-09-26 LAB — CBC WITH DIFFERENTIAL/PLATELET
BASOS ABS: 0.1 10*3/uL (ref 0–0.1)
Basophils Relative: 1 %
EOS ABS: 0.1 10*3/uL (ref 0–0.7)
EOS PCT: 2 %
HCT: 31 % — ABNORMAL LOW (ref 35.0–47.0)
Hemoglobin: 9.5 g/dL — ABNORMAL LOW (ref 12.0–16.0)
Lymphocytes Relative: 31 %
Lymphs Abs: 2.4 10*3/uL (ref 1.0–3.6)
MCH: 19.6 pg — ABNORMAL LOW (ref 26.0–34.0)
MCHC: 30.5 g/dL — ABNORMAL LOW (ref 32.0–36.0)
MCV: 64.2 fL — ABNORMAL LOW (ref 80.0–100.0)
MONO ABS: 0.6 10*3/uL (ref 0.2–0.9)
Monocytes Relative: 8 %
Neutro Abs: 4.7 10*3/uL (ref 1.4–6.5)
Neutrophils Relative %: 58 %
PLATELETS: 366 10*3/uL (ref 150–440)
RBC: 4.84 MIL/uL (ref 3.80–5.20)
RDW: 17.6 % — AB (ref 11.5–14.5)
WBC: 7.9 10*3/uL (ref 3.6–11.0)

## 2015-09-26 LAB — COMPREHENSIVE METABOLIC PANEL
ALT: 18 U/L (ref 14–54)
AST: 25 U/L (ref 15–41)
Albumin: 4.3 g/dL (ref 3.5–5.0)
Alkaline Phosphatase: 70 U/L (ref 38–126)
Anion gap: 8 (ref 5–15)
BUN: 16 mg/dL (ref 6–20)
CHLORIDE: 102 mmol/L (ref 101–111)
CO2: 24 mmol/L (ref 22–32)
Calcium: 8.9 mg/dL (ref 8.9–10.3)
Creatinine, Ser: 0.78 mg/dL (ref 0.44–1.00)
Glucose, Bld: 103 mg/dL — ABNORMAL HIGH (ref 65–99)
POTASSIUM: 3.9 mmol/L (ref 3.5–5.1)
SODIUM: 134 mmol/L — AB (ref 135–145)
TOTAL PROTEIN: 7.7 g/dL (ref 6.5–8.1)
Total Bilirubin: 0.3 mg/dL (ref 0.3–1.2)

## 2015-09-26 LAB — FERRITIN: FERRITIN: 4 ng/mL — AB (ref 11–307)

## 2015-09-26 LAB — IRON AND TIBC
IRON: 10 ug/dL — AB (ref 28–170)
SATURATION RATIOS: 2 % — AB (ref 10.4–31.8)
TIBC: 660 ug/dL — AB (ref 250–450)
UIBC: 650 ug/dL

## 2015-09-26 MED ORDER — MELOXICAM 7.5 MG PO TABS: 7.5000 mg | ORAL_TABLET | Freq: Every day | ORAL | Status: DC

## 2015-09-26 MED ORDER — ZOLPIDEM TARTRATE 5 MG PO TABS: 5.0000 mg | ORAL_TABLET | Freq: Every evening | ORAL | Status: AC | PRN

## 2015-09-26 NOTE — Progress Notes (Signed)
Patient reports that she has pain in her right chest wall and shoulder.  She also states that she is having trouble sleeping.

## 2015-09-26 NOTE — Progress Notes (Signed)
[place]  [phone]  [phone]     Nyla Creason DOB: [birth year]  MR#: [id number]  XNA#:[id number]  Patient Care Team: Lavonne Chick, MD as PCP - General (Family Medicine) Rico Junker, RN as Registered Nurse (Radiology) Theodore Demark, RN as Registered Nurse Seeplaputhur Robinette Haines, MD (General Surgery)  CHIEF COMPLAINT:  Chief Complaint  Patient presents with  . Cancer of right breast    INTERVAL HISTORY:  Patient is here for further follow-up regarding carcinoma of the right breast. Patient has multiple medical complaints including pain in her right shoulder that extends into the incisional area of her mastectomy, also complains of myalgias in the bilateral lower extremities. She also reports having high anxiety and difficulty sleeping. She has a primary care provider Garen Grams, nurse practitioner who sees her and manages her psychiatric medications. Patient also continues to have chronic anemia. Hemoglobin of 9.5, with a very low MCV of 64. Patient reports having been in the emergency room multiple times over the last several months for dehydration, rectal pain and bleeding. Patient is currently taking tamoxifen. Since her last visit in December 2016 she has had a hysterectomy with Dr. Enzo Bi. She has been lost to follow-up for him and according to the patient. Her most recent mammogram was July 2016.  REVIEW OF SYSTEMS:   Review of Systems  Constitutional: Positive for malaise/fatigue. Negative for fever, chills, weight loss and diaphoresis.  HENT: Negative.   Eyes: Negative.   Respiratory: Negative for cough, hemoptysis, sputum production, shortness of breath and wheezing.   Cardiovascular: Negative for chest pain, palpitations, orthopnea, claudication, leg swelling and PND.  Gastrointestinal: Positive for blood in stool. Negative for heartburn, nausea, vomiting, abdominal pain, diarrhea, constipation and melena.       Rectal  pain  Genitourinary: Negative.   Musculoskeletal: Positive for myalgias.       Lower extremity pain 2 weeks Right shoulder/mastectomy site pain  Skin: Negative.   Neurological: Negative for dizziness, tingling, focal weakness, seizures and weakness.  Endo/Heme/Allergies: Does not bruise/bleed easily.  Psychiatric/Behavioral: Negative for depression. The patient is nervous/anxious and has insomnia.     As per HPI. Otherwise, a complete review of systems is negatve.  ONCOLOGY HISTORY: Oncology History   1.  Abnormal mammogram with calcification in the right breast.  Biopsy on October 10, 2014 was positive for invasive carcinoma 2.  Status post lumpectomy and axillary lymph node evaluation 3.  My risk genetic study (July of 2016) negative.  No clinically significant mutation identified     Cancer of right breast ([place])   10/16/2014 Initial Diagnosis Cancer of right breast    PAST MEDICAL HISTORY: Past Medical History  Diagnosis Date  . Kidney stones   . Anemia   . Ovarian cyst   . Blood transfusion without reported diagnosis 2016    patient has had 25 transfusions throughout her life. her blood levels run chronically low  . Hypertension   . Headache   . MRSA infection 2009  . Enlarged heart   . BRCA negative 10-16-14  . PONV (postoperative nausea and vomiting)     DRY HEAVING AFTER BREAST LUMPECTOMY 10-2014  . Cancer of right breast ([place]) 10/16/2014    Upper and inner quadrant right breast, Invasive ductal carcinoma, Atypical Ductal Hyperplasia, ER/PR Pos, Her 2 Neg  . Diarrhea   . Cardiomyopathy ([place])   . Pancreatitis   . Depression   . Stroke [facility])     Sept. 2014  rt sided bleed  . Stroke ([place])   . GERD (gastroesophageal reflux disease)   . Dysrhythmia     TACHYCARDIA...happens randomly. not seen by any cardiologist  . Shortness of breath dyspnea     with exertion  . Anxiety     takes nothing    PAST SURGICAL HISTORY: Past Surgical History  Procedure Laterality Date    . Cholecystectomy    . Tonsillectomy    . Hernia repair    . Tubal ligation    . Lithotripsy    . Breast lumpectomy with sentinel lymph node biopsy Right 10/23/2014    Procedure: Right breast lumpectomy with sentinel node biopsy ;  Surgeon: Christene Lye, MD;  Location: [facility] ORS;  Service: General;  Laterality: Right;  . Mastectomy modified radical Right 12/05/2014    Procedure: MASTECTOMY MODIFIED RADICAL;  Surgeon: Christene Lye, MD;  Location: [facility] ORS;  Service: General;  Laterality: Right;  . Breast biopsy Right 10/10/2014    Right breast invasive carcinoma. T1A, N0.  . Dilatation & currettage/hysteroscopy with resectocope N/A 03/18/2015    Procedure: DILATATION & CURETTAGE/HYSTEROSCOPY WITH RESECTOCOPE;  Surgeon: Brayton Mars, MD;  Location: [facility] ORS;  Service: Gynecology;  Laterality: N/A;  . Vaginal hysterectomy Bilateral 04/29/2015    Procedure: HYSTERECTOMY VAGINAL/ BILATERAL SALPINGOOPHORECTOMY;  Surgeon: Brayton Mars, MD;  Location: [facility] ORS;  Service: Gynecology;  Laterality: Bilateral;    FAMILY HISTORY Family History  Problem Relation Age of Onset  . Breast cancer Cousin 52  . Heart disease Maternal Grandmother   . Diabetes Paternal Grandmother   . Heart disease Paternal Grandmother   . Ovarian cancer Neg Hx   . Colon cancer Neg Hx     GYNECOLOGIC HISTORY:  Patient's last menstrual period was 03/26/2015.     ADVANCED DIRECTIVES:    HEALTH MAINTENANCE: Social History  Substance Use Topics  . Smoking status: Former Smoker    Types: Cigarettes    Quit date: 04/26/1995  . Smokeless tobacco: Not on file     Comment: no passive smoke in home  . Alcohol Use: No     Comment: SOBER FOR 3 [id number])       Allergies  Allergen Reactions  . Hydrocodone-Acetaminophen Hives and Itching  . Reglan [Metoclopramide] Other (See Comments)    "freaks me out, makes me nervous"  . Clindamycin/Lincomycin Rash  . Morphine And Related Rash     Current Outpatient Prescriptions  Medication Sig Dispense Refill  . acetaminophen (TYLENOL) 500 MG tablet Take 2 tablets (1,000 mg total) by mouth every 6 (six) hours as needed. For pain 30 tablet 0  . amLODipine (NORVASC) 10 MG tablet Take 1 tablet (10 mg total) by mouth daily. (Patient taking differently: Take 10 mg by mouth every morning. ) 30 tablet 0  . ferrous sulfate 325 (65 FE) MG tablet Take 325 mg by mouth daily.    Marland Kitchen FLUoxetine (PROZAC) 20 MG capsule TAKE ONE TABLET BY MOUTH DAILY FOR DEPRESSION  5  . lamoTRIgine (LAMICTAL) 25 MG tablet Take 25 mg by mouth every morning.     Marland Kitchen lisinopril (PRINIVIL,ZESTRIL) 20 MG tablet Take 1 tablet (20 mg total) by mouth every evening. 30 tablet 0  . tamoxifen (NOLVADEX) 20 MG tablet TAKE 1 TABLET (20 MG TOTAL) BY MOUTH DAILY. 30 tablet 6  . vitamin C (ASCORBIC ACID) 500 MG tablet Take 500 mg by mouth daily.    . meloxicam (MOBIC) 7.5 MG tablet Take 1 tablet (7.5 mg total)  by mouth daily. 15 tablet 0  . zolpidem (AMBIEN) 5 MG tablet Take 1 tablet (5 mg total) by mouth at bedtime as needed for sleep. 15 tablet 0   No current facility-administered medications for this visit.    OBJECTIVE: BP 151/98 mmHg  Pulse 80  Temp(Src) 98.8 F (37.1 C)  Wt 205 lb (92.987 kg)  LMP 03/26/2015   Body mass index is 32.1 kg/(m^2).    ECOG FS:1 - Symptomatic but completely ambulatory  General: Well-developed, well-nourished, no acute distress. Eyes: Pink conjunctiva, anicteric sclera. HEENT: Normocephalic, moist mucous membranes, clear oropharnyx. Lungs: Clear to auscultation bilaterally. Heart: Regular rate and rhythm. No rubs, murmurs, or gallops. Abdomen: Soft, nontender, nondistended. No organomegaly noted, normoactive bowel sounds. Breast: Right mastectomy with tenderness near the sternum. Left breast palpated in a circular manner in the sitting and supine positions.  No masses or fullness palpated.  Axilla palpated in both positions with no masses  or fullness palpated.  Musculoskeletal: No edema, cyanosis, or clubbing. Neuro: Alert, answering all questions appropriately. Cranial nerves grossly intact. Skin: No rashes or petechiae noted. Psych: Normal affect. Lymphatics: No cervical, clavicular, or axillary LAD.   LAB RESULTS:  Appointment on 09/26/2015  Component Date Value Ref Range Status  . WBC 09/26/2015 7.9  3.6 - 11.0 K/uL Final  . RBC 09/26/2015 4.84  3.80 - 5.20 MIL/uL Final  . Hemoglobin 09/26/2015 9.5* 12.0 - 16.0 g/dL Final  . HCT 09/26/2015 31.0* 35.0 - 47.0 % Final  . MCV 09/26/2015 64.2* 80.0 - 100.0 fL Final  . MCH 09/26/2015 19.6* 26.0 - 34.0 pg Final  . MCHC 09/26/2015 30.5* 32.0 - 36.0 g/dL Final  . RDW 09/26/2015 17.6* 11.5 - 14.5 % Final  . Platelets 09/26/2015 366  150 - 440 K/uL Final  . Neutrophils Relative % 09/26/2015 58   Final  . Neutro Abs 09/26/2015 4.7  1.4 - 6.5 K/uL Final  . Lymphocytes Relative 09/26/2015 31   Final  . Lymphs Abs 09/26/2015 2.4  1.0 - 3.6 K/uL Final  . Monocytes Relative 09/26/2015 8   Final  . Monocytes Absolute 09/26/2015 0.6  0.2 - 0.9 K/uL Final  . Eosinophils Relative 09/26/2015 2   Final  . Eosinophils Absolute 09/26/2015 0.1  0 - 0.7 K/uL Final  . Basophils Relative 09/26/2015 1   Final  . Basophils Absolute 09/26/2015 0.1  0 - 0.1 K/uL Final  . Sodium 09/26/2015 134* 135 - 145 mmol/L Final  . Potassium 09/26/2015 3.9  3.5 - 5.1 mmol/L Final  . Chloride 09/26/2015 102  101 - 111 mmol/L Final  . CO2 09/26/2015 24  22 - 32 mmol/L Final  . Glucose, Bld 09/26/2015 103* 65 - 99 mg/dL Final  . BUN 09/26/2015 16  6 - 20 mg/dL Final  . Creatinine, Ser 09/26/2015 0.78  0.44 - 1.00 mg/dL Final  . Calcium 09/26/2015 8.9  8.9 - 10.3 mg/dL Final  . Total Protein 09/26/2015 7.7  6.5 - 8.1 g/dL Final  . Albumin 09/26/2015 4.3  3.5 - 5.0 g/dL Final  . AST 09/26/2015 25  15 - 41 U/L Final  . ALT 09/26/2015 18  14 - 54 U/L Final  . Alkaline Phosphatase 09/26/2015 70  38 - 126  U/L Final  . Total Bilirubin 09/26/2015 0.3  0.3 - 1.2 mg/dL Final  . GFR calc non Af Amer 09/26/2015 >60  >60 mL/min Final  . GFR calc Af Amer 09/26/2015 >60  >60 mL/min Final   Comment: (NOTE)  The eGFR has been calculated using the CKD EPI equation. This calculation has not been validated in all clinical situations. eGFR's persistently <60 mL/min signify possible Chronic Kidney Disease.   . Anion gap 09/26/2015 8  5 - 15 Final    STUDIES: No results found.  ASSESSMENT:  Carcinoma of right breast, stage I B, T1b N0 M0, ER/PR positive HER-2/neu negative.   PLAN:   1. Carcinoma of right breast. Patient was originally diagnosed in July 2016 with invasive carcinoma of the breast. She underwent lumpectomy and axillary lymph node evaluation. Patient however, did not want to undergo radiation therapy and elected to have a full mastectomy performed. Right mastectomy was performed in August 2016. She is currently on tamoxifen. She is due for another mammogram and has a mammogram scheduled for tomorrow. She has follow-up with Dr. Jamal Collin in approximately 2 weeks. We will recheck estradiol in the next day or 2 to see if medication should be switched.  2. Myalgias and lower extremities. Unsure if related to tamoxifen use. Advised patient to discontinue tamoxifen for 2 weeks to see if there was any improvement in lower extremity pain. 3. Pain in the right shoulder and chest wall near incision site. It does not sound like neuropathic pain. Patient pinpoint tenderness to an area of tenderness along the right side of her sternum, concerning for costochondritis. Also states that she has increasing arthritic tenderness in the right shoulder. Advised patient that we would trial meloxicam 7.5 mg daily for 15 days. Should this be effective for her pain she is to contact her PCP for further refills. 4. Insomnia. The patient insisted that her insomnia has worsened over the last 2 weeks with increasing pain in her  lower extremities. We'll trial also 2 weeks of Ambien. Advised patient that she would need to meet with a psychiatrist in order to be prescribed this long term. Patient also understands that her psychiatric medications need further evaluation as she reports they no longer work for her.  5. Rectal pain/bleeding. Patient has been in the emergency room with rectal bleeding, blood mixed in her stool, with complaints of pain that lasts 1-2 hours. Patient states that she has had a colonoscopy in the past but more than 10 years ago and while she was in [place]. We will refer patient to gastroenterology for further evaluation. 6. Iron deficiency anemia. Patient's hemoglobin is the highest it has been for several months at 9.5 today, her MCV is 64.2. Would like to try further ferritin, iron/TIBC, as well as estradiol labs but she is dehydrated and was previously a difficult stick in the lab requiring 4 different attempts. After recheck of lab work tomorrow we will contact patient with results and schedule for Feraheme if needed.  Patient expressed understanding and was in agreement with this plan. She also understands that She can call clinic at any time with any questions, concerns, or complaints.   Dr. Rogue Bussing was available for consultation and review of plan of care for this patient.  Cancer of right breast [facility])   Staging form: Breast, AJCC 7th Edition     Clinical: Stage IA (T1a, N0, M0) - Signed by Forest Gleason, MD on 01/13/2015   Evlyn Kanner, NP   09/26/2015 4:45 PM

## 2015-09-27 ENCOUNTER — Ambulatory Visit
Admission: RE | Admit: 2015-09-27 | Discharge: 2015-09-27 | Disposition: A | Discharge status: Home / Self Care | Payer: Medicaid Other | Source: Ambulatory Visit | Attending: General Surgery | Admitting: General Surgery

## 2015-09-27 ENCOUNTER — Other Ambulatory Visit: Payer: Self-pay | Admitting: Family Medicine

## 2015-09-27 DIAGNOSIS — Z1231 Encounter for screening mammogram for malignant neoplasm of breast: Secondary | ICD-10-CM | POA: Insufficient documentation

## 2015-09-27 HISTORY — DX: Malignant neoplasm of unspecified site of unspecified female breast: C50.919

## 2015-09-27 MED ORDER — FERROUS FUM-IRON POLYSACCH 162-115.2 MG PO CAPS: 1.0000 | ORAL_CAPSULE | Freq: Every day | ORAL | Status: AC

## 2015-10-01 ENCOUNTER — Encounter: Payer: Self-pay | Admitting: *Deleted

## 2015-10-04 ENCOUNTER — Inpatient Hospital Stay: Payer: Medicaid Other

## 2015-10-10 ENCOUNTER — Ambulatory Visit (INDEPENDENT_AMBULATORY_CARE_PROVIDER_SITE_OTHER): Payer: Medicaid Other | Admitting: General Surgery

## 2015-10-10 ENCOUNTER — Encounter: Payer: Self-pay | Admitting: General Surgery

## 2015-10-10 VITALS — BP 158/84 | HR 70 | Resp 14 | Ht 67.0 in | Wt 206.0 lb

## 2015-10-10 DIAGNOSIS — C50211 Malignant neoplasm of upper-inner quadrant of right female breast: Secondary | ICD-10-CM

## 2015-10-10 NOTE — Progress Notes (Signed)
Patient ID: Elizabeth Flynn, female   DOB: 18-Jan-1971, 45 y.o.   MRN: [id number]  Chief Complaint  Patient presents with  . Follow-up    mammogram    HPI Elizabeth Flynn is a 45 y.o. female.  who presents for a breast evaluation. The most recent left mammogram was done on 09-27-15.  Patient does perform regular self breast checks and gets regular mammograms done.   She is worried that the cancer may come back in the other breast.   HPI  Past Medical History  Diagnosis Date  . Kidney stones   . Anemia   . Ovarian cyst   . Blood transfusion without reported diagnosis 2016    patient has had 25 transfusions throughout her life. her blood levels run chronically low  . Hypertension   . Headache   . MRSA infection 2009  . Enlarged heart   . BRCA negative 10-16-14  . PONV (postoperative nausea and vomiting)     DRY HEAVING AFTER BREAST LUMPECTOMY 10-2014  . Diarrhea   . Cardiomyopathy ([place])   . Pancreatitis   . Depression   . Stroke [facility])     Sept. 2014 rt sided bleed  . Stroke ([place])   . GERD (gastroesophageal reflux disease)   . Dysrhythmia     TACHYCARDIA...happens randomly. not seen by any cardiologist  . Shortness of breath dyspnea     with exertion  . Anxiety     takes nothing  . Cancer of right breast ([place]) 10/16/2014    Upper and inner quadrant right breast, Invasive ductal carcinoma, Atypical Ductal Hyperplasia, ER/PR Pos, Her 2 Neg  . Breast cancer ([place]) 2016    RT MASTECTOMY    Past Surgical History  Procedure Laterality Date  . Cholecystectomy    . Tonsillectomy    . Hernia repair    . Tubal ligation    . Lithotripsy    . Breast lumpectomy with sentinel lymph node biopsy Right 10/23/2014    Procedure: Right breast lumpectomy with sentinel node biopsy ;  Surgeon: Christene Lye, MD;  Location: [facility] ORS;  Service: General;  Laterality: Right;  . Mastectomy modified radical Right 12/05/2014    Procedure: MASTECTOMY MODIFIED RADICAL;  Surgeon:  Christene Lye, MD;  Location: [facility] ORS;  Service: General;  Laterality: Right;  . Breast biopsy Right 10/10/2014    Right breast invasive carcinoma. T1A, N0.  . Dilatation & currettage/hysteroscopy with resectocope N/A 03/18/2015    Procedure: DILATATION & CURETTAGE/HYSTEROSCOPY WITH RESECTOCOPE;  Surgeon: Brayton Mars, MD;  Location: [facility] ORS;  Service: Gynecology;  Laterality: N/A;  . Vaginal hysterectomy Bilateral 04/29/2015    Procedure: HYSTERECTOMY VAGINAL/ BILATERAL SALPINGOOPHORECTOMY;  Surgeon: Brayton Mars, MD;  Location: [facility] ORS;  Service: Gynecology;  Laterality: Bilateral;  . Oophorectomy Bilateral   . Mastectomy Right 2016    BREAST CA  . Abdominal hysterectomy      Family History  Problem Relation Age of Onset  . Breast cancer Cousin 67  . Heart disease Maternal Grandmother   . Diabetes Paternal Grandmother   . Heart disease Paternal Grandmother   . Ovarian cancer Neg Hx   . Colon cancer Neg Hx     Social History Social History  Substance Use Topics  . Smoking status: Former Smoker    Types: Cigarettes    Quit date: 04/26/1995  . Smokeless tobacco: Never Used     Comment: no passive smoke in home  . Alcohol Use: No  Comment: SOBER FOR 3 [id number])    Allergies  Allergen Reactions  . Hydrocodone-Acetaminophen Hives and Itching  . Reglan [Metoclopramide] Other (See Comments)    "freaks me out, makes me nervous"  . Clindamycin/Lincomycin Rash  . Morphine And Related Rash    Current Outpatient Prescriptions  Medication Sig Dispense Refill  . acetaminophen (TYLENOL) 500 MG tablet Take 2 tablets (1,000 mg total) by mouth every 6 (six) hours as needed. For pain 30 tablet 0  . amLODipine (NORVASC) 10 MG tablet Take 1 tablet (10 mg total) by mouth daily. (Patient taking differently: Take 10 mg by mouth every morning. ) 30 tablet 0  . ferrous fumarate-iron polysaccharide complex (TANDEM) 162-115.2 MG CAPS capsule Take 1 capsule by mouth  daily with breakfast. 30 capsule 6  . FLUoxetine (PROZAC) 20 MG capsule TAKE ONE TABLET BY MOUTH DAILY FOR DEPRESSION  5  . lamoTRIgine (LAMICTAL) 25 MG tablet Take 25 mg by mouth every morning.     Marland Kitchen lisinopril (PRINIVIL,ZESTRIL) 20 MG tablet Take 1 tablet (20 mg total) by mouth every evening. 30 tablet 0  . meloxicam (MOBIC) 7.5 MG tablet Take 1 tablet (7.5 mg total) by mouth daily. 15 tablet 0  . vitamin C (ASCORBIC ACID) 500 MG tablet Take 500 mg by mouth daily.    Marland Kitchen zolpidem (AMBIEN) 5 MG tablet Take 1 tablet (5 mg total) by mouth at bedtime as needed for sleep. 15 tablet 0  . tamoxifen (NOLVADEX) 20 MG tablet TAKE 1 TABLET (20 MG TOTAL) BY MOUTH DAILY. (Patient not taking: Reported on 10/10/2015) 30 tablet 6   No current facility-administered medications for this visit.    Review of Systems Review of Systems  Constitutional: Negative.   Respiratory: Negative.   Cardiovascular: Negative.     Blood pressure 158/84, pulse 70, resp. rate 14, height _0  (1.702 m), weight 206 lb (93.441 kg), last menstrual period 03/26/2015.  Physical Exam Physical Exam  Constitutional: She is oriented to person, place, and time. She appears well-developed and well-nourished.  HENT:  Mouth/Throat: Oropharynx is clear and moist.  Eyes: Conjunctivae are normal. No scleral icterus.  Neck: Neck supple.  Cardiovascular: Normal rate, regular rhythm and normal heart sounds.   Pulmonary/Chest: Effort normal and breath sounds normal. Left breast exhibits no inverted nipple, no mass, no nipple discharge, no skin change and no tenderness.    Right mastectomy site is well healed.   Lymphadenopathy:    She has no cervical adenopathy.    She has no axillary adenopathy.  Neurological: She is alert and oriented to person, place, and time.  Skin: Skin is warm and dry.  Psychiatric: Her behavior is normal.    Data Reviewed Mammogram reviewed and stable  Assessment    History of cancer of right breast       Plan    Discussed importance of staying on anti-hormonal therapy for preventative therapy. Recommend continuation of Tamoxifen.  Discussed the risks and benefits of left mastectomy.      PCP:  Dolores Frame  This information has been scribed by Karie Fetch RN, BSN,BC.   Vandana Haman G 10/14/2015, 5:59 PM

## 2015-10-10 NOTE — Patient Instructions (Signed)
Continue with Tamoxifen.   Follow-up with [place] and call office with any questions.

## 2015-10-11 ENCOUNTER — Inpatient Hospital Stay: Payer: Medicaid Other

## 2015-10-15 ENCOUNTER — Inpatient Hospital Stay: Payer: Medicaid Other | Attending: Family Medicine

## 2015-10-15 DIAGNOSIS — C50811 Malignant neoplasm of overlapping sites of right female breast: Secondary | ICD-10-CM | POA: Insufficient documentation

## 2015-10-15 DIAGNOSIS — Z9011 Acquired absence of right breast and nipple: Secondary | ICD-10-CM | POA: Insufficient documentation

## 2015-10-15 DIAGNOSIS — Z87820 Personal history of traumatic brain injury: Secondary | ICD-10-CM | POA: Insufficient documentation

## 2015-10-15 DIAGNOSIS — I10 Essential (primary) hypertension: Secondary | ICD-10-CM | POA: Insufficient documentation

## 2015-10-15 DIAGNOSIS — D509 Iron deficiency anemia, unspecified: Secondary | ICD-10-CM | POA: Insufficient documentation

## 2015-10-15 DIAGNOSIS — K219 Gastro-esophageal reflux disease without esophagitis: Secondary | ICD-10-CM | POA: Insufficient documentation

## 2015-10-15 DIAGNOSIS — Z79899 Other long term (current) drug therapy: Secondary | ICD-10-CM | POA: Insufficient documentation

## 2015-10-15 DIAGNOSIS — Z8614 Personal history of Methicillin resistant Staphylococcus aureus infection: Secondary | ICD-10-CM | POA: Insufficient documentation

## 2015-10-15 DIAGNOSIS — Z170 Estrogen receptor positive status [ER+]: Secondary | ICD-10-CM | POA: Insufficient documentation

## 2015-10-15 DIAGNOSIS — Z79810 Long term (current) use of selective estrogen receptor modulators (SERMs): Secondary | ICD-10-CM | POA: Insufficient documentation

## 2015-10-15 NOTE — Progress Notes (Unsigned)
Unable to obtain IV access (7attempts).  Elizabeth Flynn will contact Dr. Jamal Collin for port placement. This is patient's request to have port. She will be in contact with [facility] after port placed.

## 2015-10-16 ENCOUNTER — Telehealth: Payer: Self-pay | Admitting: *Deleted

## 2015-10-16 NOTE — Telephone Encounter (Signed)
Patient called inquiring about scheduling a port placement and she still desires to have a Left mastectomy scheduled. She has not heard back and would like to get this scheduled. Please advise.

## 2015-10-17 ENCOUNTER — Other Ambulatory Visit: Payer: Self-pay | Admitting: Family Medicine

## 2015-10-17 ENCOUNTER — Telehealth: Payer: Self-pay | Admitting: Family Medicine

## 2015-10-17 NOTE — Telephone Encounter (Signed)
Spoke with Dr. Jamal Collin, patient is requesting a left prophylactic mastectomy and also having poor venous access with inability to receive IV Feraheme for iron deficiency anemia. Dr. Olegario Shearer light patient evaluated by oncology to discuss prophylactic mastectomy as well as port placement versus continued oral iron supplementation.  Appointment has been made with Dr. Grayland Ormond for July 17 at 3:30 PM. Patient is aware of appointment and have I discussed care with Dr. Grayland Ormond. Dr. Angie Fava office is also aware of pending appointment.

## 2015-10-17 NOTE — Telephone Encounter (Signed)
Have talked with oncology. Pt is to see oncologist regarding mastectomy and need for port. Please check with pt to see if cancer center has contacted her.

## 2015-10-17 NOTE — Telephone Encounter (Signed)
Patient notified regarding appointment. She is aware of her appointment with Dr. Grayland Ormond on 10/21/15.

## 2015-10-21 ENCOUNTER — Inpatient Hospital Stay (HOSPITAL_BASED_OUTPATIENT_CLINIC_OR_DEPARTMENT_OTHER): Payer: Medicaid Other | Admitting: Oncology

## 2015-10-21 VITALS — BP 179/114 | HR 88 | Temp 97.2°F | Resp 18 | Wt 208.3 lb

## 2015-10-21 DIAGNOSIS — Z79899 Other long term (current) drug therapy: Secondary | ICD-10-CM | POA: Diagnosis not present

## 2015-10-21 DIAGNOSIS — Z170 Estrogen receptor positive status [ER+]: Secondary | ICD-10-CM

## 2015-10-21 DIAGNOSIS — D509 Iron deficiency anemia, unspecified: Secondary | ICD-10-CM | POA: Diagnosis not present

## 2015-10-21 DIAGNOSIS — Z79810 Long term (current) use of selective estrogen receptor modulators (SERMs): Secondary | ICD-10-CM | POA: Diagnosis not present

## 2015-10-21 DIAGNOSIS — Z9011 Acquired absence of right breast and nipple: Secondary | ICD-10-CM | POA: Diagnosis not present

## 2015-10-21 DIAGNOSIS — C50811 Malignant neoplasm of overlapping sites of right female breast: Secondary | ICD-10-CM | POA: Diagnosis not present

## 2015-10-21 DIAGNOSIS — I10 Essential (primary) hypertension: Secondary | ICD-10-CM | POA: Diagnosis not present

## 2015-10-21 DIAGNOSIS — Z87820 Personal history of traumatic brain injury: Secondary | ICD-10-CM | POA: Diagnosis not present

## 2015-10-21 DIAGNOSIS — Z8614 Personal history of Methicillin resistant Staphylococcus aureus infection: Secondary | ICD-10-CM | POA: Diagnosis not present

## 2015-10-21 DIAGNOSIS — K219 Gastro-esophageal reflux disease without esophagitis: Secondary | ICD-10-CM | POA: Diagnosis not present

## 2015-10-21 NOTE — Progress Notes (Signed)
States is here to discuss getting PAC placed due to needing IV iron and has poor venous access. Pt wants prophylactic mastectomy to left breast. States cousin recently passed away from breast cancer. Currently having occasional rectal bleeding and is waiting for an appt with Dr. Allen Norris for colonoscopy.

## 2015-10-28 ENCOUNTER — Telehealth: Payer: Self-pay | Admitting: *Deleted

## 2015-10-28 ENCOUNTER — Other Ambulatory Visit: Payer: Self-pay | Admitting: *Deleted

## 2015-10-28 DIAGNOSIS — D509 Iron deficiency anemia, unspecified: Secondary | ICD-10-CM

## 2015-10-28 NOTE — Telephone Encounter (Signed)
Her note is now dictated. We can re-send the referral.

## 2015-10-28 NOTE — Progress Notes (Signed)
[place]  [phone] [phone]) [phone]  ID: Elizabeth Flynn OB: 1970-11-03  MR#: [id number]  EPP#:[id number]  Patient Care Team: Janalyn Shy, MD as PCP - General (Family Medicine) Rico Junker, RN as Registered Nurse (Radiology) Theodore Demark, RN as Registered Nurse Seeplaputhur Robinette Haines, MD (General Surgery)  CHIEF COMPLAINT: Cancer midline right breast, iron deficiency anemia.  INTERVAL HISTORY: Patient returns to clinic today for further evaluation and discussion of prophylactic left mastectomy as well as access for iron deficiency anemia. She is tolerating tamoxifen well without significant side effects. She does admit to persistent weakness and fatigue, but otherwise feels well. She has no neurologic complaints. She denies any chest pain or shortness of breath. She denies any nausea, vomiting, constipation, or diarrhea. She has a history of rectal pain and bleeding. She has no urinary complaints. Patient offers no further specific complaints today.  REVIEW OF SYSTEMS:   Review of Systems  Constitutional: Positive for malaise/fatigue. Negative for fever and weight loss.  Respiratory: Positive for shortness of breath.   Cardiovascular: Negative.  Negative for chest pain.  Gastrointestinal: Positive for blood in stool. Negative for abdominal pain and melena.  Genitourinary: Negative.   Musculoskeletal: Negative.   Neurological: Positive for weakness.  Psychiatric/Behavioral: Negative.     As per HPI. Otherwise, a complete review of systems is negatve.  PAST MEDICAL HISTORY: Past Medical History:  Diagnosis Date  . Anemia   . Anxiety    takes nothing  . Blood transfusion without reported diagnosis 2016   patient has had 25 transfusions throughout her life. her blood levels run chronically low  . BRCA negative 10-16-14  . Breast cancer ([place]) 2016   RT MASTECTOMY  . Cancer of right breast ([place]) 10/16/2014   Upper and inner quadrant  right breast, Invasive ductal carcinoma, Atypical Ductal Hyperplasia, ER/PR Pos, Her 2 Neg  . Cardiomyopathy ([place])   . Depression   . Diarrhea   . Dysrhythmia    TACHYCARDIA...happens randomly. not seen by any cardiologist  . Enlarged heart   . GERD (gastroesophageal reflux disease)   . Headache   . Hypertension   . Kidney stones   . MRSA infection 2009  . Ovarian cyst   . Pancreatitis   . PONV (postoperative nausea and vomiting)    DRY HEAVING AFTER BREAST LUMPECTOMY 10-2014  . Shortness of breath dyspnea    with exertion  . Stroke [facility])    Sept. 2014 rt sided bleed  . Stroke [facility])     PAST SURGICAL HISTORY: Past Surgical History:  Procedure Laterality Date  . ABDOMINAL HYSTERECTOMY    . BREAST BIOPSY Right 10/10/2014   Right breast invasive carcinoma. T1A, N0.  Marland Kitchen BREAST LUMPECTOMY WITH SENTINEL LYMPH NODE BIOPSY Right 10/23/2014   Procedure: Right breast lumpectomy with sentinel node biopsy ;  Surgeon: Christene Lye, MD;  Location: [facility] ORS;  Service: General;  Laterality: Right;  . CHOLECYSTECTOMY    . DILATATION & CURRETTAGE/HYSTEROSCOPY WITH RESECTOCOPE N/A 03/18/2015   Procedure: DILATATION & CURETTAGE/HYSTEROSCOPY WITH RESECTOCOPE;  Surgeon: Brayton Mars, MD;  Location: [facility] ORS;  Service: Gynecology;  Laterality: N/A;  . HERNIA REPAIR    . LITHOTRIPSY    . MASTECTOMY Right 2016   BREAST CA  . MASTECTOMY MODIFIED RADICAL Right 12/05/2014   Procedure: MASTECTOMY MODIFIED RADICAL;  Surgeon: Christene Lye, MD;  Location: [facility] ORS;  Service: General;  Laterality: Right;  . OOPHORECTOMY Bilateral   . TONSILLECTOMY    .  TUBAL LIGATION    . VAGINAL HYSTERECTOMY Bilateral 04/29/2015   Procedure: HYSTERECTOMY VAGINAL/ BILATERAL SALPINGOOPHORECTOMY;  Surgeon: Brayton Mars, MD;  Location: [facility] ORS;  Service: Gynecology;  Laterality: Bilateral;    FAMILY HISTORY: Family History  Problem Relation Age of Onset  . Breast cancer Cousin 23  . Heart  disease Maternal Grandmother   . Diabetes Paternal Grandmother   . Heart disease Paternal Grandmother   . Ovarian cancer Neg Hx   . Colon cancer Neg Hx        ADVANCED DIRECTIVES:    HEALTH MAINTENANCE: Social History  Substance Use Topics  . Smoking status: Former Smoker    Types: Cigarettes    Quit date: 04/26/1995  . Smokeless tobacco: Never Used     Comment: no passive smoke in home  . Alcohol use No     Comment: SOBER FOR 3 NUUVO([id number])     Colonoscopy:  PAP:  Bone density:  Lipid panel:  Allergies  Allergen Reactions  . Hydrocodone-Acetaminophen Hives and Itching  . Reglan [Metoclopramide] Other (See Comments)    "freaks me out, makes me nervous"  . Clindamycin/Lincomycin Rash  . Morphine And Related Rash    Current Outpatient Prescriptions  Medication Sig Dispense Refill  . acetaminophen (TYLENOL) 500 MG tablet Take 2 tablets (1,000 mg total) by mouth every 6 (six) hours as needed. For pain 30 tablet 0  . amLODipine (NORVASC) 10 MG tablet Take 1 tablet (10 mg total) by mouth daily. (Patient taking differently: Take 10 mg by mouth every morning. ) 30 tablet 0  . ferrous fumarate-iron polysaccharide complex (TANDEM) 162-115.2 MG CAPS capsule Take 1 capsule by mouth daily with breakfast. 30 capsule 6  . FLUoxetine (PROZAC) 20 MG capsule TAKE ONE TABLET BY MOUTH DAILY FOR DEPRESSION  5  . lamoTRIgine (LAMICTAL) 25 MG tablet Take 25 mg by mouth every morning.     Marland Kitchen lisinopril (PRINIVIL,ZESTRIL) 20 MG tablet Take 1 tablet (20 mg total) by mouth every evening. 30 tablet 0  . meloxicam (MOBIC) 7.5 MG tablet TAKE ONE TABLET BY MOUTH DAILY 15 tablet 0  . tamoxifen (NOLVADEX) 20 MG tablet TAKE 1 TABLET (20 MG TOTAL) BY MOUTH DAILY. 30 tablet 6  . vitamin C (ASCORBIC ACID) 500 MG tablet Take 500 mg by mouth daily.    Marland Kitchen zolpidem (AMBIEN) 5 MG tablet Take 1 tablet (5 mg total) by mouth at bedtime as needed for sleep. 15 tablet 0   No current facility-administered medications  for this visit.     OBJECTIVE: Vitals:   10/21/15 1550  BP: (!) 179/114  Pulse: 88  Resp: 18  Temp: 97.2 F (36.2 C)     Body mass index is 32.63 kg/m.    ECOG FS:0 - Asymptomatic  General: Well-developed, well-nourished, no acute distress. Eyes: Pink conjunctiva, anicteric sclera. Breasts: Right mastectomy. Lungs: Clear to auscultation bilaterally. Heart: Regular rate and rhythm. No rubs, murmurs, or gallops. Abdomen: Soft, nontender, nondistended. No organomegaly noted, normoactive bowel sounds. Musculoskeletal: No edema, cyanosis, or clubbing. Neuro: Alert, answering all questions appropriately. Cranial nerves grossly intact. Skin: No rashes or petechiae noted. Psych: Normal affect.   LAB RESULTS:  Lab Results  Component Value Date   NA 134 (L) 09/26/2015   K 3.9 09/26/2015   CL 102 09/26/2015   CO2 24 09/26/2015   GLUCOSE 103 (H) 09/26/2015   BUN 16 09/26/2015   CREATININE 0.78 09/26/2015   CALCIUM 8.9 09/26/2015   PROT 7.7 09/26/2015  ALBUMIN 4.3 09/26/2015   AST 25 09/26/2015   ALT 18 09/26/2015   ALKPHOS 70 09/26/2015   BILITOT 0.3 09/26/2015   GFRNONAA >60 09/26/2015   GFRAA >60 09/26/2015    Lab Results  Component Value Date   WBC 7.9 09/26/2015   NEUTROABS 4.7 09/26/2015   HGB 9.5 (L) 09/26/2015   HCT 31.0 (L) 09/26/2015   MCV 64.2 (L) 09/26/2015   PLT 366 09/26/2015   Lab Results  Component Value Date   IRON 10 (L) 09/26/2015   TIBC 660 (H) 09/26/2015   IRONPCTSAT 2 (L) 09/26/2015   Lab Results  Component Value Date   FERRITIN 4 (L) 09/26/2015     STUDIES: No results found.  ASSESSMENT: Cancer midline right breast, iron deficiency anemia.  PLAN:    1. Cancer midline right breast: Although patient had stage I disease, she elected mastectomy since she did not want to undergo radiation therapy. Her right mastectomy was performed in August 2016. She currently is taking tamoxifen which will complete 5 years of treatment in September  2021. No further intervention is needed. Continue routine six-month follow-up was scheduled. 2. Prophylactic left mastectomy: Patient reports a cousin recently diagnosed with recurrent breast cancer causing her significant anxiety about recurrence of her known disease. She presents understanding that given the low stage of her disease, her chance of recurrence is minimal but not zero. She expressed understanding of the risk of a prophylactic mastectomy. She also presents understanding that even with prophylactic mastectomy, she is still a risk of developing breast cancer. Patient states she still would like to proceed with her procedure. She is also stated that she would like to be "equal" on both sides. A referral has been sent back to Dr. Jamal Collin for further evaluation and discussion. 3. Iron deficiency anemia: Patient has required multiple blood transfusions in the past. She has a difficult stick for both laboratory work and treatment. Patient continues to have persistent weakness and fatigue and is requesting a port placement for IV iron. She expressed understanding that this is not a common use for a port as well as the increased risks of infection and complication with his placement. She understands that her port will require flushing every 6 weeks and that it can be removed if it is no longer an use. Patient will also require colonoscopy in the near future. 4. Disposition: Patient will return to clinic to receive IV iron once she has her mastectomy and port placement.  Approximately 30 minutes was spent in discussion of which greater than 50% was consultation.  Patient expressed understanding and was in agreement with this plan. She also understands that She can call clinic at any time with any questions, concerns, or complaints.   Cancer of right breast [facility])   Staging form: Breast, AJCC 7th Edition   - Clinical: Stage IA (T1a, N0, M0) - Signed by Forest Gleason, MD on 01/13/2015  Lloyd Huger, MD   10/28/2015 12:01 AM

## 2015-10-28 NOTE — Telephone Encounter (Signed)
States a referral was to be sent to Dr Jamal Collin for mastectomy and port placement, but his office said they do not have one

## 2015-10-28 NOTE — Telephone Encounter (Signed)
New referral entered, port orders were faxed over previously.

## 2015-10-29 ENCOUNTER — Telehealth: Payer: Self-pay

## 2015-10-29 NOTE — Telephone Encounter (Signed)
Call to patient to schedule her surgery for mastectomy and port placement. She is scheduled for surgery at [facility] on 11/12/15. She will pre admit by phone. The patient is aware of date and instructions. Surgical instructions have been mailed to her.

## 2015-10-30 ENCOUNTER — Telehealth: Payer: Self-pay

## 2015-10-30 ENCOUNTER — Other Ambulatory Visit: Payer: Self-pay | Admitting: General Surgery

## 2015-10-30 DIAGNOSIS — Z853 Personal history of malignant neoplasm of breast: Secondary | ICD-10-CM

## 2015-10-30 NOTE — Telephone Encounter (Signed)
Patient has been scheduled

## 2015-10-30 NOTE — Telephone Encounter (Signed)
Message left for the patient to call back to schedule a pre op appointment with Dr Jamal Collin for surgery scheduled for 11/12/15.

## 2015-11-01 ENCOUNTER — Other Ambulatory Visit: Payer: Self-pay | Admitting: *Deleted

## 2015-11-01 MED ORDER — MELOXICAM 7.5 MG PO TABS: 7.5000 mg | ORAL_TABLET | Freq: Every day | ORAL | 0 refills | Status: DC

## 2015-11-05 ENCOUNTER — Encounter
Admission: RE | Admit: 2015-11-05 | Discharge: 2015-11-05 | Disposition: A | Discharge status: Home / Self Care | Payer: Medicaid Other | Source: Ambulatory Visit | Attending: General Surgery | Admitting: General Surgery

## 2015-11-05 ENCOUNTER — Ambulatory Visit (INDEPENDENT_AMBULATORY_CARE_PROVIDER_SITE_OTHER): Payer: Medicaid Other | Admitting: General Surgery

## 2015-11-05 VITALS — BP 140/86 | HR 76 | Resp 14 | Ht 67.0 in | Wt 206.0 lb

## 2015-11-05 DIAGNOSIS — I10 Essential (primary) hypertension: Secondary | ICD-10-CM | POA: Diagnosis not present

## 2015-11-05 DIAGNOSIS — C50211 Malignant neoplasm of upper-inner quadrant of right female breast: Secondary | ICD-10-CM | POA: Diagnosis not present

## 2015-11-05 DIAGNOSIS — R079 Chest pain, unspecified: Secondary | ICD-10-CM

## 2015-11-05 DIAGNOSIS — D509 Iron deficiency anemia, unspecified: Secondary | ICD-10-CM | POA: Diagnosis not present

## 2015-11-05 NOTE — Patient Instructions (Addendum)
The patient is aware to call back for any questions or concerns.  Implanted Port Home Guide An implanted port is a type of central line that is placed under the skin. Central lines are used to provide IV access when treatment or nutrition needs to be given through a person's veins. Implanted ports are used for long-term IV access. An implanted port may be placed because:   You need IV medicine that would be irritating to the small veins in your hands or arms.   You need long-term IV medicines, such as antibiotics.   You need IV nutrition for a long period.   You need frequent blood draws for lab tests.   You need dialysis.  Implanted ports are usually placed in the chest area, but they can also be placed in the upper arm, the abdomen, or the leg. An implanted port has two main parts:   Reservoir. The reservoir is round and will appear as a small, raised area under your skin. The reservoir is the part where a needle is inserted to give medicines or draw blood.   Catheter. The catheter is a thin, flexible tube that extends from the reservoir. The catheter is placed into a large vein. Medicine that is inserted into the reservoir goes into the catheter and then into the vein.  HOW WILL I CARE FOR MY INCISION SITE? Do not get the incision site wet. Bathe or shower as directed by your health care provider.  HOW IS MY PORT ACCESSED? Special steps must be taken to access the port:   Before the port is accessed, a numbing cream can be placed on the skin. This helps numb the skin over the port site.   Your health care provider uses a sterile technique to access the port.  Your health care provider must put on a mask and sterile gloves.  The skin over your port is cleaned carefully with an antiseptic and allowed to dry.  The port is gently pinched between sterile gloves, and a needle is inserted into the port.  Only "non-coring" port needles should be used to access the port. Once the  port is accessed, a blood return should be checked. This helps ensure that the port is in the vein and is not clogged.   If your port needs to remain accessed for a constant infusion, a clear (transparent) bandage will be placed over the needle site. The bandage and needle will need to be changed every week, or as directed by your health care provider.   Keep the bandage covering the needle clean and dry. Do not get it wet. Follow your health care provider's instructions on how to take a shower or bath while the port is accessed.   If your port does not need to stay accessed, no bandage is needed over the port.  WHAT IS FLUSHING? Flushing helps keep the port from getting clogged. Follow your health care provider's instructions on how and when to flush the port. Ports are usually flushed with saline solution or a medicine called heparin. The need for flushing will depend on how the port is used.   If the port is used for intermittent medicines or blood draws, the port will need to be flushed:   After medicines have been given.   After blood has been drawn.   As part of routine maintenance.   If a constant infusion is running, the port may not need to be flushed.  HOW LONG WILL MY PORT STAY   IMPLANTED? The port can stay in for as long as your health care provider thinks it is needed. When it is time for the port to come out, surgery will be done to remove it. The procedure is similar to the one performed when the port was put in.  WHEN SHOULD I SEEK IMMEDIATE MEDICAL CARE? When you have an implanted port, you should seek immediate medical care if:   You notice a bad smell coming from the incision site.   You have swelling, redness, or drainage at the incision site.   You have more swelling or pain at the port site or the surrounding area.   You have a fever that is not controlled with medicine.   This information is not intended to replace advice given to you by your health  care provider. Make sure you discuss any questions you have with your health care provider.   Document Released: 03/23/2005 Document Revised: 01/11/2013 Document Reviewed: 11/28/2012 Elsevier Interactive Patient Education [employer].

## 2015-11-05 NOTE — Patient Instructions (Signed)
  Your procedure is scheduled on: 11-12-15 (TUESDAY) Report to Same Day Surgery 2nd floor medical mall To find out your arrival time please call [phone] between 1PM - 3PM on 11-11-15 [facility])  Remember: Instructions that are not followed completely may result in serious medical risk, up to and including death, or upon the discretion of your surgeon and anesthesiologist your surgery may need to be rescheduled.    _x___ 1. Do not eat food or drink liquids after midnight. No gum chewing or hard candies.     __x__ 2. No Alcohol for 24 hours before or after surgery.   __x__3. No Smoking for 24 prior to surgery.   ____  4. Bring all medications with you on the day of surgery if instructed.    __x__ 5. Notify your doctor if there is any change in your medical condition     (cold, fever, infections).     Do not wear jewelry, make-up, hairpins, clips or nail polish.  Do not wear lotions, powders, or perfumes. You may wear deodorant.  Do not shave 48 hours prior to surgery. Men may shave face and neck.  Do not bring valuables to the hospital.    [facility] is not responsible for any belongings or valuables.               Contacts, dentures or bridgework may not be worn into surgery.  Leave your suitcase in the car. After surgery it may be brought to your room.  For patients admitted to the hospital, discharge time is determined by your treatment team.   Patients discharged the day of surgery will not be allowed to drive home.    Please read over the following fact sheets that you were given:   [facility] Preparing for Surgery and or MRSA Information   _x___ Take these medicines the morning of surgery with A SIP OF WATER:    1. AMLODIPINE  2. FLUOXETINE  3. LAMICTAL  4. LISINOPRIL  5. EFFEXOR  6.  ____ Fleet Enema (as directed)   _x___ Use CHG Soap or sage wipes as directed on instruction sheet   ____ Use inhalers on the day of surgery and bring to hospital day of  surgery  ____ Stop metformin 2 days prior to surgery    ____ Take 1/2 of usual insulin dose the night before surgery and none on the morning of surgery.   _X___ Stop aspirin or coumadin, or plavix-STOP ASPIRIN NOW  _x__ Stop Anti-inflammatories such as Advil, Aleve, Ibuprofen, Motrin, Naproxen,          Naprosyn, Goodies powders or aspirin products. Ok to take Tylenol.   _X___ Stop supplements until after surgery-STOP VITAMIN C NOW  ____ Bring C-Pap to the hospital.

## 2015-11-05 NOTE — Progress Notes (Signed)
Patient ID: Elizabeth Flynn, female   DOB: September 27, 1970, 45 y.o.   MRN: [id number]  Chief Complaint  Patient presents with  . Pre-op Exam    HPI Elizabeth Flynn is a 45 y.o. female is here today for her pre op evaluation for left mastectomy and venous access catheter and port placement on 11-12-15. She was recently evaluated by Dr. Grayland Ormond who cleared her for prophylactic left mastectomy. Her chronic anemia requires repeated iron infusions. He also cleared her for installment of a venous access catheter and port for this purpose. I have reviewed the history of present illness with the patient.  HPI  Past Medical History:  Diagnosis Date  . Anemia   . Anxiety    takes nothing  . Blood transfusion without reported diagnosis 2016   patient has had 25 transfusions throughout her life. her blood levels run chronically low  . BRCA negative 10-16-14  . Breast cancer ([name]) 2016   RT MASTECTOMY  . Cancer of right breast ([name]) 10/16/2014   Upper and inner quadrant right breast, Invasive ductal carcinoma, Atypical Ductal Hyperplasia, ER/PR Pos, Her 2 Neg  . Cardiomyopathy ([place])   . Depression   . Diarrhea   . Dysrhythmia    TACHYCARDIA...happens randomly. not seen by any cardiologist  . Enlarged heart   . GERD (gastroesophageal reflux disease)   . Headache   . Hypertension   . Kidney stones   . MRSA infection 2009  . Ovarian cyst   . Pancreatitis   . PONV (postoperative nausea and vomiting)    DRY HEAVING AFTER BREAST LUMPECTOMY 10-2014  . Shortness of breath dyspnea    with exertion  . Stroke [facility])    Sept. 2014 rt sided bleed  . Stroke [facility])     Past Surgical History:  Procedure Laterality Date  . ABDOMINAL HYSTERECTOMY    . BREAST BIOPSY Right 10/10/2014   Right breast invasive carcinoma. T1A, N0.  Marland Kitchen BREAST LUMPECTOMY WITH SENTINEL LYMPH NODE BIOPSY Right 10/23/2014   Procedure: Right breast lumpectomy with sentinel node biopsy ;  Surgeon: Christene Lye, MD;  Location:  [facility] ORS;  Service: General;  Laterality: Right;  . CHOLECYSTECTOMY    . DILATATION & CURRETTAGE/HYSTEROSCOPY WITH RESECTOCOPE N/A 03/18/2015   Procedure: DILATATION & CURETTAGE/HYSTEROSCOPY WITH RESECTOCOPE;  Surgeon: Brayton Mars, MD;  Location: [facility] ORS;  Service: Gynecology;  Laterality: N/A;  . HERNIA REPAIR    . LITHOTRIPSY    . MASTECTOMY Right 2016   BREAST CA  . MASTECTOMY MODIFIED RADICAL Right 12/05/2014   Procedure: MASTECTOMY MODIFIED RADICAL;  Surgeon: Christene Lye, MD;  Location: [facility] ORS;  Service: General;  Laterality: Right;  . OOPHORECTOMY Bilateral   . TONSILLECTOMY    . TUBAL LIGATION    . VAGINAL HYSTERECTOMY Bilateral 04/29/2015   Procedure: HYSTERECTOMY VAGINAL/ BILATERAL SALPINGOOPHORECTOMY;  Surgeon: Brayton Mars, MD;  Location: [facility] ORS;  Service: Gynecology;  Laterality: Bilateral;    Family History  Problem Relation Age of Onset  . Breast cancer Cousin 26  . Heart disease Maternal Grandmother   . Diabetes Paternal Grandmother   . Heart disease Paternal Grandmother   . Ovarian cancer Neg Hx   . Colon cancer Neg Hx     Social History Social History  Substance Use Topics  . Smoking status: Former Smoker    Types: Cigarettes    Quit date: 04/26/1995  . Smokeless tobacco: Never Used     Comment: no passive smoke in home  . Alcohol  use No     Comment: SOBER FOR 3 YHCWC(3762)    Allergies  Allergen Reactions  . Hydrocodone-Acetaminophen Hives and Itching  . Reglan [Metoclopramide] Other (See Comments)    "freaks me out, makes me nervous"  . Clindamycin/Lincomycin Rash  . Morphine And Related Rash    Current Outpatient Prescriptions  Medication Sig Dispense Refill  . acetaminophen (TYLENOL) 500 MG tablet Take 2 tablets (1,000 mg total) by mouth every 6 (six) hours as needed. For pain 30 tablet 0  . amLODipine (NORVASC) 10 MG tablet Take 1 tablet (10 mg total) by mouth daily. (Patient taking differently: Take 10 mg by mouth  every morning. ) 30 tablet 0  . ferrous fumarate-iron polysaccharide complex (TANDEM) 162-115.2 MG CAPS capsule Take 1 capsule by mouth daily with breakfast. 30 capsule 6  . FLUoxetine (PROZAC) 20 MG capsule TAKE ONE TABLET BY MOUTH DAILY FOR DEPRESSION  5  . lamoTRIgine (LAMICTAL) 25 MG tablet Take 25 mg by mouth every morning.     Marland Kitchen lisinopril (PRINIVIL,ZESTRIL) 20 MG tablet Take 1 tablet (20 mg total) by mouth every evening. 30 tablet 0  . meloxicam (MOBIC) 7.5 MG tablet Take 1 tablet (7.5 mg total) by mouth daily. 30 tablet 0  . tamoxifen (NOLVADEX) 20 MG tablet TAKE 1 TABLET (20 MG TOTAL) BY MOUTH DAILY. 30 tablet 6  . venlafaxine (EFFEXOR) 37.5 MG tablet Take 37.5 mg by mouth daily.    . vitamin C (ASCORBIC ACID) 500 MG tablet Take 500 mg by mouth daily.    Marland Kitchen zolpidem (AMBIEN) 5 MG tablet Take 1 tablet (5 mg total) by mouth at bedtime as needed for sleep. 15 tablet 0   No current facility-administered medications for this visit.     Review of Systems Review of Systems  Constitutional: Negative.   Respiratory: Negative.   Cardiovascular: Negative.     Blood pressure 140/86, pulse 76, resp. rate 14, height _0  (1.702 m), weight 206 lb (93.4 kg), last menstrual period 03/26/2015.  Physical Exam Physical Exam  Constitutional: She is oriented to person, place, and time. She appears well-developed and well-nourished.  HENT:  Mouth/Throat: Oropharynx is clear and moist.  Eyes: Conjunctivae are normal. No scleral icterus.  Neck: Neck supple.  Cardiovascular: Normal rate, regular rhythm and normal heart sounds.   Pulmonary/Chest: Effort normal and breath sounds normal. Left breast exhibits no inverted nipple, no mass, no nipple discharge, no skin change and no tenderness.  Right mastectomy site well healed.  Abdominal: Soft. Normal appearance.  Lymphadenopathy:    She has no cervical adenopathy.    She has no axillary adenopathy.  Neurological: She is alert and oriented to person,  place, and time.  Skin: Skin is warm and dry.  Psychiatric: Her behavior is normal.    Data Reviewed prior progress notes and Dr. Gary Fleet evaluation  Assessment    History of cancer of right breast treated with mastectomy and is currently on tamoxifen  Chronic anemia Prophylactic left mastectomy and venous access catheter and port placement    Plan    Proceed with prophylactic left mastectomy and venous access catheter and port placement for iron infusions.   The patient understands that a left mastectomy does not offer any survival benefit but does significantly reduce risk of recurrent breast CA. She understands. The risks and benefits of left mastectomy were discussed. Discussed importance of staying on anti-hormonal therapy . Recommend continuation of Tamoxifen.  The risks associated with central venous access including arterial, pulmonary  and venous injury were reviewed as also risks of infection and thrombosis The possible need for additional treatment if pulmonary injury occurs (chest tube placement) was discussed.          This information has been scribed by Karie Fetch RN, BSN,BC.  Jermika Olden G 11/05/2015, 9:25 AM

## 2015-11-07 ENCOUNTER — Encounter
Admission: RE | Admit: 2015-11-07 | Discharge: 2015-11-07 | Disposition: A | Discharge status: Home / Self Care | Payer: Medicaid Other | Source: Ambulatory Visit | Attending: General Surgery | Admitting: General Surgery

## 2015-11-07 DIAGNOSIS — Z01812 Encounter for preprocedural laboratory examination: Secondary | ICD-10-CM | POA: Insufficient documentation

## 2015-11-07 DIAGNOSIS — Z01810 Encounter for preprocedural cardiovascular examination: Secondary | ICD-10-CM | POA: Diagnosis present

## 2015-11-07 LAB — SURGICAL PCR SCREEN
MRSA, PCR: NEGATIVE
STAPHYLOCOCCUS AUREUS: NEGATIVE

## 2015-11-07 LAB — HEMOGLOBIN: HEMOGLOBIN: 9.6 g/dL — AB (ref 12.0–16.0)

## 2015-11-07 NOTE — Pre-Procedure Instructions (Addendum)
Called Dr Andree Elk regarding pt that c/o sharp right sided chest pain intermittently that seemed to start after she started caretaking again-Pt states that she just started lifting pts again and that she thinks her chest pain is coming from lifting, because she has not lifted like that since her right mastectomy in 2016- pt came in to PAT today to have EKG which showed nsr with short PR, non-specific st and t wave abnormality, hr 70-also told dr Andree Elk that she feels like her heart races but that is due to when her hgb is low.  Informed MD that pt does have htn and h/o stroke in 2014-Dr. Andree Elk said that pt does not need medical or cardiac clearance. Ok to proceed with surgery.

## 2015-11-12 ENCOUNTER — Ambulatory Visit: Payer: Medicaid Other | Admitting: Certified Registered"

## 2015-11-12 ENCOUNTER — Ambulatory Visit: Payer: Medicaid Other

## 2015-11-12 ENCOUNTER — Encounter
Admission: RE | Disposition: A | Discharge status: Home / Self Care | Payer: Self-pay | Source: Ambulatory Visit | Attending: General Surgery

## 2015-11-12 ENCOUNTER — Ambulatory Visit
Admission: RE | Admit: 2015-11-12 | Discharge: 2015-11-12 | Disposition: A | Discharge status: Home / Self Care | Payer: Medicaid Other | Source: Ambulatory Visit | Attending: General Surgery | Admitting: General Surgery

## 2015-11-12 ENCOUNTER — Other Ambulatory Visit: Payer: Self-pay | Admitting: General Surgery

## 2015-11-12 ENCOUNTER — Encounter: Payer: Self-pay | Admitting: *Deleted

## 2015-11-12 DIAGNOSIS — Z79810 Long term (current) use of selective estrogen receptor modulators (SERMs): Secondary | ICD-10-CM | POA: Diagnosis not present

## 2015-11-12 DIAGNOSIS — F419 Anxiety disorder, unspecified: Secondary | ICD-10-CM | POA: Insufficient documentation

## 2015-11-12 DIAGNOSIS — Z9889 Other specified postprocedural states: Secondary | ICD-10-CM | POA: Insufficient documentation

## 2015-11-12 DIAGNOSIS — Z9011 Acquired absence of right breast and nipple: Secondary | ICD-10-CM | POA: Insufficient documentation

## 2015-11-12 DIAGNOSIS — Z791 Long term (current) use of non-steroidal anti-inflammatories (NSAID): Secondary | ICD-10-CM | POA: Insufficient documentation

## 2015-11-12 DIAGNOSIS — I119 Hypertensive heart disease without heart failure: Secondary | ICD-10-CM | POA: Insufficient documentation

## 2015-11-12 DIAGNOSIS — Z4001 Encounter for prophylactic removal of breast: Secondary | ICD-10-CM | POA: Insufficient documentation

## 2015-11-12 DIAGNOSIS — Z853 Personal history of malignant neoplasm of breast: Secondary | ICD-10-CM | POA: Diagnosis not present

## 2015-11-12 DIAGNOSIS — D539 Nutritional anemia, unspecified: Secondary | ICD-10-CM | POA: Diagnosis present

## 2015-11-12 DIAGNOSIS — Z885 Allergy status to narcotic agent status: Secondary | ICD-10-CM | POA: Diagnosis not present

## 2015-11-12 DIAGNOSIS — F329 Major depressive disorder, single episode, unspecified: Secondary | ICD-10-CM | POA: Diagnosis not present

## 2015-11-12 DIAGNOSIS — Z8673 Personal history of transient ischemic attack (TIA), and cerebral infarction without residual deficits: Secondary | ICD-10-CM | POA: Insufficient documentation

## 2015-11-12 DIAGNOSIS — Z87891 Personal history of nicotine dependence: Secondary | ICD-10-CM | POA: Diagnosis not present

## 2015-11-12 DIAGNOSIS — D509 Iron deficiency anemia, unspecified: Secondary | ICD-10-CM | POA: Diagnosis not present

## 2015-11-12 DIAGNOSIS — K219 Gastro-esophageal reflux disease without esophagitis: Secondary | ICD-10-CM | POA: Diagnosis not present

## 2015-11-12 DIAGNOSIS — Z8614 Personal history of Methicillin resistant Staphylococcus aureus infection: Secondary | ICD-10-CM | POA: Diagnosis not present

## 2015-11-12 DIAGNOSIS — Z803 Family history of malignant neoplasm of breast: Secondary | ICD-10-CM | POA: Insufficient documentation

## 2015-11-12 DIAGNOSIS — Z9049 Acquired absence of other specified parts of digestive tract: Secondary | ICD-10-CM | POA: Diagnosis not present

## 2015-11-12 DIAGNOSIS — Z452 Encounter for adjustment and management of vascular access device: Secondary | ICD-10-CM | POA: Diagnosis not present

## 2015-11-12 DIAGNOSIS — Z90722 Acquired absence of ovaries, bilateral: Secondary | ICD-10-CM | POA: Diagnosis not present

## 2015-11-12 DIAGNOSIS — Z833 Family history of diabetes mellitus: Secondary | ICD-10-CM | POA: Insufficient documentation

## 2015-11-12 DIAGNOSIS — Z8249 Family history of ischemic heart disease and other diseases of the circulatory system: Secondary | ICD-10-CM | POA: Insufficient documentation

## 2015-11-12 DIAGNOSIS — Z79899 Other long term (current) drug therapy: Secondary | ICD-10-CM | POA: Insufficient documentation

## 2015-11-12 DIAGNOSIS — Z95828 Presence of other vascular implants and grafts: Secondary | ICD-10-CM

## 2015-11-12 DIAGNOSIS — Z888 Allergy status to other drugs, medicaments and biological substances status: Secondary | ICD-10-CM | POA: Insufficient documentation

## 2015-11-12 DIAGNOSIS — I428 Other cardiomyopathies: Secondary | ICD-10-CM | POA: Diagnosis not present

## 2015-11-12 DIAGNOSIS — Z90710 Acquired absence of both cervix and uterus: Secondary | ICD-10-CM | POA: Insufficient documentation

## 2015-11-12 DIAGNOSIS — Z87442 Personal history of urinary calculi: Secondary | ICD-10-CM | POA: Insufficient documentation

## 2015-11-12 DIAGNOSIS — Z881 Allergy status to other antibiotic agents status: Secondary | ICD-10-CM | POA: Insufficient documentation

## 2015-11-12 DIAGNOSIS — Z9851 Tubal ligation status: Secondary | ICD-10-CM | POA: Insufficient documentation

## 2015-11-12 DIAGNOSIS — C50211 Malignant neoplasm of upper-inner quadrant of right female breast: Secondary | ICD-10-CM | POA: Diagnosis not present

## 2015-11-12 HISTORY — PX: TOTAL MASTECTOMY: SHX6129

## 2015-11-12 HISTORY — PX: PORTACATH PLACEMENT: SHX2246

## 2015-11-12 SURGERY — MASTECTOMY, SIMPLE
Anesthesia: General | Site: Chest | Laterality: Right | Wound class: Clean

## 2015-11-12 MED ORDER — FAMOTIDINE 20 MG PO TABS: 20.0000 mg | ORAL_TABLET | Freq: Once | ORAL | Status: DC

## 2015-11-12 MED ORDER — CEFAZOLIN SODIUM-DEXTROSE 2-4 GM/100ML-% IV SOLN
INTRAVENOUS | Status: AC
  Filled 2015-11-12: qty 100

## 2015-11-12 MED ORDER — OXYCODONE HCL 5 MG PO TABS: 5.0000 mg | ORAL_TABLET | Freq: Four times a day (QID) | ORAL | 0 refills | Status: DC | PRN

## 2015-11-12 MED ORDER — ROCURONIUM BROMIDE 100 MG/10ML IV SOLN
INTRAVENOUS | Status: DC | PRN
  Administered 2015-11-12: 10 mg via INTRAVENOUS
  Administered 2015-11-12: 20 mg via INTRAVENOUS
  Administered 2015-11-12: 50 mg via INTRAVENOUS
  Administered 2015-11-12: 20 mg via INTRAVENOUS

## 2015-11-12 MED ORDER — HEPARIN SODIUM (PORCINE) 5000 UNIT/ML IJ SOLN
INTRAMUSCULAR | Status: AC
  Filled 2015-11-12: qty 1

## 2015-11-12 MED ORDER — ONDANSETRON HCL 4 MG/2ML IJ SOLN
4.0000 mg | Freq: Once | INTRAMUSCULAR | Status: AC | PRN
  Administered 2015-11-12: 4 mg via INTRAVENOUS

## 2015-11-12 MED ORDER — BUPIVACAINE HCL (PF) 0.5 % IJ SOLN
INTRAMUSCULAR | Status: DC | PRN
  Administered 2015-11-12: 15 mL

## 2015-11-12 MED ORDER — EPHEDRINE SULFATE 50 MG/ML IJ SOLN
INTRAMUSCULAR | Status: DC | PRN
  Administered 2015-11-12 (×3): 5 mg via INTRAVENOUS
  Administered 2015-11-12: 10 mg via INTRAVENOUS

## 2015-11-12 MED ORDER — LACTATED RINGERS IV SOLN
INTRAVENOUS | Status: DC
  Administered 2015-11-12: 10:00:00 via INTRAVENOUS

## 2015-11-12 MED ORDER — PROMETHAZINE HCL 25 MG/ML IJ SOLN: 6.2500 mg | Freq: Once | INTRAMUSCULAR | Status: DC

## 2015-11-12 MED ORDER — FENTANYL CITRATE (PF) 100 MCG/2ML IJ SOLN
INTRAMUSCULAR | Status: AC
  Filled 2015-11-12: qty 2

## 2015-11-12 MED ORDER — ONDANSETRON HCL 4 MG/2ML IJ SOLN
INTRAMUSCULAR | Status: AC
  Filled 2015-11-12: qty 2

## 2015-11-12 MED ORDER — SCOPOLAMINE 1 MG/3DAYS TD PT72
1.0000 | MEDICATED_PATCH | TRANSDERMAL | Status: DC
  Administered 2015-11-12: 1.5 mg via TRANSDERMAL

## 2015-11-12 MED ORDER — FENTANYL CITRATE (PF) 100 MCG/2ML IJ SOLN
25.0000 ug | INTRAMUSCULAR | Status: AC | PRN
  Administered 2015-11-12 (×6): 25 ug via INTRAVENOUS

## 2015-11-12 MED ORDER — KETOROLAC TROMETHAMINE 30 MG/ML IJ SOLN
INTRAMUSCULAR | Status: DC | PRN
  Administered 2015-11-12: 30 mg via INTRAVENOUS

## 2015-11-12 MED ORDER — ONDANSETRON HCL 4 MG/2ML IJ SOLN
INTRAMUSCULAR | Status: DC | PRN
  Administered 2015-11-12: 4 mg via INTRAVENOUS

## 2015-11-12 MED ORDER — BUPIVACAINE HCL (PF) 0.5 % IJ SOLN
INTRAMUSCULAR | Status: AC
  Filled 2015-11-12: qty 30

## 2015-11-12 MED ORDER — OXYCODONE HCL 5 MG PO TABS
ORAL_TABLET | ORAL | Status: AC
  Administered 2015-11-12: 5 mg via ORAL
  Filled 2015-11-12: qty 1

## 2015-11-12 MED ORDER — ACETAMINOPHEN 10 MG/ML IV SOLN
INTRAVENOUS | Status: DC | PRN
  Administered 2015-11-12: 1000 mg via INTRAVENOUS

## 2015-11-12 MED ORDER — SUGAMMADEX SODIUM 200 MG/2ML IV SOLN
INTRAVENOUS | Status: DC | PRN
  Administered 2015-11-12: 185 mg via INTRAVENOUS

## 2015-11-12 MED ORDER — PROMETHAZINE HCL 12.5 MG RE SUPP: 12.5000 mg | Freq: Four times a day (QID) | RECTAL | 0 refills | Status: AC | PRN

## 2015-11-12 MED ORDER — LIDOCAINE HCL (PF) 1 % IJ SOLN
INTRAMUSCULAR | Status: AC
  Filled 2015-11-12: qty 30

## 2015-11-12 MED ORDER — CHLORHEXIDINE GLUCONATE CLOTH 2 % EX PADS: 6.0000 | MEDICATED_PAD | Freq: Once | CUTANEOUS | Status: DC

## 2015-11-12 MED ORDER — SODIUM CHLORIDE 0.9 % IV SOLN
INTRAVENOUS | Status: DC | PRN
  Administered 2015-11-12: 10 mL via INTRAMUSCULAR

## 2015-11-12 MED ORDER — PROMETHAZINE HCL 25 MG/ML IJ SOLN
INTRAMUSCULAR | Status: AC
  Administered 2015-11-12: 6.25 mg via INTRAVENOUS
  Filled 2015-11-12: qty 1

## 2015-11-12 MED ORDER — PROMETHAZINE HCL 25 MG/ML IJ SOLN
6.2500 mg | Freq: Once | INTRAMUSCULAR | Status: AC
  Administered 2015-11-12: 6.25 mg via INTRAVENOUS
  Filled 2015-11-12: qty 1

## 2015-11-12 MED ORDER — DEXAMETHASONE SODIUM PHOSPHATE 10 MG/ML IJ SOLN
INTRAMUSCULAR | Status: DC | PRN
  Administered 2015-11-12: 4 mg via INTRAVENOUS

## 2015-11-12 MED ORDER — CEFAZOLIN SODIUM-DEXTROSE 2-4 GM/100ML-% IV SOLN
2.0000 g | INTRAVENOUS | Status: AC
  Administered 2015-11-12: 2 g via INTRAVENOUS

## 2015-11-12 MED ORDER — SCOPOLAMINE 1 MG/3DAYS TD PT72
MEDICATED_PATCH | TRANSDERMAL | Status: AC
  Filled 2015-11-12: qty 1

## 2015-11-12 MED ORDER — PROPOFOL 10 MG/ML IV BOLUS
INTRAVENOUS | Status: DC | PRN
  Administered 2015-11-12: 160 mg via INTRAVENOUS

## 2015-11-12 MED ORDER — OXYCODONE HCL 5 MG PO TABS
5.0000 mg | ORAL_TABLET | Freq: Four times a day (QID) | ORAL | Status: DC | PRN
  Administered 2015-11-12: 5 mg via ORAL

## 2015-11-12 MED ORDER — MIDAZOLAM HCL 2 MG/2ML IJ SOLN
INTRAMUSCULAR | Status: DC | PRN
  Administered 2015-11-12: 2 mg via INTRAVENOUS

## 2015-11-12 MED ORDER — ACETAMINOPHEN 10 MG/ML IV SOLN
INTRAVENOUS | Status: AC
  Filled 2015-11-12: qty 100

## 2015-11-12 MED ORDER — FAMOTIDINE 20 MG PO TABS
ORAL_TABLET | ORAL | Status: AC
  Administered 2015-11-12: 20 mg
  Filled 2015-11-12: qty 1

## 2015-11-12 MED ORDER — LIDOCAINE HCL (CARDIAC) 20 MG/ML IV SOLN
INTRAVENOUS | Status: DC | PRN
  Administered 2015-11-12: 100 mg via INTRAVENOUS

## 2015-11-12 MED ORDER — FENTANYL CITRATE (PF) 100 MCG/2ML IJ SOLN
INTRAMUSCULAR | Status: DC | PRN
  Administered 2015-11-12: 25 ug via INTRAVENOUS
  Administered 2015-11-12 (×2): 50 ug via INTRAVENOUS
  Administered 2015-11-12: 100 ug via INTRAVENOUS
  Administered 2015-11-12: 25 ug via INTRAVENOUS
  Administered 2015-11-12 (×3): 50 ug via INTRAVENOUS

## 2015-11-12 SURGICAL SUPPLY — 69 items
APPLIER CLIP 11 MED OPEN (CLIP)
APPLIER CLIP 13 LRG OPEN (CLIP)
BAG DECANTER FOR FLEXI CONT (MISCELLANEOUS) ×4 IMPLANT
BLADE SURG 10 STRL SS SAFETY (BLADE) ×4 IMPLANT
BLADE SURG 15 STRL SS SAFETY (BLADE) ×4 IMPLANT
BULB RESERV EVAC DRAIN JP 100C (MISCELLANEOUS) ×8 IMPLANT
CANISTER SUCT 1200ML W/VALVE (MISCELLANEOUS) ×4 IMPLANT
CHLORAPREP W/TINT 26ML (MISCELLANEOUS) ×8 IMPLANT
CLIP APPLIE 11 MED OPEN (CLIP) IMPLANT
CLIP APPLIE 13 LRG OPEN (CLIP) IMPLANT
CLOSURE WOUND 1/2 X4 (GAUZE/BANDAGES/DRESSINGS) ×2
COVER LIGHT HANDLE STERIS (MISCELLANEOUS) ×12 IMPLANT
DECANTER SPIKE VIAL GLASS SM (MISCELLANEOUS) IMPLANT
DEVICE DISSECT PLASMABLAD 3.0S (MISCELLANEOUS) ×2 IMPLANT
DRAIN CHANNEL JP 15F RND 16 (MISCELLANEOUS) ×8 IMPLANT
DRAPE C-ARM XRAY 36X54 (DRAPES) ×4 IMPLANT
DRAPE LAPAROTOMY TRNSV 106X77 (MISCELLANEOUS) ×4 IMPLANT
DRSG TELFA 3X8 NADH (GAUZE/BANDAGES/DRESSINGS) ×4 IMPLANT
ELECT CAUTERY BLADE 6.4 (BLADE) ×4 IMPLANT
ELECT REM PT RETURN 9FT ADLT (ELECTROSURGICAL) ×4
ELECTRODE REM PT RTRN 9FT ADLT (ELECTROSURGICAL) ×2 IMPLANT
GAUZE FLUFF 18X24 1PLY STRL (GAUZE/BANDAGES/DRESSINGS) ×4 IMPLANT
GAUZE SPONGE 4X4 12PLY STRL (GAUZE/BANDAGES/DRESSINGS) ×4 IMPLANT
GLOVE BIO SURGEON STRL SZ7 (GLOVE) ×32 IMPLANT
GOWN STRL REUS W/ TWL LRG LVL3 (GOWN DISPOSABLE) ×12 IMPLANT
GOWN STRL REUS W/TWL LRG LVL3 (GOWN DISPOSABLE) ×12
HANDLE YANKAUER SUCT BULB TIP (MISCELLANEOUS) ×4 IMPLANT
HARMONIC SCALPEL FOCUS (MISCELLANEOUS) IMPLANT
IV NS 500ML (IV SOLUTION) ×2
IV NS 500ML BAXH (IV SOLUTION) ×2 IMPLANT
KIT PORT POWER 8FR ISP CVUE (Catheter) ×4 IMPLANT
KIT RM TURNOVER STRD PROC AR (KITS) ×4 IMPLANT
LABEL OR SOLS (LABEL) ×4 IMPLANT
LIGHT WAVEGUIDE WIDE FLAT (MISCELLANEOUS) ×4 IMPLANT
LIQUID BAND (GAUZE/BANDAGES/DRESSINGS) ×4 IMPLANT
NEEDLE FILTER BLUNT 18X 1/2SAF (NEEDLE) ×2
NEEDLE FILTER BLUNT 18X1 1/2 (NEEDLE) ×2 IMPLANT
NEEDLE HYPO 25GX1X1/2 BEV (NEEDLE) ×4 IMPLANT
NS IRRIG 1000ML POUR BTL (IV SOLUTION) IMPLANT
NS IRRIG 500ML POUR BTL (IV SOLUTION) ×4 IMPLANT
PACK BASIN MINOR ARMC (MISCELLANEOUS) ×4 IMPLANT
PACK PORT-A-CATH (MISCELLANEOUS) ×4 IMPLANT
PAD ABD DERMACEA PRESS 5X9 (GAUZE/BANDAGES/DRESSINGS) ×4 IMPLANT
PIN SAFETY STRL (MISCELLANEOUS) ×4 IMPLANT
PLASMABLADE 3.0S (MISCELLANEOUS) ×4
SPONGE LAP 18X18 5 PK (GAUZE/BANDAGES/DRESSINGS) ×8 IMPLANT
STRIP CLOSURE SKIN 1/2X4 (GAUZE/BANDAGES/DRESSINGS) ×6 IMPLANT
SURGI-BRA LG (MISCELLANEOUS) ×4 IMPLANT
SUT ETHILON 2 0 FSLX (SUTURE) ×4 IMPLANT
SUT MNCRL AB 3-0 PS2 27 (SUTURE) ×8 IMPLANT
SUT PROLENE 2 0 SH DA (SUTURE) ×4 IMPLANT
SUT SILK 2 0 (SUTURE)
SUT SILK 2-0 18XBRD TIE 12 (SUTURE) IMPLANT
SUT SILK 3 0 (SUTURE)
SUT SILK 3-0 18XBRD TIE 12 (SUTURE) IMPLANT
SUT SILK 4 0 (SUTURE)
SUT SILK 4-0 18XBRD TIE 12 (SUTURE) IMPLANT
SUT VIC AB 2-0 CT1 27 (SUTURE) ×4
SUT VIC AB 2-0 CT1 TAPERPNT 27 (SUTURE) ×4 IMPLANT
SUT VIC AB 3-0 SH 27 (SUTURE) ×2
SUT VIC AB 3-0 SH 27X BRD (SUTURE) ×2 IMPLANT
SUT VIC AB 4-0 FS2 27 (SUTURE) ×4 IMPLANT
SUT VICRYL+ 3-0 144IN (SUTURE) ×4 IMPLANT
SWABSTK COMLB BENZOIN TINCTURE (MISCELLANEOUS) ×4 IMPLANT
SYR 3ML LL SCALE MARK (SYRINGE) ×4 IMPLANT
TOWEL OR 17X26 4PK STRL BLUE (TOWEL DISPOSABLE) ×8 IMPLANT
TUBING CONNECTING 10 (TUBING) ×3 IMPLANT
TUBING CONNECTING 10' (TUBING) ×1
WATER STERILE IRR 1000ML POUR (IV SOLUTION) ×8 IMPLANT

## 2015-11-12 NOTE — H&P (View-Only) (Signed)
Patient ID: Elizabeth Flynn, female   DOB: September 27, 1970, 45 y.o.   MRN: [id number]  Chief Complaint  Patient presents with  . Pre-op Exam    HPI Elizabeth Flynn is a 45 y.o. female is here today for her pre op evaluation for left mastectomy and venous access catheter and port placement on 11-12-15. She was recently evaluated by Dr. Grayland Ormond who cleared her for prophylactic left mastectomy. Her chronic anemia requires repeated iron infusions. He also cleared her for installment of a venous access catheter and port for this purpose. I have reviewed the history of present illness with the patient.  HPI  Past Medical History:  Diagnosis Date  . Anemia   . Anxiety    takes nothing  . Blood transfusion without reported diagnosis 2016   patient has had 25 transfusions throughout her life. her blood levels run chronically low  . BRCA negative 10-16-14  . Breast cancer ([name]) 2016   RT MASTECTOMY  . Cancer of right breast ([name]) 10/16/2014   Upper and inner quadrant right breast, Invasive ductal carcinoma, Atypical Ductal Hyperplasia, ER/PR Pos, Her 2 Neg  . Cardiomyopathy ([place])   . Depression   . Diarrhea   . Dysrhythmia    TACHYCARDIA...happens randomly. not seen by any cardiologist  . Enlarged heart   . GERD (gastroesophageal reflux disease)   . Headache   . Hypertension   . Kidney stones   . MRSA infection 2009  . Ovarian cyst   . Pancreatitis   . PONV (postoperative nausea and vomiting)    DRY HEAVING AFTER BREAST LUMPECTOMY 10-2014  . Shortness of breath dyspnea    with exertion  . Stroke [facility])    Sept. 2014 rt sided bleed  . Stroke [facility])     Past Surgical History:  Procedure Laterality Date  . ABDOMINAL HYSTERECTOMY    . BREAST BIOPSY Right 10/10/2014   Right breast invasive carcinoma. T1A, N0.  Marland Kitchen BREAST LUMPECTOMY WITH SENTINEL LYMPH NODE BIOPSY Right 10/23/2014   Procedure: Right breast lumpectomy with sentinel node biopsy ;  Surgeon: Christene Lye, MD;  Location:  [facility] ORS;  Service: General;  Laterality: Right;  . CHOLECYSTECTOMY    . DILATATION & CURRETTAGE/HYSTEROSCOPY WITH RESECTOCOPE N/A 03/18/2015   Procedure: DILATATION & CURETTAGE/HYSTEROSCOPY WITH RESECTOCOPE;  Surgeon: Brayton Mars, MD;  Location: [facility] ORS;  Service: Gynecology;  Laterality: N/A;  . HERNIA REPAIR    . LITHOTRIPSY    . MASTECTOMY Right 2016   BREAST CA  . MASTECTOMY MODIFIED RADICAL Right 12/05/2014   Procedure: MASTECTOMY MODIFIED RADICAL;  Surgeon: Christene Lye, MD;  Location: [facility] ORS;  Service: General;  Laterality: Right;  . OOPHORECTOMY Bilateral   . TONSILLECTOMY    . TUBAL LIGATION    . VAGINAL HYSTERECTOMY Bilateral 04/29/2015   Procedure: HYSTERECTOMY VAGINAL/ BILATERAL SALPINGOOPHORECTOMY;  Surgeon: Brayton Mars, MD;  Location: [facility] ORS;  Service: Gynecology;  Laterality: Bilateral;    Family History  Problem Relation Age of Onset  . Breast cancer Cousin 26  . Heart disease Maternal Grandmother   . Diabetes Paternal Grandmother   . Heart disease Paternal Grandmother   . Ovarian cancer Neg Hx   . Colon cancer Neg Hx     Social History Social History  Substance Use Topics  . Smoking status: Former Smoker    Types: Cigarettes    Quit date: 04/26/1995  . Smokeless tobacco: Never Used     Comment: no passive smoke in home  . Alcohol  use No     Comment: SOBER FOR 3 YHCWC(3762)    Allergies  Allergen Reactions  . Hydrocodone-Acetaminophen Hives and Itching  . Reglan [Metoclopramide] Other (See Comments)    "freaks me out, makes me nervous"  . Clindamycin/Lincomycin Rash  . Morphine And Related Rash    Current Outpatient Prescriptions  Medication Sig Dispense Refill  . acetaminophen (TYLENOL) 500 MG tablet Take 2 tablets (1,000 mg total) by mouth every 6 (six) hours as needed. For pain 30 tablet 0  . amLODipine (NORVASC) 10 MG tablet Take 1 tablet (10 mg total) by mouth daily. (Patient taking differently: Take 10 mg by mouth  every morning. ) 30 tablet 0  . ferrous fumarate-iron polysaccharide complex (TANDEM) 162-115.2 MG CAPS capsule Take 1 capsule by mouth daily with breakfast. 30 capsule 6  . FLUoxetine (PROZAC) 20 MG capsule TAKE ONE TABLET BY MOUTH DAILY FOR DEPRESSION  5  . lamoTRIgine (LAMICTAL) 25 MG tablet Take 25 mg by mouth every morning.     Marland Kitchen lisinopril (PRINIVIL,ZESTRIL) 20 MG tablet Take 1 tablet (20 mg total) by mouth every evening. 30 tablet 0  . meloxicam (MOBIC) 7.5 MG tablet Take 1 tablet (7.5 mg total) by mouth daily. 30 tablet 0  . tamoxifen (NOLVADEX) 20 MG tablet TAKE 1 TABLET (20 MG TOTAL) BY MOUTH DAILY. 30 tablet 6  . venlafaxine (EFFEXOR) 37.5 MG tablet Take 37.5 mg by mouth daily.    . vitamin C (ASCORBIC ACID) 500 MG tablet Take 500 mg by mouth daily.    Marland Kitchen zolpidem (AMBIEN) 5 MG tablet Take 1 tablet (5 mg total) by mouth at bedtime as needed for sleep. 15 tablet 0   No current facility-administered medications for this visit.     Review of Systems Review of Systems  Constitutional: Negative.   Respiratory: Negative.   Cardiovascular: Negative.     Blood pressure 140/86, pulse 76, resp. rate 14, height _0  (1.702 m), weight 206 lb (93.4 kg), last menstrual period 03/26/2015.  Physical Exam Physical Exam  Constitutional: She is oriented to person, place, and time. She appears well-developed and well-nourished.  HENT:  Mouth/Throat: Oropharynx is clear and moist.  Eyes: Conjunctivae are normal. No scleral icterus.  Neck: Neck supple.  Cardiovascular: Normal rate, regular rhythm and normal heart sounds.   Pulmonary/Chest: Effort normal and breath sounds normal. Left breast exhibits no inverted nipple, no mass, no nipple discharge, no skin change and no tenderness.  Right mastectomy site well healed.  Abdominal: Soft. Normal appearance.  Lymphadenopathy:    She has no cervical adenopathy.    She has no axillary adenopathy.  Neurological: She is alert and oriented to person,  place, and time.  Skin: Skin is warm and dry.  Psychiatric: Her behavior is normal.    Data Reviewed prior progress notes and Dr. Gary Fleet evaluation  Assessment    History of cancer of right breast treated with mastectomy and is currently on tamoxifen  Chronic anemia Prophylactic left mastectomy and venous access catheter and port placement    Plan    Proceed with prophylactic left mastectomy and venous access catheter and port placement for iron infusions.   The patient understands that a left mastectomy does not offer any survival benefit but does significantly reduce risk of recurrent breast CA. She understands. The risks and benefits of left mastectomy were discussed. Discussed importance of staying on anti-hormonal therapy . Recommend continuation of Tamoxifen.  The risks associated with central venous access including arterial, pulmonary  and venous injury were reviewed as also risks of infection and thrombosis The possible need for additional treatment if pulmonary injury occurs (chest tube placement) was discussed.          This information has been scribed by Karie Fetch RN, BSN,BC.  Raivyn Kabler G 11/05/2015, 9:25 AM

## 2015-11-12 NOTE — Interval H&P Note (Signed)
History and Physical Interval Note:  11/12/2015 9:16 AM  Elizabeth Flynn  has presented today for surgery, with the diagnosis of history right breast cancer  The various methods of treatment have been discussed with the patient and family. After consideration of risks, benefits and other options for treatment, the patient has consented to  Procedure(s): TOTAL MASTECTOMY (Left) INSERTION PORT-A-CATH (N/A) as a surgical intervention .  The patient's history has been reviewed, patient examined, no change in status, stable for surgery.  I have reviewed the patient's chart and labs.  Questions were answered to the patient's satisfaction.     SANKAR,SEEPLAPUTHUR G

## 2015-11-12 NOTE — Op Note (Signed)
Preop diagnosis: History of right breast cancer Deficiency anemia  Post op diagnosis: Same  Operation: 1. insertion of venous access port with ultrasound and fluoroscopic guidance 2. Prophylactic left total mastectomy  Surgeon: Mckinley Jewel  Assistant:     Anesthesia: Gen.  Complications: None  EBL: About 50 mL  Drains: 2 Jackson-Pratt drains  Description: Patient was brought the operating room and placed supine anesthesia was obtained with the placement of endotracheal tube. Port placement was done first and accordingly the right upper chest and neck area were prepped and draped as sterile field. Timeout was performed. Ultrasound probe was brought up in the subclavian vein was identified beneath the lateral end of the clavicle. Small 1 cm incision was made at this site and needle successfully positioned in the subclavian vein using ultrasound guidance. Seldinger technique was then utilized to place a catheter going into the distal  SVC. Fluoroscopy was used to guide this. A subcutaneous pocket was created along the second interspace area. The subcutaneous days pocket was created with the use of cautery. The catheter was tunneled through through the port site and cut to approximate length and then fixed to the prefilled port. The port was then positioned in the pocket and anchored down to the fascia with 3 stitches of 2-0 Prolene. Fluoroscopy was repeated to ensure a smooth course of the catheter and with the end in the distal SVC. The port was flushed through with heparinized saline. Subcutaneous days tissue was closed 3-0 Vicryl and the skin with subcuticular 4-0 Vicryl both incisions were covered with the liquid ban. Following the above procedure the drapes removed and with the repair. Prepping the left breast the area was draped as a sterile field. A standard elliptical incision from medial to lateral aspect was mapped out. The plasma blade was utilized to make the incision in the superior  inferior flaps were then created using the plasma blade for dissection and control of bleeding. The breast tissue extended all the way up to the infraclavicular space and well into the upper rectus fascia. After the flaps were created in the breast tissue was dissected off from the underlying pectoral muscle from the medial to lateral aspect. And then removed, the lateral end was tagged with nylon stitch and sent to pathology in formalin. Bleeders were coagulated or ligated with the 3-0 Vicryl. The wound was irrigated with some saline and after ensuring hemostasis 2 Jackson-Pratt drains were placed. One was positioned along the medial aspect and 1 lateral towards the axillary area. Drains were fastened to the skin of the hanging 2-0 nylon stitch. The subcutaneous tissue was approximated with interrupted stitches of 2-0 Vicryl. Skin was closed with the 2 running subcuticular stitches of 3-0 Monocryl. Liquid ban was applied and separate dressing placed around the drains. Main incision was covered with Telfa and fluffs and held in place with a surgical bra. Patient subsequently was extubated and returned recovery room stable condition

## 2015-11-12 NOTE — Anesthesia Postprocedure Evaluation (Signed)
Anesthesia Post Note  Patient: Elizabeth Flynn  Procedure(s) Performed: Procedure(s) (LRB): TOTAL MASTECTOMY (Left) INSERTION PORT-A-CATH (Right)  Patient location during evaluation: PACU Anesthesia Type: General Level of consciousness: awake and alert and oriented Pain management: pain level controlled Vital Signs Assessment: post-procedure vital signs reviewed and stable Respiratory status: spontaneous breathing Cardiovascular status: blood pressure returned to baseline Anesthetic complications: no    Last Vitals:  Vitals:   11/12/15 1330 11/12/15 1335  BP:  (!) 164/74  Pulse: 91 86  Resp: 20 12  Temp:      Last Pain:  Vitals:   11/12/15 1330  TempSrc:   PainSc: 3                  Xzander Gilham

## 2015-11-12 NOTE — Discharge Instructions (Signed)

## 2015-11-12 NOTE — Transfer of Care (Signed)
Immediate Anesthesia Transfer of Care Note  Patient: Elizabeth Flynn  Procedure(s) Performed: Procedure(s): TOTAL MASTECTOMY (Left) INSERTION PORT-A-CATH (Right)  Patient Location: PACU  Anesthesia Type:General  Level of Consciousness: sedated and responds to stimulation  Airway & Oxygen Therapy: Patient Spontanous Breathing and Patient connected to face mask oxygen  Post-op Assessment: Report given to RN and Post -op Vital signs reviewed and stable  Post vital signs: Reviewed and stable  Last Vitals:  Vitals:   11/12/15 1236 11/12/15 1237  BP:  (!) 145/101  Pulse:  (!) 101  Resp:  14  Temp: 36.2 C     Last Pain:  Vitals:   11/12/15 0916  TempSrc: Oral         Complications: No apparent anesthesia complications

## 2015-11-12 NOTE — Anesthesia Procedure Notes (Signed)
Procedure Name: Intubation Performed by: Lance Muss Pre-anesthesia Checklist: Patient identified, Patient being monitored, Timeout performed, Emergency Drugs available and Suction available Patient Re-evaluated:Patient Re-evaluated prior to inductionOxygen Delivery Method: Circle system utilized Preoxygenation: Pre-oxygenation with 100% oxygen Intubation Type: IV induction Ventilation: Two handed mask ventilation required, Oral airway inserted - appropriate to patient size and Mask ventilation without difficulty Laryngoscope Size: Mac and 3 Grade View: Grade I Tube type: Oral Tube size: 7.0 mm Number of attempts: 1 Airway Equipment and Method: Stylet Placement Confirmation: ETT inserted through vocal cords under direct vision,  positive ETCO2 and breath sounds checked- equal and bilateral Secured at: 21 cm Tube secured with: Tape Dental Injury: Teeth and Oropharynx as per pre-operative assessment

## 2015-11-12 NOTE — Anesthesia Preprocedure Evaluation (Signed)
Anesthesia Evaluation  Patient identified by MRN, date of birth, ID band Patient awake    Reviewed: Allergy & Precautions, NPO status , Patient's Chart, lab work & pertinent test results  History of Anesthesia Complications (+) PONV and history of anesthetic complications  Airway Mallampati: I  TM Distance: >3 FB     Dental no notable dental hx.    Pulmonary shortness of breath and with exertion, former smoker,    Pulmonary exam normal        Cardiovascular hypertension, Normal cardiovascular exam+ dysrhythmias      Neuro/Psych  Headaches, PSYCHIATRIC DISORDERS Anxiety Depression CVA, No Residual Symptoms    GI/Hepatic GERD  Medicated and Controlled,  Endo/Other    Renal/GU stones  negative genitourinary   Musculoskeletal negative musculoskeletal ROS (+)   Abdominal Normal abdominal exam  (+)   Peds negative pediatric ROS (+)  Hematology  (+) anemia ,   Anesthesia Other Findings   Reproductive/Obstetrics                             Anesthesia Physical Anesthesia Plan  ASA: I  Anesthesia Plan: General   Post-op Pain Management:    Induction: Intravenous  Airway Management Planned: Oral ETT  Additional Equipment:   Intra-op Plan:   Post-operative Plan: Extubation in OR  Informed Consent: I have reviewed the patients History and Physical, chart, labs and discussed the procedure including the risks, benefits and alternatives for the proposed anesthesia with the patient or authorized representative who has indicated his/her understanding and acceptance.   Dental advisory given  Plan Discussed with: CRNA and Surgeon  Anesthesia Plan Comments:         Anesthesia Quick Evaluation

## 2015-11-13 ENCOUNTER — Telehealth: Payer: Self-pay | Admitting: *Deleted

## 2015-11-13 LAB — SURGICAL PATHOLOGY

## 2015-11-13 NOTE — Telephone Encounter (Signed)
-----   Message from Christene Lye, MD sent at 11/13/2015  4:23 PM EDT ----- Please let pt pt know the pathology was normal.

## 2015-11-13 NOTE — Telephone Encounter (Signed)
Notified patient as instructed, patient pleased. Discussed follow-up appointments, patient agrees  

## 2015-11-15 ENCOUNTER — Ambulatory Visit (INDEPENDENT_AMBULATORY_CARE_PROVIDER_SITE_OTHER): Payer: Medicaid Other | Admitting: General Surgery

## 2015-11-15 VITALS — BP 170/98 | HR 76 | Resp 14 | Ht 67.0 in | Wt 207.0 lb

## 2015-11-15 DIAGNOSIS — C50211 Malignant neoplasm of upper-inner quadrant of right female breast: Secondary | ICD-10-CM

## 2015-11-15 DIAGNOSIS — D509 Iron deficiency anemia, unspecified: Secondary | ICD-10-CM

## 2015-11-15 NOTE — Progress Notes (Signed)
Patient ID: Elizabeth Flynn, female   DOB: [birth year], 45 y.o.   MRN: [id number]  Chief Complaint  Patient presents with  . Routine Post Op    mactesctomy    HPI Elizabeth Flynn is a 45 y.o. female here today for her post op left mastectomy done on 11/12/15. Patient states she is doing well. Drain sheet present. I have reviewed the history of present illness with the patient.  HPI  Past Medical History:  Diagnosis Date  . Anemia   . Anxiety    takes nothing  . Blood transfusion without reported diagnosis 2016   patient has had 25 transfusions throughout her life. her blood levels run chronically low  . BRCA negative 10-16-14  . Breast cancer ([place]) 2016   RT MASTECTOMY  . Cancer of right breast ([place]) 10/16/2014   Upper and inner quadrant right breast, Invasive ductal carcinoma, Atypical Ductal Hyperplasia, ER/PR Pos, Her 2 Neg  . Cardiomyopathy ([place])   . Depression   . Diarrhea   . Dysrhythmia    TACHYCARDIA...happens randomly. not seen by any cardiologist  . Enlarged heart   . GERD (gastroesophageal reflux disease)    h/o- no meds since hernia surgery  . Headache   . Hypertension   . Kidney stones   . MRSA infection 2009  . Ovarian cyst   . Pancreatitis   . PONV (postoperative nausea and vomiting)    DRY HEAVING AFTER BREAST LUMPECTOMY 10-2014  . Shortness of breath dyspnea    with exertion  . Stroke [facility])    Sept. 2014 rt sided bleed  . Stroke [facility])     Past Surgical History:  Procedure Laterality Date  . ABDOMINAL HYSTERECTOMY    . BREAST BIOPSY Right 10/10/2014   Right breast invasive carcinoma. T1A, N0.  Marland Kitchen BREAST LUMPECTOMY WITH SENTINEL LYMPH NODE BIOPSY Right 10/23/2014   Procedure: Right breast lumpectomy with sentinel node biopsy ;  Surgeon: Christene Lye, MD;  Location: [facility] ORS;  Service: General;  Laterality: Right;  . CHOLECYSTECTOMY    . DILATATION & CURRETTAGE/HYSTEROSCOPY WITH RESECTOCOPE N/A 03/18/2015   Procedure: DILATATION &  CURETTAGE/HYSTEROSCOPY WITH RESECTOCOPE;  Surgeon: Brayton Mars, MD;  Location: [facility] ORS;  Service: Gynecology;  Laterality: N/A;  . HERNIA REPAIR    . LITHOTRIPSY    . MASTECTOMY Right 2016   BREAST CA  . MASTECTOMY MODIFIED RADICAL Right 12/05/2014   Procedure: MASTECTOMY MODIFIED RADICAL;  Surgeon: Christene Lye, MD;  Location: [facility] ORS;  Service: General;  Laterality: Right;  . OOPHORECTOMY Bilateral   . PORTACATH PLACEMENT Right 11/12/2015   Procedure: INSERTION PORT-A-CATH;  Surgeon: Christene Lye, MD;  Location: [facility] ORS;  Service: General;  Laterality: Right;  . TONSILLECTOMY    . TOTAL MASTECTOMY Left 11/12/2015   Procedure: TOTAL MASTECTOMY;  Surgeon: Christene Lye, MD;  Location: [facility] ORS;  Service: General;  Laterality: Left;  . TUBAL LIGATION    . VAGINAL HYSTERECTOMY Bilateral 04/29/2015   Procedure: HYSTERECTOMY VAGINAL/ BILATERAL SALPINGOOPHORECTOMY;  Surgeon: Brayton Mars, MD;  Location: [facility] ORS;  Service: Gynecology;  Laterality: Bilateral;    Family History  Problem Relation Age of Onset  . Breast cancer Cousin 70  . Heart disease Maternal Grandmother   . Diabetes Paternal Grandmother   . Heart disease Paternal Grandmother   . Ovarian cancer Neg Hx   . Colon cancer Neg Hx     Social History Social History  Substance Use Topics  . Smoking status: Former Smoker  Types: Cigarettes    Quit date: 04/26/1995  . Smokeless tobacco: Never Used     Comment: no passive smoke in home  . Alcohol use No     Comment: SOBER FOR 3 OMAYO(4599)    Allergies  Allergen Reactions  . Hydrocodone-Acetaminophen Hives and Itching  . Reglan [Metoclopramide] Other (See Comments)    "freaks me out, makes me nervous"  . Clindamycin/Lincomycin Rash  . Morphine And Related Rash    Current Outpatient Prescriptions  Medication Sig Dispense Refill  . acetaminophen (TYLENOL) 500 MG tablet Take 2 tablets (1,000 mg total) by mouth every 6 (six)  hours as needed. For pain 30 tablet 0  . amLODipine (NORVASC) 10 MG tablet Take 1 tablet (10 mg total) by mouth daily. (Patient taking differently: Take 10 mg by mouth every morning. ) 30 tablet 0  . aspirin 325 MG tablet Take 325 mg by mouth as needed.    . ferrous fumarate-iron polysaccharide complex (TANDEM) 162-115.2 MG CAPS capsule Take 1 capsule by mouth daily with breakfast. 30 capsule 6  . FLUoxetine (PROZAC) 20 MG capsule TAKE ONE TABLET BY MOUTH DAILY FOR DEPRESSION-am  5  . lamoTRIgine (LAMICTAL) 25 MG tablet Take 25 mg by mouth every morning.     Marland Kitchen lisinopril (PRINIVIL,ZESTRIL) 20 MG tablet Take 1 tablet (20 mg total) by mouth every evening. (Patient taking differently: Take 20 mg by mouth every morning. ) 30 tablet 0  . meloxicam (MOBIC) 7.5 MG tablet Take 1 tablet (7.5 mg total) by mouth daily. 30 tablet 0  . oxyCODONE (OXY IR/ROXICODONE) 5 MG immediate release tablet Take 1 tablet (5 mg total) by mouth every 6 (six) hours as needed for severe pain. 20 tablet 0  . promethazine (PHENERGAN) 12.5 MG suppository Place 1 suppository (12.5 mg total) rectally every 6 (six) hours as needed for nausea or vomiting. 12 each 0  . tamoxifen (NOLVADEX) 20 MG tablet TAKE 1 TABLET (20 MG TOTAL) BY MOUTH DAILY. 30 tablet 6  . venlafaxine (EFFEXOR) 37.5 MG tablet Take 37.5 mg by mouth every morning.     . vitamin C (ASCORBIC ACID) 500 MG tablet Take 500 mg by mouth daily.    Marland Kitchen zolpidem (AMBIEN) 5 MG tablet Take 1 tablet (5 mg total) by mouth at bedtime as needed for sleep. 15 tablet 0   No current facility-administered medications for this visit.     Review of Systems Review of Systems  Constitutional: Negative.   Respiratory: Negative.   Cardiovascular: Negative.     Blood pressure (!) 170/98, pulse 76, resp. rate 14, height 5' 7"  (1.702 m), weight 207 lb (93.9 kg), last menstrual period 03/26/2015.  Physical Exam Physical Exam Left mastectomy incision is clean and intact. Drains with  serosanguinous drainage.  No seroma, signs of infection. Venous port incision also clean and healing well Data Reviewed Path- benign findings in left breast  Assessment    Post op prophylactic left mastectomy.and venous port placement Doing well    Plan   Drains to remain till amount less than 43m/day    Patient to return in one week.   This information has been scribed by JGaspar ColaCMA.   Leone Putman G 11/16/2015, 9:15 AM

## 2015-11-15 NOTE — Patient Instructions (Signed)
Return in one week.  

## 2015-11-16 ENCOUNTER — Encounter: Payer: Self-pay | Admitting: General Surgery

## 2015-11-18 ENCOUNTER — Encounter: Payer: Self-pay | Admitting: General Surgery

## 2015-11-18 ENCOUNTER — Ambulatory Visit: Payer: Medicaid Other | Admitting: General Surgery

## 2015-11-20 ENCOUNTER — Encounter: Payer: Self-pay | Admitting: General Surgery

## 2015-11-20 ENCOUNTER — Ambulatory Visit (INDEPENDENT_AMBULATORY_CARE_PROVIDER_SITE_OTHER): Payer: Medicaid Other | Admitting: General Surgery

## 2015-11-20 VITALS — BP 146/86 | HR 82 | Resp 16 | Ht 67.0 in | Wt 206.0 lb

## 2015-11-20 DIAGNOSIS — C50211 Malignant neoplasm of upper-inner quadrant of right female breast: Secondary | ICD-10-CM

## 2015-11-20 DIAGNOSIS — D509 Iron deficiency anemia, unspecified: Secondary | ICD-10-CM

## 2015-11-20 NOTE — Progress Notes (Signed)
Patient ID: Elizabeth Flynn, female   DOB: 08/02/1970, 45 y.o.   MRN: [id number]  Chief Complaint  Patient presents with  . Routine Post Op    HPI Elizabeth Flynn is a 45 y.o. female here today for her post op left mastectomy done on 11/12/15. Patient states she is doing well. Drain sheet present and both drains are in place. I have reviewed the history of present illness with the patient.  HPI  Past Medical History:  Diagnosis Date  . Anemia   . Anxiety    takes nothing  . Blood transfusion without reported diagnosis 2016   patient has had 25 transfusions throughout her life. her blood levels run chronically low  . BRCA negative 10-16-14  . Breast cancer ([place]) 2016   RT MASTECTOMY  . Cancer of right breast ([place]) 10/16/2014   Upper and inner quadrant right breast, Invasive ductal carcinoma, Atypical Ductal Hyperplasia, ER/PR Pos, Her 2 Neg  . Cardiomyopathy ([place])   . Depression   . Diarrhea   . Dysrhythmia    TACHYCARDIA...happens randomly. not seen by any cardiologist  . Enlarged heart   . GERD (gastroesophageal reflux disease)    h/o- no meds since hernia surgery  . Headache   . Hypertension   . Kidney stones   . MRSA infection 2009  . Ovarian cyst   . Pancreatitis   . PONV (postoperative nausea and vomiting)    DRY HEAVING AFTER BREAST LUMPECTOMY 10-2014  . Shortness of breath dyspnea    with exertion  . Stroke [facility])    Sept. 2014 rt sided bleed  . Stroke [facility])     Past Surgical History:  Procedure Laterality Date  . ABDOMINAL HYSTERECTOMY    . BREAST BIOPSY Right 10/10/2014   Right breast invasive carcinoma. T1A, N0.  Marland Kitchen BREAST LUMPECTOMY WITH SENTINEL LYMPH NODE BIOPSY Right 10/23/2014   Procedure: Right breast lumpectomy with sentinel node biopsy ;  Surgeon: Christene Lye, MD;  Location: [facility] ORS;  Service: General;  Laterality: Right;  . CHOLECYSTECTOMY    . DILATATION & CURRETTAGE/HYSTEROSCOPY WITH RESECTOCOPE N/A 03/18/2015   Procedure: DILATATION  & CURETTAGE/HYSTEROSCOPY WITH RESECTOCOPE;  Surgeon: Brayton Mars, MD;  Location: [facility] ORS;  Service: Gynecology;  Laterality: N/A;  . HERNIA REPAIR    . LITHOTRIPSY    . MASTECTOMY Right 2016   BREAST CA  . MASTECTOMY MODIFIED RADICAL Right 12/05/2014   Procedure: MASTECTOMY MODIFIED RADICAL;  Surgeon: Christene Lye, MD;  Location: [facility] ORS;  Service: General;  Laterality: Right;  . OOPHORECTOMY Bilateral   . PORTACATH PLACEMENT Right 11/12/2015   Procedure: INSERTION PORT-A-CATH;  Surgeon: Christene Lye, MD;  Location: [facility] ORS;  Service: General;  Laterality: Right;  . TONSILLECTOMY    . TOTAL MASTECTOMY Left 11/12/2015   Procedure: TOTAL MASTECTOMY;  Surgeon: Christene Lye, MD;  Location: [facility] ORS;  Service: General;  Laterality: Left;  . TUBAL LIGATION    . VAGINAL HYSTERECTOMY Bilateral 04/29/2015   Procedure: HYSTERECTOMY VAGINAL/ BILATERAL SALPINGOOPHORECTOMY;  Surgeon: Brayton Mars, MD;  Location: [facility] ORS;  Service: Gynecology;  Laterality: Bilateral;    Family History  Problem Relation Age of Onset  . Breast cancer Cousin 51  . Heart disease Maternal Grandmother   . Diabetes Paternal Grandmother   . Heart disease Paternal Grandmother   . Ovarian cancer Neg Hx   . Colon cancer Neg Hx     Social History Social History  Substance Use Topics  . Smoking status:  Former Smoker    Types: Cigarettes    Quit date: 04/26/1995  . Smokeless tobacco: Never Used     Comment: no passive smoke in home  . Alcohol use No     Comment: SOBER FOR 3 ZYSAY(3016)    Allergies  Allergen Reactions  . Hydrocodone-Acetaminophen Hives and Itching  . Reglan [Metoclopramide] Other (See Comments)    "freaks me out, makes me nervous"  . Clindamycin/Lincomycin Rash  . Morphine And Related Rash    Current Outpatient Prescriptions  Medication Sig Dispense Refill  . acetaminophen (TYLENOL) 500 MG tablet Take 2 tablets (1,000 mg total) by mouth every 6 (six)  hours as needed. For pain 30 tablet 0  . amLODipine (NORVASC) 10 MG tablet Take 1 tablet (10 mg total) by mouth daily. (Patient taking differently: Take 10 mg by mouth every morning. ) 30 tablet 0  . aspirin 325 MG tablet Take 325 mg by mouth as needed.    . ferrous fumarate-iron polysaccharide complex (TANDEM) 162-115.2 MG CAPS capsule Take 1 capsule by mouth daily with breakfast. 30 capsule 6  . FLUoxetine (PROZAC) 20 MG capsule TAKE ONE TABLET BY MOUTH DAILY FOR DEPRESSION-am  5  . lamoTRIgine (LAMICTAL) 25 MG tablet Take 25 mg by mouth every morning.     Marland Kitchen lisinopril (PRINIVIL,ZESTRIL) 20 MG tablet Take 1 tablet (20 mg total) by mouth every evening. (Patient taking differently: Take 20 mg by mouth every morning. ) 30 tablet 0  . meloxicam (MOBIC) 7.5 MG tablet Take 1 tablet (7.5 mg total) by mouth daily. 30 tablet 0  . oxyCODONE (OXY IR/ROXICODONE) 5 MG immediate release tablet Take 1 tablet (5 mg total) by mouth every 6 (six) hours as needed for severe pain. 20 tablet 0  . promethazine (PHENERGAN) 12.5 MG suppository Place 1 suppository (12.5 mg total) rectally every 6 (six) hours as needed for nausea or vomiting. 12 each 0  . tamoxifen (NOLVADEX) 20 MG tablet TAKE 1 TABLET (20 MG TOTAL) BY MOUTH DAILY. 30 tablet 6  . venlafaxine (EFFEXOR) 37.5 MG tablet Take 37.5 mg by mouth every morning.     . vitamin C (ASCORBIC ACID) 500 MG tablet Take 500 mg by mouth daily.    Marland Kitchen zolpidem (AMBIEN) 5 MG tablet Take 1 tablet (5 mg total) by mouth at bedtime as needed for sleep. 15 tablet 0   No current facility-administered medications for this visit.     Review of Systems Review of Systems  Constitutional: Negative.   Respiratory: Negative.   Cardiovascular: Negative.     Blood pressure (!) 146/86, pulse 82, resp. rate 16, height 5' 7"  (1.702 m), weight 206 lb (93.4 kg), last menstrual period 03/26/2015.  Physical Exam Physical Exam  Constitutional: She is oriented to person, place, and time.  She appears well-developed and well-nourished.  Pulmonary/Chest:    Neurological: She is alert and oriented to person, place, and time.  Skin: Skin is warm and dry.  Psychiatric: She has a normal mood and affect. Her behavior is normal.    Data Reviewed Prior notes, surgical pathology Drainage still large from both drains Assessment    Right invasive breast carcinoma Prophylactic left mastectomy.drains to remain. Pt advised to call if drainage amount gets less than 60m/day    Plan    Follow up in 2 eeks     Follow up in 2 weeks  This information has been scribed by MKarie FetchRN, BSN,BC.   SANKAR,SEEPLAPUTHUR G 11/20/2015, 1:59 PM

## 2015-11-20 NOTE — Patient Instructions (Signed)
The patient is aware to call back for any questions or concerns.  

## 2015-11-26 ENCOUNTER — Ambulatory Visit (INDEPENDENT_AMBULATORY_CARE_PROVIDER_SITE_OTHER): Payer: Medicaid Other | Admitting: *Deleted

## 2015-11-26 DIAGNOSIS — C50211 Malignant neoplasm of upper-inner quadrant of right female breast: Secondary | ICD-10-CM

## 2015-11-26 NOTE — Progress Notes (Signed)
Patient came in today for a Drain check.  Bilateral drains removed per SGS.

## 2015-11-26 NOTE — Patient Instructions (Signed)
Patient to return as scheduled.  

## 2015-11-29 ENCOUNTER — Other Ambulatory Visit: Payer: Self-pay | Admitting: Oncology

## 2015-12-01 ENCOUNTER — Encounter: Payer: Self-pay | Admitting: Emergency Medicine

## 2015-12-01 ENCOUNTER — Emergency Department: Payer: Medicaid Other

## 2015-12-01 ENCOUNTER — Emergency Department
Admission: EM | Admit: 2015-12-01 | Discharge: 2015-12-02 | Disposition: A | Discharge status: Home / Self Care | Payer: Medicaid Other | Attending: Emergency Medicine | Admitting: Emergency Medicine

## 2015-12-01 DIAGNOSIS — G8918 Other acute postprocedural pain: Secondary | ICD-10-CM | POA: Insufficient documentation

## 2015-12-01 DIAGNOSIS — Z9012 Acquired absence of left breast and nipple: Secondary | ICD-10-CM | POA: Diagnosis not present

## 2015-12-01 DIAGNOSIS — I10 Essential (primary) hypertension: Secondary | ICD-10-CM | POA: Diagnosis not present

## 2015-12-01 DIAGNOSIS — Z79899 Other long term (current) drug therapy: Secondary | ICD-10-CM | POA: Diagnosis not present

## 2015-12-01 DIAGNOSIS — Z87891 Personal history of nicotine dependence: Secondary | ICD-10-CM | POA: Insufficient documentation

## 2015-12-01 DIAGNOSIS — Z853 Personal history of malignant neoplasm of breast: Secondary | ICD-10-CM | POA: Diagnosis not present

## 2015-12-01 DIAGNOSIS — R0789 Other chest pain: Secondary | ICD-10-CM

## 2015-12-01 DIAGNOSIS — Z791 Long term (current) use of non-steroidal anti-inflammatories (NSAID): Secondary | ICD-10-CM | POA: Diagnosis not present

## 2015-12-01 DIAGNOSIS — Z7982 Long term (current) use of aspirin: Secondary | ICD-10-CM | POA: Diagnosis not present

## 2015-12-01 LAB — CBC WITH DIFFERENTIAL/PLATELET
BASOS PCT: 1 %
Basophils Absolute: 0.1 10*3/uL (ref 0–0.1)
EOS ABS: 0.3 10*3/uL (ref 0–0.7)
EOS PCT: 4 %
HCT: 29.3 % — ABNORMAL LOW (ref 35.0–47.0)
HEMOGLOBIN: 9.2 g/dL — AB (ref 12.0–16.0)
Lymphocytes Relative: 37 %
Lymphs Abs: 3.2 10*3/uL (ref 1.0–3.6)
MCH: 20.5 pg — ABNORMAL LOW (ref 26.0–34.0)
MCHC: 31.3 g/dL — AB (ref 32.0–36.0)
MCV: 65.6 fL — ABNORMAL LOW (ref 80.0–100.0)
MONO ABS: 0.8 10*3/uL (ref 0.2–0.9)
MONOS PCT: 9 %
NEUTROS PCT: 49 %
Neutro Abs: 4.2 10*3/uL (ref 1.4–6.5)
PLATELETS: 297 10*3/uL (ref 150–440)
RBC: 4.46 MIL/uL (ref 3.80–5.20)
RDW: 19.9 % — AB (ref 11.5–14.5)
WBC: 8.6 10*3/uL (ref 3.6–11.0)

## 2015-12-01 LAB — BASIC METABOLIC PANEL
Anion gap: 7 (ref 5–15)
BUN: 20 mg/dL (ref 6–20)
CALCIUM: 9.1 mg/dL (ref 8.9–10.3)
CO2: 24 mmol/L (ref 22–32)
CREATININE: 0.79 mg/dL (ref 0.44–1.00)
Chloride: 107 mmol/L (ref 101–111)
Glucose, Bld: 98 mg/dL (ref 65–99)
Potassium: 3.7 mmol/L (ref 3.5–5.1)
SODIUM: 138 mmol/L (ref 135–145)

## 2015-12-01 MED ORDER — KETOROLAC TROMETHAMINE 30 MG/ML IJ SOLN
15.0000 mg | INTRAMUSCULAR | Status: AC
  Administered 2015-12-01: 15 mg via INTRAVENOUS
  Filled 2015-12-01: qty 1

## 2015-12-01 NOTE — ED Triage Notes (Signed)
11/12/15- Left Mastectomy by Dr. Jamal Collin.  JP x 2 removed 8/22. Since drains removed, patient states she is having fluid collection to mid - low left chest area.  Pain is worsening.

## 2015-12-01 NOTE — ED Notes (Signed)
Patient has a port-a-cath and is a difficult peripheral stick.  No blood drawn in Triage.

## 2015-12-01 NOTE — ED Provider Notes (Signed)
[facility] Emergency Department Provider Note  ____________________________________________  Time seen: Approximately 11:25 PM  I have reviewed the triage vital signs and the nursing notes.   HISTORY  Chief Complaint Post-op Problem    HPI Elizabeth Flynn is a 45 y.o. female who complains of left-sided chest wall pain, gradual onset, worse with movement. Not pleuritic, not exertional. No associated shortness of breath fevers chills cough radiation vomiting or diaphoresis. She recently had a left-sided mastectomy done, JP drains were removed about 5 days ago. Pain is been gradually worsening since then. She feels that there may be a fluid collection. No alleviating factors.     Past Medical History:  Diagnosis Date  . Anemia   . Anxiety    takes nothing  . Blood transfusion without reported diagnosis 2016   patient has had 25 transfusions throughout her life. her blood levels run chronically low  . BRCA negative 10-16-14  . Breast cancer ([place]) 2016   RT MASTECTOMY  . Cancer of right breast ([place]) 10/16/2014   Upper and inner quadrant right breast, Invasive ductal carcinoma, Atypical Ductal Hyperplasia, ER/PR Pos, Her 2 Neg  . Cardiomyopathy ([place])   . Depression   . Diarrhea   . Dysrhythmia    TACHYCARDIA...happens randomly. not seen by any cardiologist  . Enlarged heart   . GERD (gastroesophageal reflux disease)    h/o- no meds since hernia surgery  . Headache   . Hypertension   . Kidney stones   . MRSA infection 2009  . Ovarian cyst   . Pancreatitis   . PONV (postoperative nausea and vomiting)    DRY HEAVING AFTER BREAST LUMPECTOMY 10-2014  . Shortness of breath dyspnea    with exertion  . Stroke [facility])    Sept. 2014 rt sided bleed  . Stroke [facility])      Patient Active Problem List   Diagnosis Date Noted  . Iron deficiency anemia 10/28/2015  . Status post vaginal hysterectomy 06/12/2015  . S/P total hysterectomy and BSO (bilateral  salpingo-oophorectomy) 05/01/2015  . Abnormal uterine bleeding (AUB) 04/29/2015  . Status post D&C 04/11/2015  . Cellulitis of chest wall 01/31/2015  . Blister of right chest wall with infection 01/30/2015  . Malignant neoplasm of midline of right breast ([place]) 10/16/2014  . Absolute anemia 12/07/2012  . Clinical depression 12/07/2012  . Cerebrovascular accident, old 12/07/2012  . Near syncope 12/07/2012  . BP (high blood pressure) 12/07/2012     Past Surgical History:  Procedure Laterality Date  . ABDOMINAL HYSTERECTOMY    . BREAST BIOPSY Right 10/10/2014   Right breast invasive carcinoma. T1A, N0.  Marland Kitchen BREAST LUMPECTOMY WITH SENTINEL LYMPH NODE BIOPSY Right 10/23/2014   Procedure: Right breast lumpectomy with sentinel node biopsy ;  Surgeon: Christene Lye, MD;  Location: [facility] ORS;  Service: General;  Laterality: Right;  . CHOLECYSTECTOMY    . DILATATION & CURRETTAGE/HYSTEROSCOPY WITH RESECTOCOPE N/A 03/18/2015   Procedure: DILATATION & CURETTAGE/HYSTEROSCOPY WITH RESECTOCOPE;  Surgeon: Brayton Mars, MD;  Location: [facility] ORS;  Service: Gynecology;  Laterality: N/A;  . HERNIA REPAIR    . LITHOTRIPSY    . MASTECTOMY Right 2016   BREAST CA  . MASTECTOMY MODIFIED RADICAL Right 12/05/2014   Procedure: MASTECTOMY MODIFIED RADICAL;  Surgeon: Christene Lye, MD;  Location: [facility] ORS;  Service: General;  Laterality: Right;  . OOPHORECTOMY Bilateral   . PORTACATH PLACEMENT Right 11/12/2015   Procedure: INSERTION PORT-A-CATH;  Surgeon: Christene Lye, MD;  Location:  [facility] ORS;  Service: General;  Laterality: Right;  . TONSILLECTOMY    . TOTAL MASTECTOMY Left 11/12/2015   Procedure: TOTAL MASTECTOMY;  Surgeon: Christene Lye, MD;  Location: [facility] ORS;  Service: General;  Laterality: Left;  . TUBAL LIGATION    . VAGINAL HYSTERECTOMY Bilateral 04/29/2015   Procedure: HYSTERECTOMY VAGINAL/ BILATERAL SALPINGOOPHORECTOMY;  Surgeon: Brayton Mars, MD;  Location: [facility]  ORS;  Service: Gynecology;  Laterality: Bilateral;     Prior to Admission medications   Medication Sig Start Date End Date Taking? Authorizing Provider  acetaminophen (TYLENOL) 500 MG tablet Take 2 tablets (1,000 mg total) by mouth every 6 (six) hours as needed. For pain 02/02/15   Gladstone Lighter, MD  amLODipine (NORVASC) 10 MG tablet Take 1 tablet (10 mg total) by mouth daily. Patient taking differently: Take 10 mg by mouth every morning.  02/02/15   Gladstone Lighter, MD  aspirin 325 MG tablet Take 325 mg by mouth as needed.    Historical Provider, MD  ferrous fumarate-iron polysaccharide complex (TANDEM) 162-115.2 MG CAPS capsule Take 1 capsule by mouth daily with breakfast. 09/27/15   Evlyn Kanner, NP  FLUoxetine (PROZAC) 20 MG capsule TAKE ONE TABLET BY MOUTH DAILY FOR DEPRESSION-am 07/23/15   Historical Provider, MD  lamoTRIgine (LAMICTAL) 25 MG tablet Take 25 mg by mouth every morning.     Historical Provider, MD  lisinopril (PRINIVIL,ZESTRIL) 20 MG tablet Take 1 tablet (20 mg total) by mouth every evening. Patient taking differently: Take 20 mg by mouth every morning.  02/02/15   Gladstone Lighter, MD  meloxicam (MOBIC) 7.5 MG tablet TAKE 1 TABLET (7.5 MG TOTAL) BY MOUTH DAILY. 11/29/15   Lloyd Huger, MD  oxyCODONE (OXY IR/ROXICODONE) 5 MG immediate release tablet Take 1 tablet (5 mg total) by mouth every 6 (six) hours as needed for severe pain. 11/12/15   Seeplaputhur Robinette Haines, MD  promethazine (PHENERGAN) 12.5 MG suppository Place 1 suppository (12.5 mg total) rectally every 6 (six) hours as needed for nausea or vomiting. 11/12/15   Seeplaputhur Robinette Haines, MD  tamoxifen (NOLVADEX) 20 MG tablet TAKE 1 TABLET (20 MG TOTAL) BY MOUTH DAILY. 03/28/15   Forest Gleason, MD  venlafaxine (EFFEXOR) 37.5 MG tablet Take 37.5 mg by mouth every morning.     Historical Provider, MD  vitamin C (ASCORBIC ACID) 500 MG tablet Take 500 mg by mouth daily.    Historical Provider, MD  zolpidem (AMBIEN) 5  MG tablet Take 1 tablet (5 mg total) by mouth at bedtime as needed for sleep. 09/26/15   Evlyn Kanner, NP     Allergies Hydrocodone-acetaminophen; Reglan [metoclopramide]; Clindamycin/lincomycin; and Morphine and related   Family History  Problem Relation Age of Onset  . Breast cancer Cousin 92  . Heart disease Maternal Grandmother   . Diabetes Paternal Grandmother   . Heart disease Paternal Grandmother   . Ovarian cancer Neg Hx   . Colon cancer Neg Hx     Social History Social History  Substance Use Topics  . Smoking status: Former Smoker    Types: Cigarettes    Quit date: 04/26/1995  . Smokeless tobacco: Never Used     Comment: no passive smoke in home  . Alcohol use No     Comment: SOBER FOR 3 [id number])    Review of Systems  Constitutional:   No fever or chills.  ENT:   No sore throat. No rhinorrhea. Cardiovascular:   Positive as above for chest wall pain. Respiratory:  No dyspnea or cough. Gastrointestinal:   Negative for abdominal pain, vomiting and diarrhea.   10-point ROS otherwise negative.  ____________________________________________   PHYSICAL EXAM:  VITAL SIGNS: ED Triage Vitals  Enc Vitals Group     BP 12/01/15 1825 (!) 146/80     Pulse Rate 12/01/15 1825 73     Resp 12/01/15 1825 16     Temp 12/01/15 1825 98.4 F (36.9 C)     Temp Source 12/01/15 1825 Oral     SpO2 12/01/15 1825 99 %     Weight 12/01/15 1827 180 lb (81.6 kg)     Height 12/01/15 1827 5' 7"  (1.702 m)     Head Circumference --      Peak Flow --      Pain Score --      Pain Loc --      Pain Edu? --      Excl. in [place]? --     Vital signs reviewed, nursing assessments reviewed.   Constitutional:   Alert and oriented. Well appearing and in no distress. Eyes:   No scleral icterus. No conjunctival pallor. PERRL. EOMI.  No nystagmus. ENT   Head:   Normocephalic and atraumatic.   Nose:   No congestion/rhinnorhea. No septal hematoma   Mouth/Throat:   MMM, no  pharyngeal erythema. No peritonsillar mass.    Neck:   No stridor. No SubQ emphysema. No meningismus. Hematological/Lymphatic/Immunilogical:   No cervical lymphadenopathy. Cardiovascular:   RRR. Symmetric bilateral radial and DP pulses.  No murmurs.  Respiratory:   Normal respiratory effort without tachypnea nor retractions. Breath sounds are clear and equal bilaterally. No wheezes/rales/rhonchi.Chest wall mildly tender in the area of the left pectoralis. There is a well-healed surgical incision without drainage or inflammatory changes. No appreciable fluid collection, no induration or fluctuance. No drainage. Gastrointestinal:   Soft and nontender. Non distended. There is no CVA tenderness.  No rebound, rigidity, or guarding. Genitourinary:   deferred Musculoskeletal:   Nontender with normal range of motion in all extremities. No joint effusions.  No lower extremity tenderness.  No edema. Neurologic:   Normal speech and language.  CN 2-10 normal. Motor grossly intact. No gross focal neurologic deficits are appreciated.  Skin:    Skin is warm, dry and intact. No rash noted.  No petechiae, purpura, or bullae.  ____________________________________________    LABS (pertinent positives/negatives) (all labs ordered are listed, but only abnormal results are displayed) Labs Reviewed  CBC WITH DIFFERENTIAL/PLATELET - Abnormal; Notable for the following:       Result Value   Hemoglobin 9.2 (*)    HCT 29.3 (*)    MCV 65.6 (*)    MCH 20.5 (*)    MCHC 31.3 (*)    RDW 19.9 (*)    All other components within normal limits  BASIC METABOLIC PANEL   ____________________________________________   EKG    ____________________________________________    RADIOLOGY  Chest x-ray unremarkable  ____________________________________________   PROCEDURES Procedures  ____________________________________________   INITIAL IMPRESSION / ASSESSMENT AND PLAN / ED COURSE  Pertinent labs &  imaging results that were available during my care of the patient were reviewed by me and considered in my medical decision making (see chart for details).  Patient well appearing no acute distress. Vital signs labs chest x-ray all unremarkable. Presents with chest wall pain in the area of recent surgery. No evidence of any soft tissue infection surgical dehiscence or abscess. No evidence of seroma. We'll control pain, prescribe  her pre-course of oxycodone, follow up with Dr. Jamal Collin.     Clinical Course   ____________________________________________   FINAL CLINICAL IMPRESSION(S) / ED DIAGNOSES  Final diagnoses:  Post-operative pain  Chest wall pain       Portions of this note were generated with dragon dictation software. Dictation errors may occur despite best attempts at proofreading.    Carrie Mew, MD 12/02/15 [phone]

## 2015-12-02 MED ORDER — OXYCODONE-ACETAMINOPHEN 5-325 MG PO TABS
1.0000 | ORAL_TABLET | Freq: Once | ORAL | Status: AC
  Administered 2015-12-02: 1 via ORAL
  Filled 2015-12-02: qty 1

## 2015-12-02 MED ORDER — OXYCODONE HCL 5 MG PO TABS: 5.0000 mg | ORAL_TABLET | Freq: Four times a day (QID) | ORAL | 0 refills | Status: DC | PRN

## 2015-12-02 MED ORDER — HEPARIN SOD (PORK) LOCK FLUSH 100 UNIT/ML IV SOLN
INTRAVENOUS | Status: AC
  Filled 2015-12-02: qty 5

## 2015-12-02 NOTE — Discharge Instructions (Signed)
Pain Medicine Instructions °HOW CAN PAIN MEDICINE AFFECT ME? °You were given a prescription for pain medicine. This medicine may make you tired or drowsy and may affect your ability to think clearly. Pain medicine may also affect your ability to drive or perform certain physical activities. It may not be possible to make all of your pain go away, but you should be comfortable enough to move, breathe, and take care of yourself. °HOW OFTEN SHOULD I TAKE PAIN MEDICINE AND HOW MUCH SHOULD I TAKE? °Take pain medicine only as directed by your health care provider and only as needed for pain. °You do not need to take pain medicine if you are not having pain, unless directed by your health care provider. °You can take less than the prescribed dose if you find that a smaller amount of medicine controls your pain. °WHAT RESTRICTIONS DO I HAVE WHILE TAKING PAIN MEDICINE? °Follow these instructions after you start taking pain medicine, while you are taking the medicine, and for 8 hours after you stop taking the medicine: °Do not drive. °Do not operate machinery. °Do not operate power tools. °Do not sign legal documents. °Do not drink alcohol. °Do not take sleeping pills. °Do not supervise children by yourself. °Do not participate in activities that require climbing or being in high places. °Do not enter a body of water--such as a lake, river, ocean, spa, or swimming pool--without an adult nearby who can monitor and help you. °HOW CAN I KEEP OTHERS SAFE WHILE I AM TAKING PAIN MEDICINE? °Store your pain medicine as directed by your health care provider. Make sure that it is placed where children and pets cannot reach it. °Never share your pain medicine with anyone. °Do not save any leftover pills. If you have any leftover pain medicine, get rid of it or destroy it as directed by your health care provider. °WHAT ELSE DO I NEED TO KNOW ABOUT TAKING PAIN MEDICINE? °Use a stool softener if you become constipated from your pain  medicine. Increasing your intake of fruits and vegetables will also help with constipation. °Write down the times when you take your pain medicine. Look at the times before you take your next dose of medicine. It is easy to become confused while on pain medicine. Recording the times helps you to avoid an overdose. °If your pain is severe, do not try to treat it yourself by taking more pills than instructed on your prescription. Contact your health care provider for help. °You may have been prescribed a pain medicine that contains acetaminophen. Do not take any other acetaminophen while taking this medicine. An overdose of acetaminophen can result in severe liver damage. Acetaminophen is found in many over-the-counter (OTC) and prescription medicines. If you are taking any medicines in addition to your pain medicine, check the active ingredients on those medicines to see if acetaminophen is listed. °WHEN SHOULD I CALL MY HEALTH CARE PROVIDER? °Your medicine is not helping to make the pain go away. °You vomit or have diarrhea shortly after taking the medicine. °You develop new pain in areas that did not hurt before. °You have an allergic reaction to your medicine. This may include: °Itchiness. °Swelling. °Dizziness. °Developing a new rash. °WHEN SHOULD I CALL 911 OR GO TO THE EMERGENCY ROOM? °You feel dizzy or you faint. °You are very confused or disoriented. °You repeatedly vomit. °Your skin or lips turn pale or bluish in color. °You have shortness of breath or you are breathing much more slowly than usual. °You have   a severe allergic reaction to your medicine. This includes: °Developing tongue swelling. °Having difficulty breathing. °  °This information is not intended to replace advice given to you by your health care provider. Make sure you discuss any questions you have with your health care provider. °  °Document Released: 06/29/2000 Document Revised: 08/07/2014 Document Reviewed: 01/25/2014 °Elsevier  Interactive Patient Education ©2016 [employer] ° °

## 2015-12-02 NOTE — ED Notes (Signed)
Discharge instructions reviewed with patient. Patient verbalized understanding. Patient ambulated to lobby without difficulty.   

## 2015-12-05 ENCOUNTER — Ambulatory Visit (INDEPENDENT_AMBULATORY_CARE_PROVIDER_SITE_OTHER): Payer: Medicaid Other | Admitting: General Surgery

## 2015-12-05 ENCOUNTER — Encounter: Payer: Self-pay | Admitting: General Surgery

## 2015-12-05 VITALS — BP 148/72 | HR 84 | Resp 16 | Ht 67.0 in | Wt 180.0 lb

## 2015-12-05 DIAGNOSIS — C50211 Malignant neoplasm of upper-inner quadrant of right female breast: Secondary | ICD-10-CM

## 2015-12-05 DIAGNOSIS — T792XXA Traumatic secondary and recurrent hemorrhage and seroma, initial encounter: Secondary | ICD-10-CM

## 2015-12-05 DIAGNOSIS — T888XXA Other specified complications of surgical and medical care, not elsewhere classified, initial encounter: Secondary | ICD-10-CM

## 2015-12-05 NOTE — Patient Instructions (Signed)
The patient is aware to call back for any questions or concerns.  

## 2015-12-05 NOTE — Progress Notes (Signed)
Patient ID: Elizabeth Flynn, female   DOB: [birth year], 45 y.o.   MRN: [id number]  Chief Complaint  Patient presents with  . Routine Post Op    bilateral mastectomy     HPI Elizabeth Flynn is a 45 y.o. female here today for her 2 week post op left mastectomy done on 11/12/15. Doing well, feels some fluid.  I have reviewed the history of present illness with the patient.  HPI  Past Medical History:  Diagnosis Date  . Anemia   . Anxiety    takes nothing  . Blood transfusion without reported diagnosis 2016   patient has had 25 transfusions throughout her life. her blood levels run chronically low  . BRCA negative 10-16-14  . Breast cancer ([place]) 2016   RT MASTECTOMY  . Cancer of right breast ([place]) 10/16/2014   Upper and inner quadrant right breast, Invasive ductal carcinoma, Atypical Ductal Hyperplasia, ER/PR Pos, Her 2 Neg  . Cardiomyopathy ([place])   . Depression   . Diarrhea   . Dysrhythmia    TACHYCARDIA...happens randomly. not seen by any cardiologist  . Enlarged heart   . GERD (gastroesophageal reflux disease)    h/o- no meds since hernia surgery  . Headache   . Hypertension   . Kidney stones   . MRSA infection 2009  . Ovarian cyst   . Pancreatitis   . PONV (postoperative nausea and vomiting)    DRY HEAVING AFTER BREAST LUMPECTOMY 10-2014  . Shortness of breath dyspnea    with exertion  . Stroke [facility])    Sept. 2014 rt sided bleed  . Stroke [facility])     Past Surgical History:  Procedure Laterality Date  . ABDOMINAL HYSTERECTOMY    . BREAST BIOPSY Right 10/10/2014   Right breast invasive carcinoma. T1A, N0.  Marland Kitchen BREAST LUMPECTOMY WITH SENTINEL LYMPH NODE BIOPSY Right 10/23/2014   Procedure: Right breast lumpectomy with sentinel node biopsy ;  Surgeon: Christene Lye, MD;  Location: [facility] ORS;  Service: General;  Laterality: Right;  . CHOLECYSTECTOMY    . DILATATION & CURRETTAGE/HYSTEROSCOPY WITH RESECTOCOPE N/A 03/18/2015   Procedure: DILATATION &  CURETTAGE/HYSTEROSCOPY WITH RESECTOCOPE;  Surgeon: Brayton Mars, MD;  Location: [facility] ORS;  Service: Gynecology;  Laterality: N/A;  . HERNIA REPAIR    . LITHOTRIPSY    . MASTECTOMY Right 2016   BREAST CA  . MASTECTOMY MODIFIED RADICAL Right 12/05/2014   Procedure: MASTECTOMY MODIFIED RADICAL;  Surgeon: Christene Lye, MD;  Location: [facility] ORS;  Service: General;  Laterality: Right;  . OOPHORECTOMY Bilateral   . PORTACATH PLACEMENT Right 11/12/2015   Procedure: INSERTION PORT-A-CATH;  Surgeon: Christene Lye, MD;  Location: [facility] ORS;  Service: General;  Laterality: Right;  . TONSILLECTOMY    . TOTAL MASTECTOMY Left 11/12/2015   Procedure: TOTAL MASTECTOMY;  Surgeon: Christene Lye, MD;  Location: [facility] ORS;  Service: General;  Laterality: Left;  . TUBAL LIGATION    . VAGINAL HYSTERECTOMY Bilateral 04/29/2015   Procedure: HYSTERECTOMY VAGINAL/ BILATERAL SALPINGOOPHORECTOMY;  Surgeon: Brayton Mars, MD;  Location: [facility] ORS;  Service: Gynecology;  Laterality: Bilateral;    Family History  Problem Relation Age of Onset  . Breast cancer Cousin 49  . Heart disease Maternal Grandmother   . Diabetes Paternal Grandmother   . Heart disease Paternal Grandmother   . Ovarian cancer Neg Hx   . Colon cancer Neg Hx     Social History Social History  Substance Use Topics  . Smoking status: Former  Smoker    Types: Cigarettes    Quit date: 04/26/1995  . Smokeless tobacco: Never Used     Comment: no passive smoke in home  . Alcohol use No     Comment: SOBER FOR 3 [id number])    Allergies  Allergen Reactions  . Hydrocodone-Acetaminophen Hives and Itching  . Reglan [Metoclopramide] Other (See Comments)    "freaks me out, makes me nervous"  . Clindamycin/Lincomycin Rash  . Morphine And Related Rash    Current Outpatient Prescriptions  Medication Sig Dispense Refill  . acetaminophen (TYLENOL) 500 MG tablet Take 2 tablets (1,000 mg total) by mouth every 6 (six)  hours as needed. For pain 30 tablet 0  . amLODipine (NORVASC) 10 MG tablet Take 1 tablet (10 mg total) by mouth daily. (Patient taking differently: Take 10 mg by mouth every morning. ) 30 tablet 0  . aspirin 325 MG tablet Take 325 mg by mouth as needed.    . ferrous fumarate-iron polysaccharide complex (TANDEM) 162-115.2 MG CAPS capsule Take 1 capsule by mouth daily with breakfast. 30 capsule 6  . FLUoxetine (PROZAC) 20 MG capsule TAKE ONE TABLET BY MOUTH DAILY FOR DEPRESSION-am  5  . lamoTRIgine (LAMICTAL) 25 MG tablet Take 25 mg by mouth every morning.     Marland Kitchen lisinopril (PRINIVIL,ZESTRIL) 20 MG tablet Take 1 tablet (20 mg total) by mouth every evening. (Patient taking differently: Take 20 mg by mouth every morning. ) 30 tablet 0  . meloxicam (MOBIC) 7.5 MG tablet TAKE 1 TABLET (7.5 MG TOTAL) BY MOUTH DAILY. 30 tablet 0  . oxyCODONE (ROXICODONE) 5 MG immediate release tablet Take 1 tablet (5 mg total) by mouth every 6 (six) hours as needed for breakthrough pain. 12 tablet 0  . promethazine (PHENERGAN) 12.5 MG suppository Place 1 suppository (12.5 mg total) rectally every 6 (six) hours as needed for nausea or vomiting. 12 each 0  . tamoxifen (NOLVADEX) 20 MG tablet TAKE 1 TABLET (20 MG TOTAL) BY MOUTH DAILY. 30 tablet 6  . venlafaxine (EFFEXOR) 37.5 MG tablet Take 37.5 mg by mouth every morning.     . vitamin C (ASCORBIC ACID) 500 MG tablet Take 500 mg by mouth daily.    Marland Kitchen zolpidem (AMBIEN) 5 MG tablet Take 1 tablet (5 mg total) by mouth at bedtime as needed for sleep. 15 tablet 0   No current facility-administered medications for this visit.     Review of Systems Review of Systems  Constitutional: Negative.   Respiratory: Negative.   Cardiovascular: Negative.     Blood pressure (!) 148/72, pulse 84, resp. rate 16, height 5' 7"  (1.702 m), weight 180 lb (81.6 kg), last menstrual period 03/26/2015.  Physical Exam Physical Exam  Constitutional: She is oriented to person, place, and time. She  appears well-developed and well-nourished.  Pulmonary/Chest:  Large seroma involving entire mastectomy flaps. No signs of infectiuon  330 ml drained left chest wall seroma.  Neurological: She is alert and oriented to person, place, and time.  Skin: Skin is warm and dry.  Psychiatric: Her behavior is normal.    Data Reviewed   Assessment    Right invasive breast carcinoma Prophylactic left mastectomy Left chest wall seroma.    Plan   Seroma cath placed and Ace bandage used for pressure over mastectomy site Follow up in one week.     This information has been scribed by Karie Fetch RN, BSN,BC.   SANKAR,SEEPLAPUTHUR G 12/05/2015, 4:09 PM

## 2015-12-11 ENCOUNTER — Encounter: Payer: Self-pay | Admitting: General Surgery

## 2015-12-11 ENCOUNTER — Ambulatory Visit (INDEPENDENT_AMBULATORY_CARE_PROVIDER_SITE_OTHER): Payer: Medicaid Other | Admitting: General Surgery

## 2015-12-11 VITALS — BP 138/84 | HR 70 | Resp 14 | Ht 67.0 in | Wt 203.0 lb

## 2015-12-11 DIAGNOSIS — C50211 Malignant neoplasm of upper-inner quadrant of right female breast: Secondary | ICD-10-CM

## 2015-12-11 DIAGNOSIS — IMO0001 Reserved for inherently not codable concepts without codable children: Secondary | ICD-10-CM

## 2015-12-11 DIAGNOSIS — T792XXS Traumatic secondary and recurrent hemorrhage and seroma, sequela: Secondary | ICD-10-CM

## 2015-12-11 DIAGNOSIS — T888XXS Other specified complications of surgical and medical care, not elsewhere classified, sequela: Secondary | ICD-10-CM

## 2015-12-11 NOTE — Patient Instructions (Signed)
The patient is aware to call back for any questions or concerns.  

## 2015-12-11 NOTE — Progress Notes (Signed)
Patient ID: Elizabeth Flynn, female   DOB: [birth year], 45 y.o.   MRN: [id number]  Chief Complaint  Patient presents with  . Routine Post Op    HPI [name] is a 45 y.o. female. female here today for her 2 week post op left mastectomy done on 11/12/15. Doing well, drain present.  I have reviewed the history of present illness with the patient.   HPI  Past Medical History:  Diagnosis Date  . Anemia   . Anxiety    takes nothing  . Blood transfusion without reported diagnosis 2016   patient has had 25 transfusions throughout her life. her blood levels run chronically low  . BRCA negative 10-16-14  . Breast cancer ([place]) 2016   RT MASTECTOMY  . Cancer of right breast ([place]) 10/16/2014   Upper and inner quadrant right breast, Invasive ductal carcinoma, Atypical Ductal Hyperplasia, ER/PR Pos, Her 2 Neg  . Cardiomyopathy ([place])   . Depression   . Diarrhea   . Dysrhythmia    TACHYCARDIA...happens randomly. not seen by any cardiologist  . Enlarged heart   . GERD (gastroesophageal reflux disease)    h/o- no meds since hernia surgery  . Headache   . Hypertension   . Kidney stones   . MRSA infection 2009  . Ovarian cyst   . Pancreatitis   . PONV (postoperative nausea and vomiting)    DRY HEAVING AFTER BREAST LUMPECTOMY 10-2014  . Shortness of breath dyspnea    with exertion  . Stroke [facility])    Sept. 2014 rt sided bleed  . Stroke [facility])     Past Surgical History:  Procedure Laterality Date  . ABDOMINAL HYSTERECTOMY    . BREAST BIOPSY Right 10/10/2014   Right breast invasive carcinoma. T1A, N0.  Marland Kitchen BREAST LUMPECTOMY WITH SENTINEL LYMPH NODE BIOPSY Right 10/23/2014   Procedure: Right breast lumpectomy with sentinel node biopsy ;  Surgeon: Christene Lye, MD;  Location: [facility] ORS;  Service: General;  Laterality: Right;  . CHOLECYSTECTOMY    . DILATATION & CURRETTAGE/HYSTEROSCOPY WITH RESECTOCOPE N/A 03/18/2015   Procedure: DILATATION & CURETTAGE/HYSTEROSCOPY WITH  RESECTOCOPE;  Surgeon: Brayton Mars, MD;  Location: [facility] ORS;  Service: Gynecology;  Laterality: N/A;  . HERNIA REPAIR    . LITHOTRIPSY    . MASTECTOMY Right 2016   BREAST CA  . MASTECTOMY MODIFIED RADICAL Right 12/05/2014   Procedure: MASTECTOMY MODIFIED RADICAL;  Surgeon: Christene Lye, MD;  Location: [facility] OR[facility];  Service: General;  Laterality: Right;  . OOPHORECTOMY Bilateral   . PORTACATH PLACEMENT Right 11/12/2015   Procedure: INSERTION PORT-A-CATH;  Surgeon: Christene Lye, MD;  Location: [facility] OR[facility];  Service: General;  Laterality: Right;  . TONSILLECTOMY    . TOTAL MASTECTOMY Left 11/12/2015   Procedure: TOTAL MASTECTOMY;  Surgeon: Christene Lye, MD;  Location: [facility];  Service: General;  Laterality: Left;  . TUBAL LIGATION    . VAGINAL HYSTERECTOMY Bilateral 04/29/2015   Procedure: HYSTERECTOMY VAGINAL/ BILATERAL SALPINGOOPHORECTOMY;  Surgeon: Brayton Mars, MD;  Location: [facility] ORS;  Service: Gynecology;  Laterality: Bilateral;    Family History  Problem Relation Age of Onset  . Breast cancer Cousin 75  . Heart disease Maternal Grandmother   . Diabetes Paternal Grandmother   . Heart disease Paternal Grandmother   . Ovarian cancer Neg Hx   . Colon cancer Neg Hx     Social History Social History  Substance Use Topics  . Smoking status: Former Smoker    Types:  Cigarettes    Quit date: 04/26/1995  . Smokeless tobacco: Never Used     Comment: no passive smoke in home  . Alcohol use No     Comment: SOBER FOR 3 [id number])    Allergies  Allergen Reactions  . Hydrocodone-Acetaminophen Hives and Itching  . Reglan [Metoclopramide] Other (See Comments)    "freaks me out, makes me nervous"  . Clindamycin/Lincomycin Rash  . Morphine And Related Rash    Current Outpatient Prescriptions  Medication Sig Dispense Refill  . acetaminophen (TYLENOL) 500 MG tablet Take 2 tablets (1,000 mg total) by mouth every 6 (six) hours as needed. For pain 30  tablet 0  . amLODipine (NORVASC) 10 MG tablet Take 1 tablet (10 mg total) by mouth daily. (Patient taking differently: Take 10 mg by mouth every morning. ) 30 tablet 0  . aspirin 325 MG tablet Take 325 mg by mouth as needed.    . ferrous fumarate-iron polysaccharide complex (TANDEM) 162-115.2 MG CAPS capsule Take 1 capsule by mouth daily with breakfast. 30 capsule 6  . FLUoxetine (PROZAC) 20 MG capsule TAKE ONE TABLET BY MOUTH DAILY FOR DEPRESSION-am  5  . lamoTRIgine (LAMICTAL) 25 MG tablet Take 25 mg by mouth every morning.     Marland Kitchen lisinopril (PRINIVIL,ZESTRIL) 20 MG tablet Take 1 tablet (20 mg total) by mouth every evening. (Patient taking differently: Take 20 mg by mouth every morning. ) 30 tablet 0  . meloxicam (MOBIC) 7.5 MG tablet TAKE 1 TABLET (7.5 MG TOTAL) BY MOUTH DAILY. 30 tablet 0  . oxyCODONE (ROXICODONE) 5 MG immediate release tablet Take 1 tablet (5 mg total) by mouth every 6 (six) hours as needed for breakthrough pain. 12 tablet 0  . promethazine (PHENERGAN) 12.5 MG suppository Place 1 suppository (12.5 mg total) rectally every 6 (six) hours as needed for nausea or vomiting. 12 each 0  . tamoxifen (NOLVADEX) 20 MG tablet TAKE 1 TABLET (20 MG TOTAL) BY MOUTH DAILY. 30 tablet 6  . venlafaxine (EFFEXOR) 37.5 MG tablet Take 37.5 mg by mouth every morning.     . vitamin C (ASCORBIC ACID) 500 MG tablet Take 500 mg by mouth daily.    Marland Kitchen zolpidem (AMBIEN) 5 MG tablet Take 1 tablet (5 mg total) by mouth at bedtime as needed for sleep. 15 tablet 0   No current facility-administered medications for this visit.     Review of Systems Review of Systems  Constitutional: Negative.   Respiratory: Negative.   Cardiovascular: Negative.     Blood pressure 138/84, pulse 70, resp. rate 14, height 5' 7" (1.702 m), weight 203 lb (92.1 kg), last menstrual period 03/26/2015.  Physical Exam Physical Exam  Constitutional: She is oriented to person, place, and time. She appears well-developed and  well-nourished.  Neurological: She is alert and oriented to person, place, and time.  Skin: Skin is warm and dry.  Psychiatric: Her behavior is normal.  Left mastectomy incision is clean and intact. S=eroma noted lateral aspect, whle lot smaller than 1 week ago Seroma cath in place. Fluid is serous.  Data Reviewed Prior notes  Assessment    Right invasive breast carcinoma Prophylactic left mastectomy Left chest wall seroma    Plan    Follow up in one week.       Rose Hegner G 12/11/2015, 11:24 AM

## 2015-12-18 ENCOUNTER — Other Ambulatory Visit: Payer: Self-pay | Admitting: *Deleted

## 2015-12-18 DIAGNOSIS — D508 Other iron deficiency anemias: Secondary | ICD-10-CM

## 2015-12-19 ENCOUNTER — Other Ambulatory Visit: Payer: Self-pay

## 2015-12-19 ENCOUNTER — Encounter: Payer: Self-pay | Admitting: General Surgery

## 2015-12-19 ENCOUNTER — Inpatient Hospital Stay: Payer: Medicaid Other | Attending: Oncology

## 2015-12-19 ENCOUNTER — Ambulatory Visit (INDEPENDENT_AMBULATORY_CARE_PROVIDER_SITE_OTHER): Payer: Medicaid Other | Admitting: General Surgery

## 2015-12-19 VITALS — BP 138/80 | HR 80 | Resp 16 | Ht 67.0 in | Wt 203.0 lb

## 2015-12-19 DIAGNOSIS — D508 Other iron deficiency anemias: Secondary | ICD-10-CM

## 2015-12-19 DIAGNOSIS — C50211 Malignant neoplasm of upper-inner quadrant of right female breast: Secondary | ICD-10-CM

## 2015-12-19 DIAGNOSIS — F419 Anxiety disorder, unspecified: Secondary | ICD-10-CM | POA: Insufficient documentation

## 2015-12-19 DIAGNOSIS — I10 Essential (primary) hypertension: Secondary | ICD-10-CM | POA: Insufficient documentation

## 2015-12-19 DIAGNOSIS — D509 Iron deficiency anemia, unspecified: Secondary | ICD-10-CM

## 2015-12-19 DIAGNOSIS — Z79899 Other long term (current) drug therapy: Secondary | ICD-10-CM | POA: Insufficient documentation

## 2015-12-19 DIAGNOSIS — Z170 Estrogen receptor positive status [ER+]: Secondary | ICD-10-CM | POA: Insufficient documentation

## 2015-12-19 DIAGNOSIS — Z8614 Personal history of Methicillin resistant Staphylococcus aureus infection: Secondary | ICD-10-CM | POA: Insufficient documentation

## 2015-12-19 DIAGNOSIS — Z9013 Acquired absence of bilateral breasts and nipples: Secondary | ICD-10-CM | POA: Insufficient documentation

## 2015-12-19 DIAGNOSIS — K219 Gastro-esophageal reflux disease without esophagitis: Secondary | ICD-10-CM | POA: Insufficient documentation

## 2015-12-19 NOTE — Progress Notes (Signed)
Patient ID: Elizabeth Flynn, female   DOB: Nov 18, 1970, 45 y.o.   MRN: [id number]  Chief Complaint  Patient presents with  . Routine Post Op    mastectomy    HPI Elizabeth Flynn is a 45 y.o. female here today for her post op bilateral mastectomy completed on 11/12/15. Patient states she is doing well. She is still having some fluid draining into her left JP drain. Reports it has been draining 60 to 80 ml for the last two days.   I have reviewed the history of present illness with the patient.  HPI    Past Surgical History:  Procedure Laterality Date  . ABDOMINAL HYSTERECTOMY    . BREAST BIOPSY Right 10/10/2014   Right breast invasive carcinoma. T1A, N0.  Marland Kitchen BREAST LUMPECTOMY WITH SENTINEL LYMPH NODE BIOPSY Right 10/23/2014   Procedure: Right breast lumpectomy with sentinel node biopsy ;  Surgeon: Christene Lye, MD;  Location: [facility] ORS;  Service: General;  Laterality: Right;  . CHOLECYSTECTOMY    . DILATATION & CURRETTAGE/HYSTEROSCOPY WITH RESECTOCOPE N/A 03/18/2015   Procedure: DILATATION & CURETTAGE/HYSTEROSCOPY WITH RESECTOCOPE;  Surgeon: Brayton Mars, MD;  Location: [facility] ORS;  Service: Gynecology;  Laterality: N/A;  . HERNIA REPAIR    . LITHOTRIPSY    . MASTECTOMY Right 2016   BREAST CA  . MASTECTOMY MODIFIED RADICAL Right 12/05/2014   Procedure: MASTECTOMY MODIFIED RADICAL;  Surgeon: Christene Lye, MD;  Location: [facility];  Service: General;  Laterality: Right;  . OOPHORECTOMY Bilateral   . PORTACATH PLACEMENT Right 11/12/2015   Procedure: INSERTION PORT-A-CATH;  Surgeon: Christene Lye, MD;  Location: [facility];  Service: General;  Laterality: Right;  . TONSILLECTOMY    . TOTAL MASTECTOMY Left 11/12/2015   Procedure: TOTAL MASTECTOMY;  Surgeon: Christene Lye, MD;  Location: [facility];  Service: General;  Laterality: Left;  . TUBAL LIGATION    . VAGINAL HYSTERECTOMY Bilateral 04/29/2015   Procedure: HYSTERECTOMY VAGINAL/ BILATERAL  SALPINGOOPHORECTOMY;  Surgeon: Brayton Mars, MD;  Location: [facility];  Service: Gynecology;  Laterality: Bilateral;    Family History  Problem Relation Age of Onset  . Breast cancer Cousin 78  . Heart disease Maternal Grandmother   . Diabetes Paternal Grandmother   . Heart disease Paternal Grandmother   . Ovarian cancer Neg Hx   . Colon cancer Neg Hx     Social History Social History  Substance Use Topics  . Smoking status: Former Smoker    Types: Cigarettes    Quit date: 04/26/1995  . Smokeless tobacco: Never Used     Comment: no passive smoke in home  . Alcohol use No     Comment: SOBER FOR 3 [id number])    Allergies  Allergen Reactions  . Hydrocodone-Acetaminophen Hives and Itching  . Reglan [Metoclopramide] Other (See Comments)    "freaks me out, makes me nervous"  . Clindamycin/Lincomycin Rash  . Morphine And Related Rash    Current Outpatient Prescriptions  Medication Sig Dispense Refill  . acetaminophen (TYLENOL) 500 MG tablet Take 2 tablets (1,000 mg total) by mouth every 6 (six) hours as needed. For pain 30 tablet 0  . amLODipine (NORVASC) 10 MG tablet Take 1 tablet (10 mg total) by mouth daily. (Patient taking differently: Take 10 mg by mouth every morning. ) 30 tablet 0  . aspirin 325 MG tablet Take 325 mg by mouth as needed.    . ferrous fumarate-iron polysaccharide complex (TANDEM) 162-115.2 MG CAPS capsule Take 1 capsule by  mouth daily with breakfast. 30 capsule 6  . FLUoxetine (PROZAC) 20 MG capsule TAKE ONE TABLET BY MOUTH DAILY FOR DEPRESSION-am  5  . lamoTRIgine (LAMICTAL) 25 MG tablet Take 25 mg by mouth every morning.     Marland Kitchen lisinopril (PRINIVIL,ZESTRIL) 20 MG tablet Take 1 tablet (20 mg total) by mouth every evening. (Patient taking differently: Take 20 mg by mouth every morning. ) 30 tablet 0  . meloxicam (MOBIC) 7.5 MG tablet TAKE 1 TABLET (7.5 MG TOTAL) BY MOUTH DAILY. 30 tablet 0  . oxyCODONE (ROXICODONE) 5 MG immediate release tablet Take 1  tablet (5 mg total) by mouth every 6 (six) hours as needed for breakthrough pain. 12 tablet 0  . promethazine (PHENERGAN) 12.5 MG suppository Place 1 suppository (12.5 mg total) rectally every 6 (six) hours as needed for nausea or vomiting. 12 each 0  . tamoxifen (NOLVADEX) 20 MG tablet TAKE 1 TABLET (20 MG TOTAL) BY MOUTH DAILY. 30 tablet 6  . venlafaxine (EFFEXOR) 37.5 MG tablet Take 37.5 mg by mouth every morning.     . vitamin C (ASCORBIC ACID) 500 MG tablet Take 500 mg by mouth daily.    Marland Kitchen zolpidem (AMBIEN) 5 MG tablet Take 1 tablet (5 mg total) by mouth at bedtime as needed for sleep. 15 tablet 0   No current facility-administered medications for this visit.     Review of Systems Review of Systems  Constitutional: Negative.   Respiratory: Negative.   Cardiovascular: Negative.     Blood pressure 138/80, pulse 80, resp. rate 16, height 5\' 7"  (1.702 m), weight 203 lb (92.1 kg), last menstrual period 03/26/2015.  Physical Exam Physical Exam  Constitutional: She is oriented to person, place, and time. She appears well-developed and well-nourished.  Pulmonary/Chest:    Neurological: She is alert and oriented to person, place, and time.  Skin: Skin is warm and dry.  Breast: Post bilateral mastectomy with trace edema about the left chest wall, seroma cath in place with no signs of infection at the insertion site.   Data Reviewed Notes reviewed   Assessment    Trace edema of chest wall status post mastectomy, improved from 1 week ago with adequate drainage from drain Plan      Will leave left seroma cath   in place and re-evaluate in one week.  This information has been scribed by Gaspar Cola CMA.   Nashaly Dorantes G 12/19/2015, 11:01 AM

## 2015-12-19 NOTE — Patient Instructions (Addendum)
Patient to return in one week. 

## 2015-12-20 ENCOUNTER — Inpatient Hospital Stay: Payer: Medicaid Other

## 2015-12-20 ENCOUNTER — Other Ambulatory Visit: Payer: Self-pay

## 2015-12-20 DIAGNOSIS — Z9013 Acquired absence of bilateral breasts and nipples: Secondary | ICD-10-CM | POA: Diagnosis not present

## 2015-12-20 DIAGNOSIS — Z95828 Presence of other vascular implants and grafts: Secondary | ICD-10-CM

## 2015-12-20 DIAGNOSIS — K219 Gastro-esophageal reflux disease without esophagitis: Secondary | ICD-10-CM | POA: Diagnosis not present

## 2015-12-20 DIAGNOSIS — F419 Anxiety disorder, unspecified: Secondary | ICD-10-CM | POA: Diagnosis not present

## 2015-12-20 DIAGNOSIS — C50211 Malignant neoplasm of upper-inner quadrant of right female breast: Secondary | ICD-10-CM | POA: Diagnosis not present

## 2015-12-20 DIAGNOSIS — I10 Essential (primary) hypertension: Secondary | ICD-10-CM | POA: Diagnosis not present

## 2015-12-20 DIAGNOSIS — Z170 Estrogen receptor positive status [ER+]: Secondary | ICD-10-CM | POA: Diagnosis not present

## 2015-12-20 DIAGNOSIS — Z79899 Other long term (current) drug therapy: Secondary | ICD-10-CM | POA: Diagnosis not present

## 2015-12-20 DIAGNOSIS — D509 Iron deficiency anemia, unspecified: Secondary | ICD-10-CM | POA: Diagnosis not present

## 2015-12-20 DIAGNOSIS — Z8614 Personal history of Methicillin resistant Staphylococcus aureus infection: Secondary | ICD-10-CM | POA: Diagnosis not present

## 2015-12-20 LAB — CBC WITH DIFFERENTIAL/PLATELET
BASOS ABS: 0.1 10*3/uL (ref 0–0.1)
BASOS PCT: 1 %
EOS ABS: 0.2 10*3/uL (ref 0–0.7)
Eosinophils Relative: 4 %
HCT: 26.2 % — ABNORMAL LOW (ref 35.0–47.0)
HEMOGLOBIN: 8.1 g/dL — AB (ref 12.0–16.0)
Lymphocytes Relative: 26 %
Lymphs Abs: 1.6 10*3/uL (ref 1.0–3.6)
MCH: 20.3 pg — ABNORMAL LOW (ref 26.0–34.0)
MCHC: 31 g/dL — AB (ref 32.0–36.0)
MCV: 65.5 fL — ABNORMAL LOW (ref 80.0–100.0)
MONOS PCT: 8 %
Monocytes Absolute: 0.5 10*3/uL (ref 0.2–0.9)
NEUTROS PCT: 61 %
Neutro Abs: 3.9 10*3/uL (ref 1.4–6.5)
Platelets: 273 10*3/uL (ref 150–440)
RBC: 3.99 MIL/uL (ref 3.80–5.20)
RDW: 18.8 % — ABNORMAL HIGH (ref 11.5–14.5)
WBC: 6.4 10*3/uL (ref 3.6–11.0)

## 2015-12-20 LAB — IRON AND TIBC
IRON: 10 ug/dL — AB (ref 28–170)
Saturation Ratios: 2 % — ABNORMAL LOW (ref 10.4–31.8)
TIBC: 548 ug/dL — ABNORMAL HIGH (ref 250–450)
UIBC: 538 ug/dL

## 2015-12-20 LAB — FERRITIN: FERRITIN: 5 ng/mL — AB (ref 11–307)

## 2015-12-20 MED ORDER — HEPARIN SOD (PORK) LOCK FLUSH 100 UNIT/ML IV SOLN
500.0000 [IU] | Freq: Once | INTRAVENOUS | Status: AC
  Administered 2015-12-20: 500 [IU] via INTRAVENOUS

## 2015-12-20 MED ORDER — SODIUM CHLORIDE 0.9% FLUSH
10.0000 mL | INTRAVENOUS | Status: DC | PRN
  Administered 2015-12-20: 10 mL via INTRAVENOUS
  Filled 2015-12-20: qty 10

## 2015-12-23 ENCOUNTER — Encounter: Payer: Self-pay | Admitting: General Surgery

## 2015-12-23 NOTE — Progress Notes (Signed)
[place]  [phone] [phone]) [phone]  ID: Elizabeth Flynn OB: March 04, 1971  MR#: [id number]  PRF#:[id number]  Patient Care Team: Janalyn Shy, MD as PCP - General (Family Medicine) Rico Junker, RN as Registered Nurse (Radiology) Theodore Demark, RN as Registered Nurse Seeplaputhur Robinette Haines, MD (General Surgery)  CHIEF COMPLAINT: Cancer midline right breast, iron deficiency anemia.  INTERVAL HISTORY: Patient returns to clinic today for further evaluation and consideration of IV iron. She recently underwent her prophylactic left mastectomy. She continues to have chest wall pain at the site of her surgery and has a follow-up appointment on Thursday with her surgeon. She continues to tolerate tamoxifen well without significant side effects. She does admit to persistent weakness and fatigue. She has no neurologic complaints. She denies any chest pain or shortness of breath. She denies any nausea, vomiting, constipation, or diarrhea. She has a history of rectal pain and bleeding. She has no urinary complaints. Patient offers no further specific complaints today.  REVIEW OF SYSTEMS:   Review of Systems  Constitutional: Positive for malaise/fatigue. Negative for fever and weight loss.  Respiratory: Positive for shortness of breath.   Cardiovascular: Negative.  Negative for chest pain.  Gastrointestinal: Positive for blood in stool. Negative for abdominal pain and melena.  Genitourinary: Negative.   Musculoskeletal: Negative.   Neurological: Positive for weakness.  Psychiatric/Behavioral: Negative.     As per HPI. Otherwise, a complete review of systems is negative.  PAST MEDICAL HISTORY: Past Medical History:  Diagnosis Date  . Anemia   . Anxiety    takes nothing  . Blood transfusion without reported diagnosis 2016   patient has had 25 transfusions throughout her life. her blood levels run chronically low  . BRCA negative 10-16-14  . Breast cancer  ([place]) 2016   RT MASTECTOMY  . Cancer of right breast ([place]) 10/16/2014   Upper and inner quadrant right breast, Invasive ductal carcinoma, Atypical Ductal Hyperplasia, ER/PR Pos, Her 2 Neg  . Cardiomyopathy ([place])   . Depression   . Diarrhea   . Dysrhythmia    TACHYCARDIA...happens randomly. not seen by any cardiologist  . Enlarged heart   . GERD (gastroesophageal reflux disease)    h/o- no meds since hernia surgery  . Headache   . Hypertension   . Kidney stones   . MRSA infection 2009  . Ovarian cyst   . Pancreatitis   . PONV (postoperative nausea and vomiting)    DRY HEAVING AFTER BREAST LUMPECTOMY 10-2014  . Shortness of breath dyspnea    with exertion  . Stroke [facility])    Sept. 2014 rt sided bleed  . Stroke [facility])     PAST SURGICAL HISTORY: Past Surgical History:  Procedure Laterality Date  . ABDOMINAL HYSTERECTOMY    . BREAST BIOPSY Right 10/10/2014   Right breast invasive carcinoma. T1A, N0.  Marland Kitchen BREAST LUMPECTOMY WITH SENTINEL LYMPH NODE BIOPSY Right 10/23/2014   Procedure: Right breast lumpectomy with sentinel node biopsy ;  Surgeon: Christene Lye, MD;  Location: [facility] ORS;  Service: General;  Laterality: Right;  . CHOLECYSTECTOMY    . DILATATION & CURRETTAGE/HYSTEROSCOPY WITH RESECTOCOPE N/A 03/18/2015   Procedure: DILATATION & CURETTAGE/HYSTEROSCOPY WITH RESECTOCOPE;  Surgeon: Brayton Mars, MD;  Location: [facility] ORS;  Service: Gynecology;  Laterality: N/A;  . HERNIA REPAIR    . LITHOTRIPSY    . MASTECTOMY Right 2016   BREAST CA  . MASTECTOMY MODIFIED RADICAL Right 12/05/2014   Procedure: MASTECTOMY MODIFIED RADICAL;  Surgeon: Christene Lye, MD;  Location: [facility] ORS;  Service: General;  Laterality: Right;  . OOPHORECTOMY Bilateral   . PORTACATH PLACEMENT Right 11/12/2015   Procedure: INSERTION PORT-A-CATH;  Surgeon: Christene Lye, MD;  Location: [facility] ORS;  Service: General;  Laterality: Right;  . TONSILLECTOMY    . TOTAL MASTECTOMY Left 11/12/2015     Procedure: TOTAL MASTECTOMY;  Surgeon: Christene Lye, MD;  Location: [facility] ORS;  Service: General;  Laterality: Left;  . TUBAL LIGATION    . VAGINAL HYSTERECTOMY Bilateral 04/29/2015   Procedure: HYSTERECTOMY VAGINAL/ BILATERAL SALPINGOOPHORECTOMY;  Surgeon: Brayton Mars, MD;  Location: [facility] ORS;  Service: Gynecology;  Laterality: Bilateral;    FAMILY HISTORY: Family History  Problem Relation Age of Onset  . Breast cancer Cousin 50  . Heart disease Maternal Grandmother   . Diabetes Paternal Grandmother   . Heart disease Paternal Grandmother   . Ovarian cancer Neg Hx   . Colon cancer Neg Hx        ADVANCED DIRECTIVES:    HEALTH MAINTENANCE: Social History  Substance Use Topics  . Smoking status: Former Smoker    Types: Cigarettes    Quit date: 04/26/1995  . Smokeless tobacco: Never Used     Comment: no passive smoke in home  . Alcohol use No     Comment: SOBER FOR 3 [id number](6045)     Colonoscopy:  PAP:  Bone density:  Lipid panel:  Allergies  Allergen Reactions  . Hydrocodone-Acetaminophen Hives and Itching  . Reglan [Metoclopramide] Other (See Comments)    "freaks me out, makes me nervous"  . Clindamycin/Lincomycin Rash  . Morphine And Related Rash    Current Outpatient Prescriptions  Medication Sig Dispense Refill  . acetaminophen (TYLENOL) 500 MG tablet Take 2 tablets (1,000 mg total) by mouth every 6 (six) hours as needed. For pain 30 tablet 0  . amLODipine (NORVASC) 10 MG tablet Take 1 tablet (10 mg total) by mouth daily. (Patient taking differently: Take 10 mg by mouth every morning. ) 30 tablet 0  . aspirin 325 MG tablet Take 325 mg by mouth as needed.    . ferrous fumarate-iron polysaccharide complex (TANDEM) 162-115.2 MG CAPS capsule Take 1 capsule by mouth daily with breakfast. 30 capsule 6  . FLUoxetine (PROZAC) 20 MG capsule TAKE ONE TABLET BY MOUTH DAILY FOR DEPRESSION-am  5  . lamoTRIgine (LAMICTAL) 25 MG tablet Take 25 mg by mouth  every morning.     Marland Kitchen lisinopril (PRINIVIL,ZESTRIL) 20 MG tablet Take 1 tablet (20 mg total) by mouth every evening. (Patient taking differently: Take 20 mg by mouth every morning. ) 30 tablet 0  . meloxicam (MOBIC) 7.5 MG tablet TAKE 1 TABLET (7.5 MG TOTAL) BY MOUTH DAILY. 30 tablet 0  . promethazine (PHENERGAN) 12.5 MG suppository Place 1 suppository (12.5 mg total) rectally every 6 (six) hours as needed for nausea or vomiting. 12 each 0  . tamoxifen (NOLVADEX) 20 MG tablet TAKE 1 TABLET (20 MG TOTAL) BY MOUTH DAILY. 30 tablet 6  . venlafaxine (EFFEXOR) 37.5 MG tablet Take 37.5 mg by mouth every morning.     . vitamin C (ASCORBIC ACID) 500 MG tablet Take 500 mg by mouth daily.    Marland Kitchen zolpidem (AMBIEN) 5 MG tablet Take 1 tablet (5 mg total) by mouth at bedtime as needed for sleep. 15 tablet 0   No current facility-administered medications for this visit.     OBJECTIVE: Vitals:   12/24/15 1044  BP: Marland Kitchen)  160/100  Pulse: 70  Resp: 18  Temp: (!) 96.4 F (35.8 C)     Body mass index is 31.99 kg/m.    ECOG FS:0 - Asymptomatic  General: Well-developed, well-nourished, no acute distress. Eyes: Pink conjunctiva, anicteric sclera. Breasts: Bilateral mastectomy. Lungs: Clear to auscultation bilaterally. Heart: Regular rate and rhythm. No rubs, murmurs, or gallops. Abdomen: Soft, nontender, nondistended. No organomegaly noted, normoactive bowel sounds. Musculoskeletal: No edema, cyanosis, or clubbing. Neuro: Alert, answering all questions appropriately. Cranial nerves grossly intact. Skin: No rashes or petechiae noted. Psych: Normal affect.   LAB RESULTS:  Lab Results  Component Value Date   NA 138 12/01/2015   K 3.7 12/01/2015   CL 107 12/01/2015   CO2 24 12/01/2015   GLUCOSE 98 12/01/2015   BUN 20 12/01/2015   CREATININE 0.79 12/01/2015   CALCIUM 9.1 12/01/2015   PROT 7.7 09/26/2015   ALBUMIN 4.3 09/26/2015   AST 25 09/26/2015   ALT 18 09/26/2015   ALKPHOS 70 09/26/2015    BILITOT 0.3 09/26/2015   GFRNONAA >60 12/01/2015   GFRAA >60 12/01/2015    Lab Results  Component Value Date   WBC 6.4 12/20/2015   NEUTROABS 3.9 12/20/2015   HGB 8.1 (L) 12/20/2015   HCT 26.2 (L) 12/20/2015   MCV 65.5 (L) 12/20/2015   PLT 273 12/20/2015   Lab Results  Component Value Date   IRON 10 (L) 12/20/2015   TIBC 548 (H) 12/20/2015   IRONPCTSAT 2 (L) 12/20/2015   Lab Results  Component Value Date   FERRITIN 5 (L) 12/20/2015     STUDIES: Dg Chest 2 View  Result Date: 12/02/2015 CLINICAL DATA:  Sternal pain 3 weeks after left mastectomy EXAM: CHEST  2 VIEW COMPARISON:  11/12/2015 FINDINGS: There is a right subclavian Port-A-Cath with tip at the right atrium. There is unchanged left hemidiaphragm elevation. The lungs are clear. The pulmonary vasculature is normal. There is no pleural effusion. Hilar and mediastinal contours are unremarkable and unchanged. No skeletal abnormalities are evident. IMPRESSION: No active cardiopulmonary disease. Unchanged left hemidiaphragm elevation. Electronically Signed   By: Andreas Newport M.D.   On: 12/02/2015 01:21    ASSESSMENT: Cancer midline right breast, iron deficiency anemia.  PLAN:    1. Cancer midline right breast: Although patient had stage I disease, she elected mastectomy since she did not want to undergo radiation therapy. Her right mastectomy was performed in August 2016. She currently is taking tamoxifen which will complete 5 years of treatment in September 2021. Patient recently underwent prophylactic left mastectomy.  2. Iron deficiency anemia: Patient has required multiple blood transfusions in the past. She has a difficult stick for both laboratory work and treatment, therefore had a port placed at the time of her mastectomy. Patient's hemoglobin and iron stores continue to be decreased, therefore will proceed with 510 mg IV Feraheme today. Return to clinic in 1 week for a second infusion and then in 2 months for repeat  laboratory work, further evaluation, and consideration of IV iron. Patient will also require colonoscopy in the near future. 3. Breast pain: Continue follow-up and evaluation for surgery.   Patient expressed understanding and was in agreement with this plan. She also understands that She can call clinic at any time with any questions, concerns, or complaints.   Malignant neoplasm of midline of right breast (HCC)   Staging form: Breast, AJCC 7th Edition   - Clinical: Stage IA (T1a, N0, M0) - Signed by Forest Gleason, MD on  01/13/2015  Lloyd Huger, MD   12/24/2015 11:36 AM

## 2015-12-24 ENCOUNTER — Inpatient Hospital Stay (HOSPITAL_BASED_OUTPATIENT_CLINIC_OR_DEPARTMENT_OTHER): Payer: Medicaid Other | Admitting: Oncology

## 2015-12-24 ENCOUNTER — Other Ambulatory Visit: Payer: Self-pay | Admitting: Oncology

## 2015-12-24 ENCOUNTER — Inpatient Hospital Stay: Payer: Medicaid Other

## 2015-12-24 VITALS — BP 160/100 | HR 70 | Temp 96.4°F | Resp 18 | Wt 204.3 lb

## 2015-12-24 DIAGNOSIS — C50211 Malignant neoplasm of upper-inner quadrant of right female breast: Secondary | ICD-10-CM | POA: Diagnosis not present

## 2015-12-24 DIAGNOSIS — Z170 Estrogen receptor positive status [ER+]: Secondary | ICD-10-CM | POA: Diagnosis not present

## 2015-12-24 DIAGNOSIS — D509 Iron deficiency anemia, unspecified: Secondary | ICD-10-CM | POA: Diagnosis not present

## 2015-12-24 DIAGNOSIS — Z9013 Acquired absence of bilateral breasts and nipples: Secondary | ICD-10-CM | POA: Diagnosis not present

## 2015-12-24 DIAGNOSIS — Z79899 Other long term (current) drug therapy: Secondary | ICD-10-CM

## 2015-12-24 DIAGNOSIS — C50811 Malignant neoplasm of overlapping sites of right female breast: Secondary | ICD-10-CM

## 2015-12-24 MED ORDER — SODIUM CHLORIDE 0.9 % IV SOLN
Freq: Once | INTRAVENOUS | Status: AC
  Administered 2015-12-24: 12:00:00 via INTRAVENOUS
  Filled 2015-12-24: qty 1000

## 2015-12-24 MED ORDER — SODIUM CHLORIDE 0.9 % IJ SOLN
3.0000 mL | Freq: Once | INTRAMUSCULAR | Status: DC | PRN
  Filled 2015-12-24: qty 10

## 2015-12-24 MED ORDER — SODIUM CHLORIDE 0.9 % IV SOLN
510.0000 mg | Freq: Once | INTRAVENOUS | Status: AC
  Administered 2015-12-24: 510 mg via INTRAVENOUS
  Filled 2015-12-24: qty 17

## 2015-12-24 MED ORDER — HEPARIN SOD (PORK) LOCK FLUSH 100 UNIT/ML IV SOLN
500.0000 [IU] | Freq: Once | INTRAVENOUS | Status: AC
  Administered 2015-12-24: 500 [IU] via INTRAVENOUS
  Filled 2015-12-24: qty 5

## 2015-12-24 NOTE — Progress Notes (Signed)
States surgery went well. Is having pain and swelling around mastectomy site. Has appt with Jamal Collin on Thursday for followup. Pt states is having pain in right side from old mastectomy site and hurts more when taking deep breaths.

## 2015-12-26 ENCOUNTER — Ambulatory Visit (INDEPENDENT_AMBULATORY_CARE_PROVIDER_SITE_OTHER): Payer: Medicaid Other | Admitting: General Surgery

## 2015-12-26 ENCOUNTER — Encounter: Payer: Self-pay | Admitting: General Surgery

## 2015-12-26 VITALS — BP 132/78 | HR 76 | Resp 14 | Ht 67.0 in | Wt 203.0 lb

## 2015-12-26 DIAGNOSIS — T888XXS Other specified complications of surgical and medical care, not elsewhere classified, sequela: Secondary | ICD-10-CM

## 2015-12-26 DIAGNOSIS — IMO0001 Reserved for inherently not codable concepts without codable children: Secondary | ICD-10-CM

## 2015-12-26 DIAGNOSIS — T792XXS Traumatic secondary and recurrent hemorrhage and seroma, sequela: Secondary | ICD-10-CM

## 2015-12-26 NOTE — Progress Notes (Signed)
Patient ID: Elizabeth Flynn, female   DOB: 1970/04/29, 45 y.o.   MRN: [id number]  Chief Complaint  Patient presents with  . Routine Post Op    mastectomy    HPI Elizabeth Flynn is a 46 y.o. female here today for her follow up mastectomy done on 11/12/15. Patient states she got a friend who was a NP to pull out her drain out on 12/22/15. Since then she has noticed more fluid accumulating in the area.  I have reviewed the history of present illness with the patient.   HPI  Past Medical History:  Diagnosis Date  . Anemia   . Anxiety    takes nothing  . Blood transfusion without reported diagnosis 2016   patient has had 25 transfusions throughout her life. her blood levels run chronically low  . BRCA negative 10-16-14  . Breast cancer ([place]) 2016   RT MASTECTOMY  . Cancer of right breast ([place]) 10/16/2014   Upper and inner quadrant right breast, Invasive ductal carcinoma, Atypical Ductal Hyperplasia, ER/PR Pos, Her 2 Neg  . Cardiomyopathy ([place])   . Depression   . Diarrhea   . Dysrhythmia    TACHYCARDIA...happens randomly. not seen by any cardiologist  . Enlarged heart   . GERD (gastroesophageal reflux disease)    h/o- no meds since hernia surgery  . Headache   . Hypertension   . Kidney stones   . MRSA infection 2009  . Ovarian cyst   . Pancreatitis   . PONV (postoperative nausea and vomiting)    DRY HEAVING AFTER BREAST LUMPECTOMY 10-2014  . Shortness of breath dyspnea    with exertion  . Stroke [facility])    Sept. 2014 rt sided bleed  . Stroke [facility])     Past Surgical History:  Procedure Laterality Date  . ABDOMINAL HYSTERECTOMY    . BREAST BIOPSY Right 10/10/2014   Right breast invasive carcinoma. T1A, N0.  Marland Kitchen BREAST LUMPECTOMY WITH SENTINEL LYMPH NODE BIOPSY Right 10/23/2014   Procedure: Right breast lumpectomy with sentinel node biopsy ;  Surgeon: Christene Lye, MD;  Location: [facility] ORS;  Service: General;  Laterality: Right;  . CHOLECYSTECTOMY    . DILATATION &  CURRETTAGE/HYSTEROSCOPY WITH RESECTOCOPE N/A 03/18/2015   Procedure: DILATATION & CURETTAGE/HYSTEROSCOPY WITH RESECTOCOPE;  Surgeon: Brayton Mars, MD;  Location: [facility] ORS;  Service: Gynecology;  Laterality: N/A;  . HERNIA REPAIR    . LITHOTRIPSY    . MASTECTOMY Right 2016   BREAST CA  . MASTECTOMY MODIFIED RADICAL Right 12/05/2014   Procedure: MASTECTOMY MODIFIED RADICAL;  Surgeon: Christene Lye, MD;  Location: [facility] ORS;  Service: General;  Laterality: Right;  . OOPHORECTOMY Bilateral   . PORTACATH PLACEMENT Right 11/12/2015   Procedure: INSERTION PORT-A-CATH;  Surgeon: Christene Lye, MD;  Location: [facility] ORS;  Service: General;  Laterality: Right;  . TONSILLECTOMY    . TOTAL MASTECTOMY Left 11/12/2015   Procedure: TOTAL MASTECTOMY;  Surgeon: Christene Lye, MD;  Location: [facility] ORS;  Service: General;  Laterality: Left;  . TUBAL LIGATION    . VAGINAL HYSTERECTOMY Bilateral 04/29/2015   Procedure: HYSTERECTOMY VAGINAL/ BILATERAL SALPINGOOPHORECTOMY;  Surgeon: Brayton Mars, MD;  Location: [facility] ORS;  Service: Gynecology;  Laterality: Bilateral;    Family History  Problem Relation Age of Onset  . Breast cancer Cousin 59  . Heart disease Maternal Grandmother   . Diabetes Paternal Grandmother   . Heart disease Paternal Grandmother   . Ovarian cancer Neg Hx   . Colon  cancer Neg Hx     Social History Social History  Substance Use Topics  . Smoking status: Former Smoker    Types: Cigarettes    Quit date: 04/26/1995  . Smokeless tobacco: Never Used     Comment: no passive smoke in home  . Alcohol use No     Comment: SOBER FOR 3 MGQQP(6195)    Allergies  Allergen Reactions  . Hydrocodone-Acetaminophen Hives and Itching  . Reglan [Metoclopramide] Other (See Comments)    "freaks me out, makes me nervous"  . Clindamycin/Lincomycin Rash  . Morphine And Related Rash    Current Outpatient Prescriptions  Medication Sig Dispense Refill  . acetaminophen  (TYLENOL) 500 MG tablet Take 2 tablets (1,000 mg total) by mouth every 6 (six) hours as needed. For pain 30 tablet 0  . amLODipine (NORVASC) 10 MG tablet Take 1 tablet (10 mg total) by mouth daily. (Patient taking differently: Take 10 mg by mouth every morning. ) 30 tablet 0  . aspirin 325 MG tablet Take 325 mg by mouth as needed.    . ferrous fumarate-iron polysaccharide complex (TANDEM) 162-115.2 MG CAPS capsule Take 1 capsule by mouth daily with breakfast. 30 capsule 6  . FLUoxetine (PROZAC) 20 MG capsule TAKE ONE TABLET BY MOUTH DAILY FOR DEPRESSION-am  5  . lamoTRIgine (LAMICTAL) 25 MG tablet Take 25 mg by mouth every morning.     Marland Kitchen lisinopril (PRINIVIL,ZESTRIL) 20 MG tablet Take 1 tablet (20 mg total) by mouth every evening. (Patient taking differently: Take 20 mg by mouth every morning. ) 30 tablet 0  . meloxicam (MOBIC) 7.5 MG tablet TAKE 1 TABLET (7.5 MG TOTAL) BY MOUTH DAILY. 30 tablet 0  . promethazine (PHENERGAN) 12.5 MG suppository Place 1 suppository (12.5 mg total) rectally every 6 (six) hours as needed for nausea or vomiting. 12 each 0  . tamoxifen (NOLVADEX) 20 MG tablet TAKE 1 TABLET (20 MG TOTAL) BY MOUTH DAILY. 30 tablet 6  . venlafaxine (EFFEXOR) 37.5 MG tablet Take 37.5 mg by mouth every morning.     . vitamin C (ASCORBIC ACID) 500 MG tablet Take 500 mg by mouth daily.    Marland Kitchen zolpidem (AMBIEN) 5 MG tablet Take 1 tablet (5 mg total) by mouth at bedtime as needed for sleep. 15 tablet 0   No current facility-administered medications for this visit.     Review of Systems Review of Systems  Constitutional: Negative.   Respiratory: Negative.   Cardiovascular: Negative.     Blood pressure 132/78, pulse 76, resp. rate 14, height 5' 7"  (1.702 m), weight 203 lb (92.1 kg), last menstrual period 03/26/2015.  Physical Exam Physical Exam  Constitutional: She is oriented to person, place, and time. She appears well-developed and well-nourished.  Pulmonary/Chest:    Neurological:  She is alert and oriented to person, place, and time.  Skin: Skin is warm and dry.    Data Reviewed Prior notes.  Assessment    Persistent seroma, left mastectomy site    Plan    Return tomorrow for JP drain placement. Do not feel seroma catheter will be sufficient given the large size of the seroma.     This information has been scribed by Karie Fetch RN, BSN,BC.   Lielle Vandervort G 12/26/2015, 4:50 PM

## 2015-12-26 NOTE — Patient Instructions (Signed)
Return tommorrow

## 2015-12-27 ENCOUNTER — Encounter: Payer: Self-pay | Admitting: General Surgery

## 2015-12-27 ENCOUNTER — Ambulatory Visit (INDEPENDENT_AMBULATORY_CARE_PROVIDER_SITE_OTHER): Payer: Medicaid Other | Admitting: General Surgery

## 2015-12-27 VITALS — HR 72 | Resp 14 | Ht 67.0 in | Wt 203.0 lb

## 2015-12-27 DIAGNOSIS — C50211 Malignant neoplasm of upper-inner quadrant of right female breast: Secondary | ICD-10-CM

## 2015-12-27 DIAGNOSIS — T888XXS Other specified complications of surgical and medical care, not elsewhere classified, sequela: Principal | ICD-10-CM

## 2015-12-27 DIAGNOSIS — IMO0001 Reserved for inherently not codable concepts without codable children: Secondary | ICD-10-CM

## 2015-12-27 DIAGNOSIS — T792XXS Traumatic secondary and recurrent hemorrhage and seroma, sequela: Principal | ICD-10-CM

## 2015-12-27 NOTE — Progress Notes (Signed)
Patient ID: Elizabeth Flynn, female   DOB: 1970-07-05, 45 y.o.   MRN: [id number]  Chief Complaint  Patient presents with  . Other    HPI Elizabeth Flynn is a 45 y.o. female here today for a drain placement.  HPI  Past Medical History:  Diagnosis Date  . Anemia   . Anxiety    takes nothing  . Blood transfusion without reported diagnosis 2016   patient has had 25 transfusions throughout her life. her blood levels run chronically low  . BRCA negative 10-16-14  . Breast cancer ([place]) 2016   RT MASTECTOMY  . Cancer of right breast ([place]) 10/16/2014   Upper and inner quadrant right breast, Invasive ductal carcinoma, Atypical Ductal Hyperplasia, ER/PR Pos, Her 2 Neg  . Cardiomyopathy ([place])   . Depression   . Diarrhea   . Dysrhythmia    TACHYCARDIA...happens randomly. not seen by any cardiologist  . Enlarged heart   . GERD (gastroesophageal reflux disease)    h/o- no meds since hernia surgery  . Headache   . Hypertension   . Kidney stones   . MRSA infection 2009  . Ovarian cyst   . Pancreatitis   . PONV (postoperative nausea and vomiting)    DRY HEAVING AFTER BREAST LUMPECTOMY 10-2014  . Shortness of breath dyspnea    with exertion  . Stroke [facility])    Sept. 2014 rt sided bleed  . Stroke [facility])     Past Surgical History:  Procedure Laterality Date  . ABDOMINAL HYSTERECTOMY    . BREAST BIOPSY Right 10/10/2014   Right breast invasive carcinoma. T1A, N0.  Marland Kitchen BREAST LUMPECTOMY WITH SENTINEL LYMPH NODE BIOPSY Right 10/23/2014   Procedure: Right breast lumpectomy with sentinel node biopsy ;  Surgeon: Christene Lye, MD;  Location: [facility] ORS;  Service: General;  Laterality: Right;  . CHOLECYSTECTOMY    . DILATATION & CURRETTAGE/HYSTEROSCOPY WITH RESECTOCOPE N/A 03/18/2015   Procedure: DILATATION & CURETTAGE/HYSTEROSCOPY WITH RESECTOCOPE;  Surgeon: Brayton Mars, MD;  Location: [facility] ORS;  Service: Gynecology;  Laterality: N/A;  . HERNIA REPAIR    . LITHOTRIPSY    .  MASTECTOMY Right 2016   BREAST CA  . MASTECTOMY MODIFIED RADICAL Right 12/05/2014   Procedure: MASTECTOMY MODIFIED RADICAL;  Surgeon: Christene Lye, MD;  Location: [facility] ORS;  Service: General;  Laterality: Right;  . OOPHORECTOMY Bilateral   . PORTACATH PLACEMENT Right 11/12/2015   Procedure: INSERTION PORT-A-CATH;  Surgeon: Christene Lye, MD;  Location: [facility] OR[facility];  Service: General;  Laterality: Right;  . TONSILLECTOMY    . TOTAL MASTECTOMY Left 11/12/2015   Procedure: TOTAL MASTECTOMY;  Surgeon: Christene Lye, MD;  Location: [facility];  Service: General;  Laterality: Left;  . TUBAL LIGATION    . VAGINAL HYSTERECTOMY Bilateral 04/29/2015   Procedure: HYSTERECTOMY VAGINAL/ BILATERAL SALPINGOOPHORECTOMY;  Surgeon: Brayton Mars, MD;  Location: [facility] ORS;  Service: Gynecology;  Laterality: Bilateral;    Family History  Problem Relation Age of Onset  . Breast cancer Cousin 32  . Heart disease Maternal Grandmother   . Diabetes Paternal Grandmother   . Heart disease Paternal Grandmother   . Ovarian cancer Neg Hx   . Colon cancer Neg Hx     Social History Social History  Substance Use Topics  . Smoking status: Former Smoker    Types: Cigarettes    Quit date: 04/26/1995  . Smokeless tobacco: Never Used     Comment: no passive smoke in home  . Alcohol use  No     Comment: SOBER FOR 3 HYIFO(2774)    Allergies  Allergen Reactions  . Hydrocodone-Acetaminophen Hives and Itching  . Reglan [Metoclopramide] Other (See Comments)    "freaks me out, makes me nervous"  . Clindamycin/Lincomycin Rash  . Morphine And Related Rash    Current Outpatient Prescriptions  Medication Sig Dispense Refill  . acetaminophen (TYLENOL) 500 MG tablet Take 2 tablets (1,000 mg total) by mouth every 6 (six) hours as needed. For pain 30 tablet 0  . amLODipine (NORVASC) 10 MG tablet Take 1 tablet (10 mg total) by mouth daily. (Patient taking differently: Take 10 mg by mouth every morning.  ) 30 tablet 0  . aspirin 325 MG tablet Take 325 mg by mouth as needed.    . ferrous fumarate-iron polysaccharide complex (TANDEM) 162-115.2 MG CAPS capsule Take 1 capsule by mouth daily with breakfast. 30 capsule 6  . FLUoxetine (PROZAC) 20 MG capsule TAKE ONE TABLET BY MOUTH DAILY FOR DEPRESSION-am  5  . lamoTRIgine (LAMICTAL) 25 MG tablet Take 25 mg by mouth every morning.     Marland Kitchen lisinopril (PRINIVIL,ZESTRIL) 20 MG tablet Take 1 tablet (20 mg total) by mouth every evening. (Patient taking differently: Take 20 mg by mouth every morning. ) 30 tablet 0  . meloxicam (MOBIC) 7.5 MG tablet TAKE 1 TABLET (7.5 MG TOTAL) BY MOUTH DAILY. 30 tablet 0  . promethazine (PHENERGAN) 12.5 MG suppository Place 1 suppository (12.5 mg total) rectally every 6 (six) hours as needed for nausea or vomiting. 12 each 0  . tamoxifen (NOLVADEX) 20 MG tablet TAKE 1 TABLET (20 MG TOTAL) BY MOUTH DAILY. 30 tablet 6  . venlafaxine (EFFEXOR) 37.5 MG tablet Take 37.5 mg by mouth every morning.     . vitamin C (ASCORBIC ACID) 500 MG tablet Take 500 mg by mouth daily.    Marland Kitchen zolpidem (AMBIEN) 5 MG tablet Take 1 tablet (5 mg total) by mouth at bedtime as needed for sleep. 15 tablet 0   No current facility-administered medications for this visit.     Review of Systems Review of Systems  Constitutional: Negative.   Respiratory: Negative.   Cardiovascular: Negative.     Pulse 72, resp. rate 14, height 5' 7"  (1.702 m), weight 203 lb (92.1 kg), last menstrual period 03/26/2015.  Physical Exam Physical Exam  Data Reviewed  None Assessment    Recurrent seroma left mastectomy site.    Plan    69m 1% xylocaine used. Area below the mastectomy incision was prepped with chloro prep, local anesthetic instilled. 1cm incision made and seroma cavity entered. A JP drain was positioned and anchored with 3-0 Nylon stitch.   Dressed with 4/4s and tegaderm. Pt advised to keep record of drainage Follow up in 10 days  This  information has been scribed by JGaspar ColaCMA.   Elizabeth Flynn 01/01/2016, 8:21 AM

## 2015-12-31 ENCOUNTER — Inpatient Hospital Stay: Payer: Medicaid Other

## 2016-01-01 ENCOUNTER — Encounter: Payer: Self-pay | Admitting: General Surgery

## 2016-01-07 ENCOUNTER — Ambulatory Visit (INDEPENDENT_AMBULATORY_CARE_PROVIDER_SITE_OTHER): Payer: Medicaid Other | Admitting: *Deleted

## 2016-01-07 ENCOUNTER — Telehealth: Payer: Self-pay

## 2016-01-07 DIAGNOSIS — C50211 Malignant neoplasm of upper-inner quadrant of right female breast: Secondary | ICD-10-CM

## 2016-01-07 DIAGNOSIS — T8189XD Other complications of procedures, not elsewhere classified, subsequent encounter: Secondary | ICD-10-CM

## 2016-01-07 NOTE — Telephone Encounter (Signed)
Patient called concerned about her drain. She had been doing some lifting at work and had noticed that she was draining more fluid. She states that it has at least doubled in the last two days and is pink tinged. She denies any pain, chills, or fever. She does state that she has some irritation where her drain goes in. She denies any fluid seepage around the area. I let her know that we would look at it tomorrow morning when she came in and could have her seen by the physician if it appeared this was needed. She was aware to call back for any further concerns.

## 2016-01-07 NOTE — Progress Notes (Signed)
She came in for drain check. The drainage has increased over the past several days to 300-400 a day, serosanguinous fluid. She states she has not really changed her activity much. Continue care and recording amounts. Appt for Dr Jamal Collin Thursday.

## 2016-01-07 NOTE — Patient Instructions (Signed)
The patient is aware to call back for any questions or concerns.  

## 2016-01-09 ENCOUNTER — Ambulatory Visit: Payer: Medicaid Other | Admitting: General Surgery

## 2016-01-09 ENCOUNTER — Inpatient Hospital Stay: Payer: Medicaid Other | Attending: Oncology

## 2016-01-09 VITALS — BP 113/66 | HR 67 | Temp 97.0°F | Resp 16

## 2016-01-09 DIAGNOSIS — Z9013 Acquired absence of bilateral breasts and nipples: Secondary | ICD-10-CM | POA: Insufficient documentation

## 2016-01-09 DIAGNOSIS — F419 Anxiety disorder, unspecified: Secondary | ICD-10-CM | POA: Insufficient documentation

## 2016-01-09 DIAGNOSIS — I10 Essential (primary) hypertension: Secondary | ICD-10-CM | POA: Diagnosis not present

## 2016-01-09 DIAGNOSIS — C50211 Malignant neoplasm of upper-inner quadrant of right female breast: Secondary | ICD-10-CM | POA: Insufficient documentation

## 2016-01-09 DIAGNOSIS — D509 Iron deficiency anemia, unspecified: Secondary | ICD-10-CM | POA: Diagnosis not present

## 2016-01-09 DIAGNOSIS — Z79899 Other long term (current) drug therapy: Secondary | ICD-10-CM | POA: Insufficient documentation

## 2016-01-09 DIAGNOSIS — K219 Gastro-esophageal reflux disease without esophagitis: Secondary | ICD-10-CM | POA: Insufficient documentation

## 2016-01-09 DIAGNOSIS — Z8614 Personal history of Methicillin resistant Staphylococcus aureus infection: Secondary | ICD-10-CM | POA: Diagnosis not present

## 2016-01-09 DIAGNOSIS — Z170 Estrogen receptor positive status [ER+]: Secondary | ICD-10-CM | POA: Diagnosis not present

## 2016-01-09 DIAGNOSIS — D508 Other iron deficiency anemias: Secondary | ICD-10-CM

## 2016-01-09 MED ORDER — SODIUM CHLORIDE 0.9% FLUSH
10.0000 mL | INTRAVENOUS | Status: DC | PRN
  Filled 2016-01-09: qty 10

## 2016-01-09 MED ORDER — SODIUM CHLORIDE 0.9 % IV SOLN
Freq: Once | INTRAVENOUS | Status: AC
  Administered 2016-01-09: 15:00:00 via INTRAVENOUS
  Filled 2016-01-09: qty 1000

## 2016-01-09 MED ORDER — SODIUM CHLORIDE 0.9 % IV SOLN
510.0000 mg | Freq: Once | INTRAVENOUS | Status: AC
  Administered 2016-01-09: 510 mg via INTRAVENOUS
  Filled 2016-01-09: qty 17

## 2016-01-09 MED ORDER — SODIUM CHLORIDE 0.9% FLUSH
10.0000 mL | INTRAVENOUS | Status: AC | PRN
  Administered 2016-01-09: 10 mL
  Filled 2016-01-09: qty 10

## 2016-01-09 MED ORDER — HEPARIN SOD (PORK) LOCK FLUSH 100 UNIT/ML IV SOLN
500.0000 [IU] | INTRAVENOUS | Status: AC | PRN
  Administered 2016-01-09: 500 [IU]

## 2016-01-15 ENCOUNTER — Ambulatory Visit: Payer: Self-pay | Admitting: General Surgery

## 2016-01-20 ENCOUNTER — Other Ambulatory Visit: Payer: Self-pay | Admitting: *Deleted

## 2016-01-20 NOTE — Telephone Encounter (Signed)
Deferred to PCP, as per ON from L Herring, AGNP-C

## 2016-01-21 ENCOUNTER — Encounter: Payer: Self-pay | Admitting: General Surgery

## 2016-01-21 ENCOUNTER — Ambulatory Visit (INDEPENDENT_AMBULATORY_CARE_PROVIDER_SITE_OTHER): Payer: Medicaid Other | Admitting: General Surgery

## 2016-01-21 VITALS — BP 140/70 | HR 80 | Resp 12 | Ht 67.0 in | Wt 200.0 lb

## 2016-01-21 DIAGNOSIS — N6489 Other specified disorders of breast: Secondary | ICD-10-CM

## 2016-01-21 NOTE — Progress Notes (Signed)
Patient ID: Elizabeth Flynn, female   DOB: 1971-04-02, 45 y.o.   MRN: [id number]  Chief Complaint  Patient presents with  . Follow-up    HPI Elizabeth Flynn is a 45 y.o. female.  Here today for follow up seroma. Drain sheet present. Average 250 ml a day. She did go to the [facility] ED due to shortness of breath. I have reviewed the history of present illness with the patient.  HPI  Past Medical History:  Diagnosis Date  . Anemia   . Anxiety    takes nothing  . Blood transfusion without reported diagnosis 2016   patient has had 25 transfusions throughout her life. her blood levels run chronically low  . BRCA negative 10-16-14  . Breast cancer ([place]) 2016   RT MASTECTOMY  . Cancer of right breast ([place]) 10/16/2014   Upper and inner quadrant right breast, Invasive ductal carcinoma, Atypical Ductal Hyperplasia, ER/PR Pos, Her 2 Neg  . Cardiomyopathy ([place])   . Depression   . Diarrhea   . Dysrhythmia    TACHYCARDIA...happens randomly. not seen by any cardiologist  . Enlarged heart   . GERD (gastroesophageal reflux disease)    h/o- no meds since hernia surgery  . Headache   . Hypertension   . Kidney stones   . MRSA infection 2009  . Ovarian cyst   . Pancreatitis   . PONV (postoperative nausea and vomiting)    DRY HEAVING AFTER BREAST LUMPECTOMY 10-2014  . Shortness of breath dyspnea    with exertion  . Stroke [facility])    Sept. 2014 rt sided bleed  . Stroke [facility])     Past Surgical History:  Procedure Laterality Date  . ABDOMINAL HYSTERECTOMY    . BREAST BIOPSY Right 10/10/2014   Right breast invasive carcinoma. T1A, N0.  Marland Kitchen BREAST LUMPECTOMY WITH SENTINEL LYMPH NODE BIOPSY Right 10/23/2014   Procedure: Right breast lumpectomy with sentinel node biopsy ;  Surgeon: Christene Lye, MD;  Location: [facility] ORS;  Service: General;  Laterality: Right;  . CHOLECYSTECTOMY    . DILATATION & CURRETTAGE/HYSTEROSCOPY WITH RESECTOCOPE N/A 03/18/2015   Procedure: DILATATION &  CURETTAGE/HYSTEROSCOPY WITH RESECTOCOPE;  Surgeon: Brayton Mars, MD;  Location: [facility] ORS;  Service: Gynecology;  Laterality: N/A;  . HERNIA REPAIR    . LITHOTRIPSY    . MASTECTOMY Right 2016   BREAST CA  . MASTECTOMY MODIFIED RADICAL Right 12/05/2014   Procedure: MASTECTOMY MODIFIED RADICAL;  Surgeon: Christene Lye, MD;  Location: [facility] ORS;  Service: General;  Laterality: Right;  . OOPHORECTOMY Bilateral   . PORTACATH PLACEMENT Right 11/12/2015   Procedure: INSERTION PORT-A-CATH;  Surgeon: Christene Lye, MD;  Location: [facility] ORS;  Service: General;  Laterality: Right;  . TONSILLECTOMY    . TOTAL MASTECTOMY Left 11/12/2015   Procedure: TOTAL MASTECTOMY;  Surgeon: Christene Lye, MD;  Location: [facility] ORS;  Service: General;  Laterality: Left;  . TUBAL LIGATION    . VAGINAL HYSTERECTOMY Bilateral 04/29/2015   Procedure: HYSTERECTOMY VAGINAL/ BILATERAL SALPINGOOPHORECTOMY;  Surgeon: Brayton Mars, MD;  Location: [facility] ORS;  Service: Gynecology;  Laterality: Bilateral;    Family History  Problem Relation Age of Onset  . Breast cancer Cousin 17  . Heart disease Maternal Grandmother   . Diabetes Paternal Grandmother   . Heart disease Paternal Grandmother   . Ovarian cancer Neg Hx   . Colon cancer Neg Hx     Social History Social History  Substance Use Topics  . Smoking status: Former  Smoker    Types: Cigarettes    Quit date: 04/26/1995  . Smokeless tobacco: Never Used     Comment: no passive smoke in home  . Alcohol use No     Comment: SOBER FOR 3 [id number])    Allergies  Allergen Reactions  . Hydrocodone-Acetaminophen Hives and Itching  . Reglan [Metoclopramide] Other (See Comments)    "freaks me out, makes me nervous"  . Clindamycin/Lincomycin Rash  . Morphine And Related Rash    Current Outpatient Prescriptions  Medication Sig Dispense Refill  . acetaminophen (TYLENOL) 500 MG tablet Take 2 tablets (1,000 mg total) by mouth every 6 (six)  hours as needed. For pain 30 tablet 0  . amLODipine (NORVASC) 10 MG tablet Take 1 tablet (10 mg total) by mouth daily. (Patient taking differently: Take 10 mg by mouth every morning. ) 30 tablet 0  . aspirin 325 MG tablet Take 325 mg by mouth as needed.    . ferrous fumarate-iron polysaccharide complex (TANDEM) 162-115.2 MG CAPS capsule Take 1 capsule by mouth daily with breakfast. 30 capsule 6  . FLUoxetine (PROZAC) 20 MG capsule TAKE ONE TABLET BY MOUTH DAILY FOR DEPRESSION-am  5  . lamoTRIgine (LAMICTAL) 25 MG tablet Take 25 mg by mouth every morning.     Marland Kitchen lisinopril (PRINIVIL,ZESTRIL) 20 MG tablet Take 1 tablet (20 mg total) by mouth every evening. (Patient taking differently: Take 20 mg by mouth every morning. ) 30 tablet 0  . meloxicam (MOBIC) 7.5 MG tablet TAKE 1 TABLET (7.5 MG TOTAL) BY MOUTH DAILY. 30 tablet 0  . promethazine (PHENERGAN) 12.5 MG suppository Place 1 suppository (12.5 mg total) rectally every 6 (six) hours as needed for nausea or vomiting. 12 each 0  . tamoxifen (NOLVADEX) 20 MG tablet TAKE 1 TABLET (20 MG TOTAL) BY MOUTH DAILY. 30 tablet 6  . venlafaxine (EFFEXOR) 37.5 MG tablet Take 37.5 mg by mouth every morning.     . vitamin C (ASCORBIC ACID) 500 MG tablet Take 500 mg by mouth daily.    Marland Kitchen zolpidem (AMBIEN) 5 MG tablet Take 1 tablet (5 mg total) by mouth at bedtime as needed for sleep. 15 tablet 0   No current facility-administered medications for this visit.    Facility-Administered Medications Ordered in Other Visits  Medication Dose Route Frequency Provider Last Rate Last Dose  . sodium chloride flush (NS) 0.9 % injection 10 mL  10 mL Intracatheter PRN Lloyd Huger, MD        Review of Systems Review of Systems  Constitutional: Negative.   Respiratory: Negative.   Cardiovascular: Negative.     Blood pressure 140/70, pulse 80, resp. rate 12, height 5' 7"  (1.702 m), weight 200 lb (90.7 kg), last menstrual period 03/26/2015.  Physical Exam Physical  Exam  Constitutional: She is oriented to person, place, and time. She appears well-developed and well-nourished.  Neurological: She is alert and oriented to person, place, and time.  Skin: Skin is warm and dry.  Psychiatric: Her behavior is normal.   Left mastectomy siote remains well healed. No loculations of seroma noted. Drain intact, reports 250 ml per day-was >400 last week. Data Reviewed Progress notes  Assessment    Recurrent seroma left mastectomy site, drainage appears to be decreasing    Plan    Follow up in 2 weeks She is aware to call if drainage is less than 30 ml a day for 3 days.     This information has been scribed by Rosann Auerbach  Hatch RN, BSN,BC.  Harini Dearmond G 01/21/2016, 11:32 AM

## 2016-01-21 NOTE — Patient Instructions (Signed)
The patient is aware to call back for any questions or concerns.  

## 2016-01-29 NOTE — Addendum Note (Signed)
Addendum  created 01/29/16 1240 by Courtney Paris, CRNA   Delete clinical note

## 2016-02-23 NOTE — Progress Notes (Signed)
[place]  Telephone:([phone]) [phone] [phone]) [phone]  ID: Elizabeth Flynn OB: 1970-05-05  MR#: [id number]  QIO#:[id number]  Patient Care Team: Janalyn Shy, MD as PCP - General (Family Medicine) Rico Junker, RN as Registered Nurse (Radiology) Theodore Demark, RN as Registered Nurse Seeplaputhur Robinette Haines, MD (General Surgery)  CHIEF COMPLAINT: Cancer midline right breast, iron deficiency anemia.  INTERVAL HISTORY: Patient returns to clinic today for further evaluation and consideration of IV iron. She continues to have chest wall pain and swelling at the site of her left mastectomy and is being evaluated at [facility] by plastic surgery. She continues to tolerate tamoxifen well without significant side effects. She does did not complain of weakness and fatigue today. She has no neurologic complaints. She denies any chest pain or shortness of breath. She denies any nausea, vomiting, constipation, or diarrhea. She has a history of rectal pain and bleeding. She has no urinary complaints. Patient offers no further specific complaints today.  REVIEW OF SYSTEMS:   Review of Systems  Constitutional: Negative for fever, malaise/fatigue and weight loss.  Respiratory: Negative for cough and shortness of breath.   Cardiovascular: Negative.  Negative for chest pain and leg swelling.  Gastrointestinal: Negative for abdominal pain, blood in stool and melena.  Genitourinary: Negative.   Musculoskeletal: Negative.   Neurological: Negative.  Negative for weakness.  Psychiatric/Behavioral: Negative.  The patient is not nervous/anxious.     As per HPI. Otherwise, a complete review of systems is negative.  PAST MEDICAL HISTORY: Past Medical History:  Diagnosis Date  . Anemia   . Anxiety    takes nothing  . Blood transfusion without reported diagnosis 2016   patient has had 25 transfusions throughout her life. her blood levels run chronically low  . BRCA negative 10-16-14  . Breast  cancer ([place]) 2016   RT MASTECTOMY  . Cancer of right breast ([place]) 10/16/2014   Upper and inner quadrant right breast, Invasive ductal carcinoma, Atypical Ductal Hyperplasia, ER/PR Pos, Her 2 Neg  . Cardiomyopathy ([place])   . Depression   . Diarrhea   . Dysrhythmia    TACHYCARDIA...happens randomly. not seen by any cardiologist  . Enlarged heart   . GERD (gastroesophageal reflux disease)    h/o- no meds since hernia surgery  . Headache   . Hypertension   . Kidney stones   . MRSA infection 2009  . Ovarian cyst   . Pancreatitis   . PONV (postoperative nausea and vomiting)    DRY HEAVING AFTER BREAST LUMPECTOMY 10-2014  . Shortness of breath dyspnea    with exertion  . Stroke [facility])    Sept. 2014 rt sided bleed  . Stroke [facility])     PAST SURGICAL HISTORY: Past Surgical History:  Procedure Laterality Date  . ABDOMINAL HYSTERECTOMY    . BREAST BIOPSY Right 10/10/2014   Right breast invasive carcinoma. T1A, N0.  Marland Kitchen BREAST LUMPECTOMY WITH SENTINEL LYMPH NODE BIOPSY Right 10/23/2014   Procedure: Right breast lumpectomy with sentinel node biopsy ;  Surgeon: Christene Lye, MD;  Location: [facility] ORS;  Service: General;  Laterality: Right;  . CHOLECYSTECTOMY    . DILATATION & CURRETTAGE/HYSTEROSCOPY WITH RESECTOCOPE N/A 03/18/2015   Procedure: DILATATION & CURETTAGE/HYSTEROSCOPY WITH RESECTOCOPE;  Surgeon: Brayton Mars, MD;  Location: [facility] ORS;  Service: Gynecology;  Laterality: N/A;  . HERNIA REPAIR    . LITHOTRIPSY    . MASTECTOMY Right 2016   BREAST CA  . MASTECTOMY MODIFIED RADICAL Right 12/05/2014  Procedure: MASTECTOMY MODIFIED RADICAL;  Surgeon: Christene Lye, MD;  Location: [facility] ORS;  Service: General;  Laterality: Right;  . OOPHORECTOMY Bilateral   . PORTACATH PLACEMENT Right 11/12/2015   Procedure: INSERTION PORT-A-CATH;  Surgeon: Christene Lye, MD;  Location: [facility] ORS;  Service: General;  Laterality: Right;  . TONSILLECTOMY    . TOTAL MASTECTOMY Left  11/12/2015   Procedure: TOTAL MASTECTOMY;  Surgeon: Christene Lye, MD;  Location: [facility] ORS;  Service: General;  Laterality: Left;  . TUBAL LIGATION    . VAGINAL HYSTERECTOMY Bilateral 04/29/2015   Procedure: HYSTERECTOMY VAGINAL/ BILATERAL SALPINGOOPHORECTOMY;  Surgeon: Brayton Mars, MD;  Location: [facility] ORS;  Service: Gynecology;  Laterality: Bilateral;    FAMILY HISTORY: Family History  Problem Relation Age of Onset  . Breast cancer Cousin 66  . Heart disease Maternal Grandmother   . Diabetes Paternal Grandmother   . Heart disease Paternal Grandmother   . Ovarian cancer Neg Hx   . Colon cancer Neg Hx        ADVANCED DIRECTIVES:    HEALTH MAINTENANCE: Social History  Substance Use Topics  . Smoking status: Former Smoker    Types: Cigarettes    Quit date: 04/26/1995  . Smokeless tobacco: Never Used     Comment: no passive smoke in home  . Alcohol use No     Comment: SOBER FOR 3 [id number])     Colonoscopy:  PAP:  Bone density:  Lipid panel:  Allergies  Allergen Reactions  . Hydrocodone-Acetaminophen Hives and Itching  . Reglan [Metoclopramide] Other (See Comments)    "freaks me out, makes me nervous"  . Clindamycin/Lincomycin Rash  . Morphine And Related Rash    Current Outpatient Prescriptions  Medication Sig Dispense Refill  . acetaminophen (TYLENOL) 500 MG tablet Take 2 tablets (1,000 mg total) by mouth every 6 (six) hours as needed. For pain 30 tablet 0  . amLODipine (NORVASC) 10 MG tablet Take 1 tablet (10 mg total) by mouth daily. (Patient taking differently: Take 10 mg by mouth every morning. ) 30 tablet 0  . aspirin 325 MG tablet Take 325 mg by mouth as needed.    . ferrous fumarate-iron polysaccharide complex (TANDEM) 162-115.2 MG CAPS capsule Take 1 capsule by mouth daily with breakfast. 30 capsule 6  . FLUoxetine (PROZAC) 20 MG capsule TAKE ONE TABLET BY MOUTH DAILY FOR DEPRESSION-am  5  . lamoTRIgine (LAMICTAL) 25 MG tablet Take 25 mg by  mouth every morning.     Marland Kitchen lisinopril (PRINIVIL,ZESTRIL) 20 MG tablet Take 1 tablet (20 mg total) by mouth every evening. (Patient taking differently: Take 20 mg by mouth every morning. ) 30 tablet 0  . meloxicam (MOBIC) 7.5 MG tablet TAKE 1 TABLET (7.5 MG TOTAL) BY MOUTH DAILY. 30 tablet 0  . promethazine (PHENERGAN) 12.5 MG suppository Place 1 suppository (12.5 mg total) rectally every 6 (six) hours as needed for nausea or vomiting. 12 each 0  . tamoxifen (NOLVADEX) 20 MG tablet TAKE 1 TABLET (20 MG TOTAL) BY MOUTH DAILY. 30 tablet 6  . venlafaxine (EFFEXOR) 37.5 MG tablet Take 37.5 mg by mouth every morning.     . vitamin C (ASCORBIC ACID) 500 MG tablet Take 500 mg by mouth daily.    Marland Kitchen zolpidem (AMBIEN) 5 MG tablet Take 1 tablet (5 mg total) by mouth at bedtime as needed for sleep. 15 tablet 0   No current facility-administered medications for this visit.     OBJECTIVE: Vitals:   02/24/16  1418  BP: (!) 174/115  Pulse: 71  Resp: 18  Temp: 97.1 F (36.2 C)     Body mass index is 31.66 kg/m.    ECOG FS:0 - Asymptomatic  General: Well-developed, well-nourished, no acute distress. Eyes: Pink conjunctiva, anicteric sclera. Breasts: Bilateral mastectomy. Lungs: Clear to auscultation bilaterally. Heart: Regular rate and rhythm. No rubs, murmurs, or gallops. Abdomen: Soft, nontender, nondistended. No organomegaly noted, normoactive bowel sounds. Musculoskeletal: No edema, cyanosis, or clubbing. Neuro: Alert, answering all questions appropriately. Cranial nerves grossly intact. Skin: No rashes or petechiae noted. Psych: Normal affect.   LAB RESULTS:  Lab Results  Component Value Date   NA 138 12/01/2015   K 3.7 12/01/2015   CL 107 12/01/2015   CO2 24 12/01/2015   GLUCOSE 98 12/01/2015   BUN 20 12/01/2015   CREATININE 0.79 12/01/2015   CALCIUM 9.1 12/01/2015   PROT 7.7 09/26/2015   ALBUMIN 4.3 09/26/2015   AST 25 09/26/2015   ALT 18 09/26/2015   ALKPHOS 70 09/26/2015    BILITOT 0.3 09/26/2015   GFRNONAA >60 12/01/2015   GFRAA >60 12/01/2015    Lab Results  Component Value Date   WBC 8.4 02/24/2016   NEUTROABS 5.1 02/24/2016   HGB 13.3 02/24/2016   HCT 40.1 02/24/2016   MCV 82.2 02/24/2016   PLT 262 02/24/2016   Lab Results  Component Value Date   IRON 91 02/24/2016   TIBC 445 02/24/2016   IRONPCTSAT 21 02/24/2016   Lab Results  Component Value Date   FERRITIN 18 02/24/2016     STUDIES: No results found.  ASSESSMENT: Cancer midline right breast, iron deficiency anemia.  PLAN:    1. Cancer midline right breast: Although patient had stage I disease, she elected mastectomy since she did not want to undergo radiation therapy. Her right mastectomy was performed in August 2016. She currently is taking tamoxifen which will complete 5 years of treatment in September 2021. Patient recently underwent prophylactic left mastectomy. Continue follow-up per [facility] plastic surgery. 2. Iron deficiency anemia: Patient has required multiple blood transfusions in the past. She has a difficult stick for both laboratory work and treatment, therefore had a port placed at the time of her mastectomy. Patient's hemoglobin and iron stores are now within normal limits. She does not require additional IV Feraheme today. Return to clinic in 3 months with repeat laboratory work and further evaluation.  3. Breast pain: Continue follow-up and evaluation for surgery.   Patient expressed understanding and was in agreement with this plan. She also understands that She can call clinic at any time with any questions, concerns, or complaints.   Malignant neoplasm of midline of right breast ([place])   Staging form: Breast, AJCC 7th Edition   - Clinical: Stage IA (T1a, N0, M0) - Signed by Forest Gleason, MD on 01/13/2015  Lloyd Huger, MD   02/24/2016 4:26 PM

## 2016-02-24 ENCOUNTER — Inpatient Hospital Stay: Payer: Medicaid Other

## 2016-02-24 ENCOUNTER — Inpatient Hospital Stay: Payer: Medicaid Other | Attending: Oncology | Admitting: Oncology

## 2016-02-24 VITALS — BP 174/115 | HR 71 | Temp 97.1°F | Resp 18 | Wt 202.2 lb

## 2016-02-24 DIAGNOSIS — I10 Essential (primary) hypertension: Secondary | ICD-10-CM | POA: Diagnosis not present

## 2016-02-24 DIAGNOSIS — C50811 Malignant neoplasm of overlapping sites of right female breast: Secondary | ICD-10-CM

## 2016-02-24 DIAGNOSIS — C50211 Malignant neoplasm of upper-inner quadrant of right female breast: Secondary | ICD-10-CM

## 2016-02-24 DIAGNOSIS — Z170 Estrogen receptor positive status [ER+]: Secondary | ICD-10-CM | POA: Diagnosis not present

## 2016-02-24 DIAGNOSIS — Z8673 Personal history of transient ischemic attack (TIA), and cerebral infarction without residual deficits: Secondary | ICD-10-CM | POA: Diagnosis not present

## 2016-02-24 DIAGNOSIS — F419 Anxiety disorder, unspecified: Secondary | ICD-10-CM | POA: Diagnosis not present

## 2016-02-24 DIAGNOSIS — D509 Iron deficiency anemia, unspecified: Secondary | ICD-10-CM | POA: Diagnosis not present

## 2016-02-24 DIAGNOSIS — F329 Major depressive disorder, single episode, unspecified: Secondary | ICD-10-CM | POA: Diagnosis not present

## 2016-02-24 DIAGNOSIS — Z79899 Other long term (current) drug therapy: Secondary | ICD-10-CM | POA: Insufficient documentation

## 2016-02-24 DIAGNOSIS — K859 Acute pancreatitis without necrosis or infection, unspecified: Secondary | ICD-10-CM | POA: Insufficient documentation

## 2016-02-24 DIAGNOSIS — Z7982 Long term (current) use of aspirin: Secondary | ICD-10-CM | POA: Insufficient documentation

## 2016-02-24 DIAGNOSIS — Z79810 Long term (current) use of selective estrogen receptor modulators (SERMs): Secondary | ICD-10-CM | POA: Diagnosis not present

## 2016-02-24 DIAGNOSIS — D508 Other iron deficiency anemias: Secondary | ICD-10-CM

## 2016-02-24 DIAGNOSIS — N83209 Unspecified ovarian cyst, unspecified side: Secondary | ICD-10-CM | POA: Diagnosis not present

## 2016-02-24 DIAGNOSIS — Z87891 Personal history of nicotine dependence: Secondary | ICD-10-CM | POA: Insufficient documentation

## 2016-02-24 DIAGNOSIS — N644 Mastodynia: Secondary | ICD-10-CM | POA: Diagnosis not present

## 2016-02-24 DIAGNOSIS — K219 Gastro-esophageal reflux disease without esophagitis: Secondary | ICD-10-CM | POA: Insufficient documentation

## 2016-02-24 DIAGNOSIS — Z9011 Acquired absence of right breast and nipple: Secondary | ICD-10-CM | POA: Diagnosis not present

## 2016-02-24 DIAGNOSIS — I429 Cardiomyopathy, unspecified: Secondary | ICD-10-CM | POA: Insufficient documentation

## 2016-02-24 LAB — CBC WITH DIFFERENTIAL/PLATELET
BASOS ABS: 0.1 10*3/uL (ref 0–0.1)
Basophils Relative: 1 %
EOS ABS: 0.1 10*3/uL (ref 0–0.7)
EOS PCT: 2 %
HCT: 40.1 % (ref 35.0–47.0)
Hemoglobin: 13.3 g/dL (ref 12.0–16.0)
LYMPHS PCT: 30 %
Lymphs Abs: 2.5 10*3/uL (ref 1.0–3.6)
MCH: 27.3 pg (ref 26.0–34.0)
MCHC: 33.2 g/dL (ref 32.0–36.0)
MCV: 82.2 fL (ref 80.0–100.0)
MONO ABS: 0.7 10*3/uL (ref 0.2–0.9)
Monocytes Relative: 8 %
Neutro Abs: 5.1 10*3/uL (ref 1.4–6.5)
Neutrophils Relative %: 59 %
PLATELETS: 262 10*3/uL (ref 150–440)
RBC: 4.87 MIL/uL (ref 3.80–5.20)
RDW: 27.2 % — AB (ref 11.5–14.5)
WBC: 8.4 10*3/uL (ref 3.6–11.0)

## 2016-02-24 LAB — IRON AND TIBC
IRON: 91 ug/dL (ref 28–170)
SATURATION RATIOS: 21 % (ref 10.4–31.8)
TIBC: 445 ug/dL (ref 250–450)
UIBC: 354 ug/dL

## 2016-02-24 LAB — FERRITIN: FERRITIN: 18 ng/mL (ref 11–307)

## 2016-02-24 MED ORDER — HEPARIN SOD (PORK) LOCK FLUSH 100 UNIT/ML IV SOLN
500.0000 [IU] | Freq: Once | INTRAVENOUS | Status: AC
  Administered 2016-02-24: 500 [IU] via INTRAVENOUS

## 2016-02-24 MED ORDER — SODIUM CHLORIDE 0.9% FLUSH
10.0000 mL | Freq: Once | INTRAVENOUS | Status: AC
  Administered 2016-02-24: 10 mL via INTRAVENOUS
  Filled 2016-02-24: qty 10

## 2016-02-24 NOTE — Progress Notes (Signed)
Complains of constant headache. Pt saw PCP who treated pt for high BP. Informed pt that needs to follow up with PCP since BP remains high at visit and that headache is most likely related to elevated BP. Pt verbalized understanding.

## 2016-02-25 ENCOUNTER — Telehealth: Payer: Self-pay | Admitting: *Deleted

## 2016-02-25 NOTE — Telephone Encounter (Signed)
Received message requesting appt. Mack Guise called stated is pt's HCPOA and would like to make an appt for this  Pt was seen in clinic yesterday. Mack Guise is not on patient's list of contacts and is not listed on pt's advanced directives as her HCPOA. Phone call will not be returned at this time. If pt requires additional appt, then the pt will need call to schedule that appt.

## 2016-04-22 ENCOUNTER — Other Ambulatory Visit: Payer: Self-pay | Admitting: Obstetrics and Gynecology

## 2016-05-25 ENCOUNTER — Other Ambulatory Visit: Payer: Self-pay | Admitting: Oncology

## 2016-05-25 NOTE — Progress Notes (Deleted)
[place]  [phone] [phone]  ID: Elizabeth Flynn OB: 1970/05/11  MR#: [id number]  OEU#:[id number]  Patient Care Team: Janalyn Shy, MD as PCP - General (Family Medicine) Rico Junker, RN as Registered Nurse (Radiology) Theodore Demark, RN as Registered Nurse Seeplaputhur Robinette Haines, MD (General Surgery)  CHIEF COMPLAINT: Cancer midline right breast, iron deficiency anemia.  INTERVAL HISTORY: Patient returns to clinic today for further evaluation and consideration of IV iron. She continues to have chest wall pain and swelling at the site of her left mastectomy and is being evaluated at [facility] by plastic surgery. She continues to tolerate tamoxifen well without significant side effects. She does did not complain of weakness and fatigue today. She has no neurologic complaints. She denies any chest pain or shortness of breath. She denies any nausea, vomiting, constipation, or diarrhea. She has a history of rectal pain and bleeding. She has no urinary complaints. Patient offers no further specific complaints today.  REVIEW OF SYSTEMS:   Review of Systems  Constitutional: Negative for fever, malaise/fatigue and weight loss.  Respiratory: Negative for cough and shortness of breath.   Cardiovascular: Negative.  Negative for chest pain and leg swelling.  Gastrointestinal: Negative for abdominal pain, blood in stool and melena.  Genitourinary: Negative.   Musculoskeletal: Negative.   Neurological: Negative.  Negative for weakness.  Psychiatric/Behavioral: Negative.  The patient is not nervous/anxious.     As per HPI. Otherwise, a complete review of systems is negative.  PAST MEDICAL HISTORY: Past Medical History:  Diagnosis Date  . Anemia   . Anxiety    takes nothing  . Blood transfusion without reported diagnosis 2016   patient has had 25 transfusions throughout her life. her blood levels run chronically low  . BRCA negative 10-16-14  . Breast  cancer ([place]) 2016   RT MASTECTOMY  . Cancer of right breast ([place]) 10/16/2014   Upper and inner quadrant right breast, Invasive ductal carcinoma, Atypical Ductal Hyperplasia, ER/PR Pos, Her 2 Neg  . Cardiomyopathy ([place])   . Depression   . Diarrhea   . Dysrhythmia    TACHYCARDIA...happens randomly. not seen by any cardiologist  . Enlarged heart   . GERD (gastroesophageal reflux disease)    h/o- no meds since hernia surgery  . Headache   . Hypertension   . Kidney stones   . MRSA infection 2009  . Ovarian cyst   . Pancreatitis   . PONV (postoperative nausea and vomiting)    DRY HEAVING AFTER BREAST LUMPECTOMY 10-2014  . Shortness of breath dyspnea    with exertion  . Stroke [facility])    Sept. 2014 rt sided bleed  . Stroke [facility])     PAST SURGICAL HISTORY: Past Surgical History:  Procedure Laterality Date  . ABDOMINAL HYSTERECTOMY    . BREAST BIOPSY Right 10/10/2014   Right breast invasive carcinoma. T1A, N0.  Marland Kitchen BREAST LUMPECTOMY WITH SENTINEL LYMPH NODE BIOPSY Right 10/23/2014   Procedure: Right breast lumpectomy with sentinel node biopsy ;  Surgeon: Christene Lye, MD;  Location: [facility] ORS;  Service: General;  Laterality: Right;  . CHOLECYSTECTOMY    . DILATATION & CURRETTAGE/HYSTEROSCOPY WITH RESECTOCOPE N/A 03/18/2015   Procedure: DILATATION & CURETTAGE/HYSTEROSCOPY WITH RESECTOCOPE;  Surgeon: Brayton Mars, MD;  Location: [facility] ORS;  Service: Gynecology;  Laterality: N/A;  . HERNIA REPAIR    . LITHOTRIPSY    . MASTECTOMY Right 2016   BREAST CA  . MASTECTOMY MODIFIED RADICAL Right 12/05/2014  Procedure: MASTECTOMY MODIFIED RADICAL;  Surgeon: Christene Lye, MD;  Location: [facility] ORS;  Service: General;  Laterality: Right;  . OOPHORECTOMY Bilateral   . PORTACATH PLACEMENT Right 11/12/2015   Procedure: INSERTION PORT-A-CATH;  Surgeon: Christene Lye, MD;  Location: [facility] ORS;  Service: General;  Laterality: Right;  . TONSILLECTOMY    . TOTAL MASTECTOMY Left  11/12/2015   Procedure: TOTAL MASTECTOMY;  Surgeon: Christene Lye, MD;  Location: [facility] ORS;  Service: General;  Laterality: Left;  . TUBAL LIGATION    . VAGINAL HYSTERECTOMY Bilateral 04/29/2015   Procedure: HYSTERECTOMY VAGINAL/ BILATERAL SALPINGOOPHORECTOMY;  Surgeon: Brayton Mars, MD;  Location: [facility] ORS;  Service: Gynecology;  Laterality: Bilateral;    FAMILY HISTORY: Family History  Problem Relation Age of Onset  . Breast cancer Cousin 2  . Heart disease Maternal Grandmother   . Diabetes Paternal Grandmother   . Heart disease Paternal Grandmother   . Ovarian cancer Neg Hx   . Colon cancer Neg Hx        ADVANCED DIRECTIVES:    HEALTH MAINTENANCE: Social History  Substance Use Topics  . Smoking status: Former Smoker    Types: Cigarettes    Quit date: 04/26/1995  . Smokeless tobacco: Never Used     Comment: no passive smoke in home  . Alcohol use No     Comment: SOBER FOR 3 [id number])     Colonoscopy:  PAP:  Bone density:  Lipid panel:  Allergies  Allergen Reactions  . Hydrocodone-Acetaminophen Hives and Itching  . Reglan [Metoclopramide] Other (See Comments)    "freaks me out, makes me nervous"  . Clindamycin/Lincomycin Rash  . Morphine And Related Rash    Current Outpatient Prescriptions  Medication Sig Dispense Refill  . acetaminophen (TYLENOL) 500 MG tablet Take 2 tablets (1,000 mg total) by mouth every 6 (six) hours as needed. For pain 30 tablet 0  . amLODipine (NORVASC) 10 MG tablet Take 1 tablet (10 mg total) by mouth daily. (Patient taking differently: Take 10 mg by mouth every morning. ) 30 tablet 0  . aspirin 325 MG tablet Take 325 mg by mouth as needed.    . ferrous fumarate-iron polysaccharide complex (TANDEM) 162-115.2 MG CAPS capsule Take 1 capsule by mouth daily with breakfast. 30 capsule 6  . FLUoxetine (PROZAC) 20 MG capsule TAKE ONE TABLET BY MOUTH DAILY FOR DEPRESSION-am  5  . lamoTRIgine (LAMICTAL) 25 MG tablet Take 25 mg by  mouth every morning.     Marland Kitchen lisinopril (PRINIVIL,ZESTRIL) 20 MG tablet Take 1 tablet (20 mg total) by mouth every evening. (Patient taking differently: Take 20 mg by mouth every morning. ) 30 tablet 0  . meloxicam (MOBIC) 7.5 MG tablet TAKE 1 TABLET (7.5 MG TOTAL) BY MOUTH DAILY. 30 tablet 0  . promethazine (PHENERGAN) 12.5 MG suppository Place 1 suppository (12.5 mg total) rectally every 6 (six) hours as needed for nausea or vomiting. 12 each 0  . tamoxifen (NOLVADEX) 20 MG tablet TAKE 1 TABLET (20 MG TOTAL) BY MOUTH DAILY. 30 tablet 6  . venlafaxine (EFFEXOR) 37.5 MG tablet Take 37.5 mg by mouth every morning.     . vitamin C (ASCORBIC ACID) 500 MG tablet Take 500 mg by mouth daily.    Marland Kitchen zolpidem (AMBIEN) 5 MG tablet Take 1 tablet (5 mg total) by mouth at bedtime as needed for sleep. 15 tablet 0   No current facility-administered medications for this visit.     OBJECTIVE: There were no vitals  filed for this visit.   There is no height or weight on file to calculate BMI.    ECOG FS:0 - Asymptomatic  General: Well-developed, well-nourished, no acute distress. Eyes: Pink conjunctiva, anicteric sclera. Breasts: Bilateral mastectomy. Lungs: Clear to auscultation bilaterally. Heart: Regular rate and rhythm. No rubs, murmurs, or gallops. Abdomen: Soft, nontender, nondistended. No organomegaly noted, normoactive bowel sounds. Musculoskeletal: No edema, cyanosis, or clubbing. Neuro: Alert, answering all questions appropriately. Cranial nerves grossly intact. Skin: No rashes or petechiae noted. Psych: Normal affect.   LAB RESULTS:  Lab Results  Component Value Date   NA 138 12/01/2015   K 3.7 12/01/2015   CL 107 12/01/2015   CO2 24 12/01/2015   GLUCOSE 98 12/01/2015   BUN 20 12/01/2015   CREATININE 0.79 12/01/2015   CALCIUM 9.1 12/01/2015   PROT 7.7 09/26/2015   ALBUMIN 4.3 09/26/2015   AST 25 09/26/2015   ALT 18 09/26/2015   ALKPHOS 70 09/26/2015   BILITOT 0.3 09/26/2015    GFRNONAA >60 12/01/2015   GFRAA >60 12/01/2015    Lab Results  Component Value Date   WBC 8.4 02/24/2016   NEUTROABS 5.1 02/24/2016   HGB 13.3 02/24/2016   HCT 40.1 02/24/2016   MCV 82.2 02/24/2016   PLT 262 02/24/2016   Lab Results  Component Value Date   IRON 91 02/24/2016   TIBC 445 02/24/2016   IRONPCTSAT 21 02/24/2016   Lab Results  Component Value Date   FERRITIN 18 02/24/2016     STUDIES: No results found.  ASSESSMENT: Cancer midline right breast, iron deficiency anemia.  PLAN:    1. Cancer midline right breast: Although patient had stage I disease, she elected mastectomy since she did not want to undergo radiation therapy. Her right mastectomy was performed in August 2016. She currently is taking tamoxifen which will complete 5 years of treatment in September 2021. Patient recently underwent prophylactic left mastectomy. Continue follow-up per [facility] plastic surgery. 2. Iron deficiency anemia: Patient has required multiple blood transfusions in the past. She has a difficult stick for both laboratory work and treatment, therefore had a port placed at the time of her mastectomy. Patient's hemoglobin and iron stores are now within normal limits. She does not require additional IV Feraheme today. Return to clinic in 3 months with repeat laboratory work and further evaluation.  3. Breast pain: Continue follow-up and evaluation for surgery.   Patient expressed understanding and was in agreement with this plan. She also understands that She can call clinic at any time with any questions, concerns, or complaints.   Cancer Staging Malignant neoplasm of midline of right breast ([place]) Staging form: Breast, AJCC 7th Edition - Clinical: Stage IA (T1a, N0, M0) - Signed by Forest Gleason, MD on 01/13/2015   Lloyd Huger, MD   05/25/2016 5:31 PM

## 2016-05-26 ENCOUNTER — Inpatient Hospital Stay: Payer: Medicaid Other

## 2016-05-26 ENCOUNTER — Inpatient Hospital Stay: Payer: Medicaid Other | Admitting: Oncology

## 2016-06-13 ENCOUNTER — Other Ambulatory Visit: Payer: Self-pay | Admitting: Oncology

## 2016-06-13 DIAGNOSIS — C50911 Malignant neoplasm of unspecified site of right female breast: Secondary | ICD-10-CM

## 2016-06-16 NOTE — Progress Notes (Deleted)
[place]  Telephone:(336) [phone] Fax:(336) [phone]  ID: Elizabeth Flynn OB: 04/27/1970  MR#: [id number]  HCW#:[id number]  Patient Care Team: Janalyn Shy, MD as PCP - General (Family Medicine) Rico Junker, RN as Registered Nurse (Radiology) Theodore Demark, RN as Registered Nurse Seeplaputhur Robinette Haines, MD (General Surgery)  CHIEF COMPLAINT: Cancer midline right breast, iron deficiency anemia.  INTERVAL HISTORY: Patient returns to clinic today for further evaluation and consideration of IV iron. She continues to have chest wall pain and swelling at the site of her left mastectomy and is being evaluated at [facility] by plastic surgery. She continues to tolerate tamoxifen well without significant side effects. She does did not complain of weakness and fatigue today. She has no neurologic complaints. She denies any chest pain or shortness of breath. She denies any nausea, vomiting, constipation, or diarrhea. She has a history of rectal pain and bleeding. She has no urinary complaints. Patient offers no further specific complaints today.  REVIEW OF SYSTEMS:   Review of Systems  Constitutional: Negative for fever, malaise/fatigue and weight loss.  Respiratory: Negative for cough and shortness of breath.   Cardiovascular: Negative.  Negative for chest pain and leg swelling.  Gastrointestinal: Negative for abdominal pain, blood in stool and melena.  Genitourinary: Negative.   Musculoskeletal: Negative.   Neurological: Negative.  Negative for weakness.  Psychiatric/Behavioral: Negative.  The patient is not nervous/anxious.     As per HPI. Otherwise, a complete review of systems is negative.  PAST MEDICAL HISTORY: Past Medical History:  Diagnosis Date  . Anemia   . Anxiety    takes nothing  . Blood transfusion without reported diagnosis 2016   patient has had 25 transfusions throughout her life. her blood levels run chronically low  . BRCA negative 10-16-14  . Breast  cancer ([place]) 2016   RT MASTECTOMY  . Cancer of right breast ([place]) 10/16/2014   Upper and inner quadrant right breast, Invasive ductal carcinoma, Atypical Ductal Hyperplasia, ER/PR Pos, Her 2 Neg  . Cardiomyopathy ([place])   . Depression   . Diarrhea   . Dysrhythmia    TACHYCARDIA...happens randomly. not seen by any cardiologist  . Enlarged heart   . GERD (gastroesophageal reflux disease)    h/o- no meds since hernia surgery  . Headache   . Hypertension   . Kidney stones   . MRSA infection 2009  . Ovarian cyst   . Pancreatitis   . PONV (postoperative nausea and vomiting)    DRY HEAVING AFTER BREAST LUMPECTOMY 10-2014  . Shortness of breath dyspnea    with exertion  . Stroke [facility])    Sept. 2014 rt sided bleed  . Stroke [facility])     PAST SURGICAL HISTORY: Past Surgical History:  Procedure Laterality Date  . ABDOMINAL HYSTERECTOMY    . BREAST BIOPSY Right 10/10/2014   Right breast invasive carcinoma. T1A, N0.  Marland Kitchen BREAST LUMPECTOMY WITH SENTINEL LYMPH NODE BIOPSY Right 10/23/2014   Procedure: Right breast lumpectomy with sentinel node biopsy ;  Surgeon: Christene Lye, MD;  Location: [facility] ORS;  Service: General;  Laterality: Right;  . CHOLECYSTECTOMY    . DILATATION & CURRETTAGE/HYSTEROSCOPY WITH RESECTOCOPE N/A 03/18/2015   Procedure: DILATATION & CURETTAGE/HYSTEROSCOPY WITH RESECTOCOPE;  Surgeon: Brayton Mars, MD;  Location: [facility] ORS;  Service: Gynecology;  Laterality: N/A;  . HERNIA REPAIR    . LITHOTRIPSY    . MASTECTOMY Right 2016   BREAST CA  . MASTECTOMY MODIFIED RADICAL Right 12/05/2014  Procedure: MASTECTOMY MODIFIED RADICAL;  Surgeon: Christene Lye, MD;  Location: [facility] ORS;  Service: General;  Laterality: Right;  . OOPHORECTOMY Bilateral   . PORTACATH PLACEMENT Right 11/12/2015   Procedure: INSERTION PORT-A-CATH;  Surgeon: Christene Lye, MD;  Location: [facility] ORS;  Service: General;  Laterality: Right;  . TONSILLECTOMY    . TOTAL MASTECTOMY Left  11/12/2015   Procedure: TOTAL MASTECTOMY;  Surgeon: Christene Lye, MD;  Location: [facility] ORS;  Service: General;  Laterality: Left;  . TUBAL LIGATION    . VAGINAL HYSTERECTOMY Bilateral 04/29/2015   Procedure: HYSTERECTOMY VAGINAL/ BILATERAL SALPINGOOPHORECTOMY;  Surgeon: Brayton Mars, MD;  Location: [facility] ORS;  Service: Gynecology;  Laterality: Bilateral;    FAMILY HISTORY: Family History  Problem Relation Age of Onset  . Breast cancer Cousin 65  . Heart disease Maternal Grandmother   . Diabetes Paternal Grandmother   . Heart disease Paternal Grandmother   . Ovarian cancer Neg Hx   . Colon cancer Neg Hx        ADVANCED DIRECTIVES:    HEALTH MAINTENANCE: Social History  Substance Use Topics  . Smoking status: Former Smoker    Types: Cigarettes    Quit date: 04/26/1995  . Smokeless tobacco: Never Used     Comment: no passive smoke in home  . Alcohol use No     Comment: SOBER FOR 3 [id number])     Colonoscopy:  PAP:  Bone density:  Lipid panel:  Allergies  Allergen Reactions  . Hydrocodone-Acetaminophen Hives and Itching  . Reglan [Metoclopramide] Other (See Comments)    "freaks me out, makes me nervous"  . Clindamycin/Lincomycin Rash  . Morphine And Related Rash    Current Outpatient Prescriptions  Medication Sig Dispense Refill  . acetaminophen (TYLENOL) 500 MG tablet Take 2 tablets (1,000 mg total) by mouth every 6 (six) hours as needed. For pain 30 tablet 0  . amLODipine (NORVASC) 10 MG tablet Take 1 tablet (10 mg total) by mouth daily. (Patient taking differently: Take 10 mg by mouth every morning. ) 30 tablet 0  . aspirin 325 MG tablet Take 325 mg by mouth as needed.    . ferrous fumarate-iron polysaccharide complex (TANDEM) 162-115.2 MG CAPS capsule Take 1 capsule by mouth daily with breakfast. 30 capsule 6  . FLUoxetine (PROZAC) 20 MG capsule TAKE ONE TABLET BY MOUTH DAILY FOR DEPRESSION-am  5  . lamoTRIgine (LAMICTAL) 25 MG tablet Take 25 mg by  mouth every morning.     Marland Kitchen lisinopril (PRINIVIL,ZESTRIL) 20 MG tablet Take 1 tablet (20 mg total) by mouth every evening. (Patient taking differently: Take 20 mg by mouth every morning. ) 30 tablet 0  . meloxicam (MOBIC) 7.5 MG tablet TAKE 1 TABLET (7.5 MG TOTAL) BY MOUTH DAILY. 30 tablet 0  . promethazine (PHENERGAN) 12.5 MG suppository Place 1 suppository (12.5 mg total) rectally every 6 (six) hours as needed for nausea or vomiting. 12 each 0  . tamoxifen (NOLVADEX) 20 MG tablet TAKE 1 TABLET (20 MG TOTAL) BY MOUTH DAILY. 90 tablet 1  . venlafaxine (EFFEXOR) 37.5 MG tablet Take 37.5 mg by mouth every morning.     . vitamin C (ASCORBIC ACID) 500 MG tablet Take 500 mg by mouth daily.    Marland Kitchen zolpidem (AMBIEN) 5 MG tablet Take 1 tablet (5 mg total) by mouth at bedtime as needed for sleep. 15 tablet 0   No current facility-administered medications for this visit.     OBJECTIVE: There were no vitals  filed for this visit.   There is no height or weight on file to calculate BMI.    ECOG FS:0 - Asymptomatic  General: Well-developed, well-nourished, no acute distress. Eyes: Pink conjunctiva, anicteric sclera. Breasts: Bilateral mastectomy. Lungs: Clear to auscultation bilaterally. Heart: Regular rate and rhythm. No rubs, murmurs, or gallops. Abdomen: Soft, nontender, nondistended. No organomegaly noted, normoactive bowel sounds. Musculoskeletal: No edema, cyanosis, or clubbing. Neuro: Alert, answering all questions appropriately. Cranial nerves grossly intact. Skin: No rashes or petechiae noted. Psych: Normal affect.   LAB RESULTS:  Lab Results  Component Value Date   NA 138 12/01/2015   K 3.7 12/01/2015   CL 107 12/01/2015   CO2 24 12/01/2015   GLUCOSE 98 12/01/2015   BUN 20 12/01/2015   CREATININE 0.79 12/01/2015   CALCIUM 9.1 12/01/2015   PROT 7.7 09/26/2015   ALBUMIN 4.3 09/26/2015   AST 25 09/26/2015   ALT 18 09/26/2015   ALKPHOS 70 09/26/2015   BILITOT 0.3 09/26/2015    GFRNONAA >60 12/01/2015   GFRAA >60 12/01/2015    Lab Results  Component Value Date   WBC 8.4 02/24/2016   NEUTROABS 5.1 02/24/2016   HGB 13.3 02/24/2016   HCT 40.1 02/24/2016   MCV 82.2 02/24/2016   PLT 262 02/24/2016   Lab Results  Component Value Date   IRON 91 02/24/2016   TIBC 445 02/24/2016   IRONPCTSAT 21 02/24/2016   Lab Results  Component Value Date   FERRITIN 18 02/24/2016     STUDIES: No results found.  ASSESSMENT: Cancer midline right breast, iron deficiency anemia.  PLAN:    1. Cancer midline right breast: Although patient had stage I disease, she elected mastectomy since she did not want to undergo radiation therapy. Her right mastectomy was performed in August 2016. She currently is taking tamoxifen which will complete 5 years of treatment in September 2021. Patient recently underwent prophylactic left mastectomy. Continue follow-up per [facility] plastic surgery. 2. Iron deficiency anemia: Patient has required multiple blood transfusions in the past. She has a difficult stick for both laboratory work and treatment, therefore had a port placed at the time of her mastectomy. Patient's hemoglobin and iron stores are now within normal limits. She does not require additional IV Feraheme today. Return to clinic in 3 months with repeat laboratory work and further evaluation.  3. Breast pain: Continue follow-up and evaluation for surgery.   Patient expressed understanding and was in agreement with this plan. She also understands that She can call clinic at any time with any questions, concerns, or complaints.   Cancer Staging Malignant neoplasm of midline of right breast ([place]) Staging form: Breast, AJCC 7th Edition - Clinical: Stage IA (T1a, N0, M0) - Signed by Forest Gleason, MD on 01/13/2015   Lloyd Huger, MD   06/16/2016 8:54 AM

## 2016-06-17 ENCOUNTER — Inpatient Hospital Stay: Payer: Medicaid Other

## 2016-06-17 ENCOUNTER — Inpatient Hospital Stay: Payer: Medicaid Other | Admitting: Oncology

## 2016-08-05 NOTE — Progress Notes (Deleted)
[place]  [phone] [phone]  ID: Elizabeth Flynn OB: 1970/10/11  MR#: [id number]  WHQ#:[id number]  Patient Care Team: Janalyn Shy, MD as PCP - General (Family Medicine) Rico Junker, RN as Registered Nurse (Radiology) Theodore Demark, RN as Registered Nurse Seeplaputhur Robinette Haines, MD (General Surgery)  CHIEF COMPLAINT: Cancer midline right breast, iron deficiency anemia.  INTERVAL HISTORY: Patient returns to clinic today for further evaluation and consideration of IV iron. She continues to have chest wall pain and swelling at the site of her left mastectomy and is being evaluated at [facility] by plastic surgery. She continues to tolerate tamoxifen well without significant side effects. She does did not complain of weakness and fatigue today. She has no neurologic complaints. She denies any chest pain or shortness of breath. She denies any nausea, vomiting, constipation, or diarrhea. She has a history of rectal pain and bleeding. She has no urinary complaints. Patient offers no further specific complaints today.  REVIEW OF SYSTEMS:   Review of Systems  Constitutional: Negative for fever, malaise/fatigue and weight loss.  Respiratory: Negative for cough and shortness of breath.   Cardiovascular: Negative.  Negative for chest pain and leg swelling.  Gastrointestinal: Negative for abdominal pain, blood in stool and melena.  Genitourinary: Negative.   Musculoskeletal: Negative.   Neurological: Negative.  Negative for weakness.  Psychiatric/Behavioral: Negative.  The patient is not nervous/anxious.     As per HPI. Otherwise, a complete review of systems is negative.  PAST MEDICAL HISTORY: Past Medical History:  Diagnosis Date  . Anemia   . Anxiety    takes nothing  . Blood transfusion without reported diagnosis 2016   patient has had 25 transfusions throughout her life. her blood levels run chronically low  . BRCA negative 10-16-14  . Breast  cancer ([employer]) 2016   RT MASTECTOMY  . Cancer of right breast ([place]) 10/16/2014   Upper and inner quadrant right breast, Invasive ductal carcinoma, Atypical Ductal Hyperplasia, ER/PR Pos, Her 2 Neg  . Cardiomyopathy ([place])   . Depression   . Diarrhea   . Dysrhythmia    TACHYCARDIA...happens randomly. not seen by any cardiologist  . Enlarged heart   . GERD (gastroesophageal reflux disease)    h/o- no meds since hernia surgery  . Headache   . Hypertension   . Kidney stones   . MRSA infection 2009  . Ovarian cyst   . Pancreatitis   . PONV (postoperative nausea and vomiting)    DRY HEAVING AFTER BREAST LUMPECTOMY 10-2014  . Shortness of breath dyspnea    with exertion  . Stroke [facility]    Sept. 2014 rt sided bleed  . Stroke [facility])     PAST SURGICAL HISTORY: Past Surgical History:  Procedure Laterality Date  . ABDOMINAL HYSTERECTOMY    . BREAST BIOPSY Right 10/10/2014   Right breast invasive carcinoma. T1A, N0.  Marland Kitchen BREAST LUMPECTOMY WITH SENTINEL LYMPH NODE BIOPSY Right 10/23/2014   Procedure: Right breast lumpectomy with sentinel node biopsy ;  Surgeon: Christene Lye, MD;  Location: [facility] ORS;  Service: General;  Laterality: Right;  . CHOLECYSTECTOMY    . DILATATION & CURRETTAGE/HYSTEROSCOPY WITH RESECTOCOPE N/A 03/18/2015   Procedure: DILATATION & CURETTAGE/HYSTEROSCOPY WITH RESECTOCOPE;  Surgeon: Brayton Mars, MD;  Location: [facility] ORS;  Service: Gynecology;  Laterality: N/A;  . HERNIA REPAIR    . LITHOTRIPSY    . MASTECTOMY Right 2016   BREAST CA  . MASTECTOMY MODIFIED RADICAL Right 12/05/2014  Procedure: MASTECTOMY MODIFIED RADICAL;  Surgeon: Christene Lye, MD;  Location: [facility] ORS;  Service: General;  Laterality: Right;  . OOPHORECTOMY Bilateral   . PORTACATH PLACEMENT Right 11/12/2015   Procedure: INSERTION PORT-A-CATH;  Surgeon: Christene Lye, MD;  Location: [facility] ORS;  Service: General;  Laterality: Right;  . TONSILLECTOMY    . TOTAL MASTECTOMY Left  11/12/2015   Procedure: TOTAL MASTECTOMY;  Surgeon: Christene Lye, MD;  Location: [facility] ORS;  Service: General;  Laterality: Left;  . TUBAL LIGATION    . VAGINAL HYSTERECTOMY Bilateral 04/29/2015   Procedure: HYSTERECTOMY VAGINAL/ BILATERAL SALPINGOOPHORECTOMY;  Surgeon: Brayton Mars, MD;  Location: [facility] ORS;  Service: Gynecology;  Laterality: Bilateral;    FAMILY HISTORY: Family History  Problem Relation Age of Onset  . Breast cancer Cousin 3  . Heart disease Maternal Grandmother   . Diabetes Paternal Grandmother   . Heart disease Paternal Grandmother   . Ovarian cancer Neg Hx   . Colon cancer Neg Hx        ADVANCED DIRECTIVES:    HEALTH MAINTENANCE: Social History  Substance Use Topics  . Smoking status: Former Smoker    Types: Cigarettes    Quit date: 04/26/1995  . Smokeless tobacco: Never Used     Comment: no passive smoke in home  . Alcohol use No     Comment: SOBER FOR 3 [id number])     Colonoscopy:  PAP:  Bone density:  Lipid panel:  Allergies  Allergen Reactions  . Hydrocodone-Acetaminophen Hives and Itching  . Reglan [Metoclopramide] Other (See Comments)    "freaks me out, makes me nervous"  . Clindamycin/Lincomycin Rash  . Morphine And Related Rash    Current Outpatient Prescriptions  Medication Sig Dispense Refill  . acetaminophen (TYLENOL) 500 MG tablet Take 2 tablets (1,000 mg total) by mouth every 6 (six) hours as needed. For pain 30 tablet 0  . amLODipine (NORVASC) 10 MG tablet Take 1 tablet (10 mg total) by mouth daily. (Patient taking differently: Take 10 mg by mouth every morning. ) 30 tablet 0  . aspirin 325 MG tablet Take 325 mg by mouth as needed.    . ferrous fumarate-iron polysaccharide complex (TANDEM) 162-115.2 MG CAPS capsule Take 1 capsule by mouth daily with breakfast. 30 capsule 6  . FLUoxetine (PROZAC) 20 MG capsule TAKE ONE TABLET BY MOUTH DAILY FOR DEPRESSION-am  5  . lamoTRIgine (LAMICTAL) 25 MG tablet Take 25 mg by  mouth every morning.     Marland Kitchen lisinopril (PRINIVIL,ZESTRIL) 20 MG tablet Take 1 tablet (20 mg total) by mouth every evening. (Patient taking differently: Take 20 mg by mouth every morning. ) 30 tablet 0  . meloxicam (MOBIC) 7.5 MG tablet TAKE 1 TABLET (7.5 MG TOTAL) BY MOUTH DAILY. 30 tablet 0  . promethazine (PHENERGAN) 12.5 MG suppository Place 1 suppository (12.5 mg total) rectally every 6 (six) hours as needed for nausea or vomiting. 12 each 0  . tamoxifen (NOLVADEX) 20 MG tablet TAKE 1 TABLET (20 MG TOTAL) BY MOUTH DAILY. 90 tablet 1  . venlafaxine (EFFEXOR) 37.5 MG tablet Take 37.5 mg by mouth every morning.     . vitamin C (ASCORBIC ACID) 500 MG tablet Take 500 mg by mouth daily.    Marland Kitchen zolpidem (AMBIEN) 5 MG tablet Take 1 tablet (5 mg total) by mouth at bedtime as needed for sleep. 15 tablet 0   No current facility-administered medications for this visit.     OBJECTIVE: There were no vitals  filed for this visit.   There is no height or weight on file to calculate BMI.    ECOG FS:0 - Asymptomatic  General: Well-developed, well-nourished, no acute distress. Eyes: Pink conjunctiva, anicteric sclera. Breasts: Bilateral mastectomy. Lungs: Clear to auscultation bilaterally. Heart: Regular rate and rhythm. No rubs, murmurs, or gallops. Abdomen: Soft, nontender, nondistended. No organomegaly noted, normoactive bowel sounds. Musculoskeletal: No edema, cyanosis, or clubbing. Neuro: Alert, answering all questions appropriately. Cranial nerves grossly intact. Skin: No rashes or petechiae noted. Psych: Normal affect.   LAB RESULTS:  Lab Results  Component Value Date   NA 138 12/01/2015   K 3.7 12/01/2015   CL 107 12/01/2015   CO2 24 12/01/2015   GLUCOSE 98 12/01/2015   BUN 20 12/01/2015   CREATININE 0.79 12/01/2015   CALCIUM 9.1 12/01/2015   PROT 7.7 09/26/2015   ALBUMIN 4.3 09/26/2015   AST 25 09/26/2015   ALT 18 09/26/2015   ALKPHOS 70 09/26/2015   BILITOT 0.3 09/26/2015    GFRNONAA >60 12/01/2015   GFRAA >60 12/01/2015    Lab Results  Component Value Date   WBC 8.4 02/24/2016   NEUTROABS 5.1 02/24/2016   HGB 13.3 02/24/2016   HCT 40.1 02/24/2016   MCV 82.2 02/24/2016   PLT 262 02/24/2016   Lab Results  Component Value Date   IRON 91 02/24/2016   TIBC 445 02/24/2016   IRONPCTSAT 21 02/24/2016   Lab Results  Component Value Date   FERRITIN 18 02/24/2016     STUDIES: No results found.  ASSESSMENT: Cancer midline right breast, iron deficiency anemia.  PLAN:    1. Cancer midline right breast: Although patient had stage I disease, she elected mastectomy since she did not want to undergo radiation therapy. Her right mastectomy was performed in August 2016. She currently is taking tamoxifen which will complete 5 years of treatment in September 2021. Patient recently underwent prophylactic left mastectomy. Continue follow-up per [facility] plastic surgery. 2. Iron deficiency anemia: Patient has required multiple blood transfusions in the past. She has a difficult stick for both laboratory work and treatment, therefore had a port placed at the time of her mastectomy. Patient's hemoglobin and iron stores are now within normal limits. She does not require additional IV Feraheme today. Return to clinic in 3 months with repeat laboratory work and further evaluation.  3. Breast pain: Continue follow-up and evaluation for surgery.   Patient expressed understanding and was in agreement with this plan. She also understands that She can call clinic at any time with any questions, concerns, or complaints.   Cancer Staging Malignant neoplasm of midline of right breast ([place]) Staging form: Breast, AJCC 7th Edition - Clinical: Stage IA (T1a, N0, M0) - Signed by Forest Gleason, MD on 01/13/2015   Lloyd Huger, MD   08/05/2016 11:19 PM

## 2016-08-06 ENCOUNTER — Inpatient Hospital Stay: Payer: Medicaid Other

## 2016-08-06 ENCOUNTER — Inpatient Hospital Stay: Payer: Medicaid Other | Admitting: Oncology

## 2016-08-10 ENCOUNTER — Telehealth: Payer: Self-pay

## 2016-08-10 NOTE — Telephone Encounter (Signed)
Patient is being sent a dismissal from the practice letter. She has no showed several appointments. Tracking number [id number]  Certified letter

## 2016-08-10 NOTE — Telephone Encounter (Signed)
-----   Message from Lloyd Huger, MD sent at 08/10/2016  3:39 PM EDT ----- Regarding: RE: Dissmissal From Clinic Yes, please.  ----- Message ----- From: Luretha Murphy, CMA Sent: 08/10/2016   2:29 PM To: Lloyd Huger, MD Subject: Melton Alar: Ward From Clinic                     Dismissal letter? ----- Message ----- From: Wallene Dales Sent: 08/06/2016   2:34 PM To: Lloyd Huger, MD, Luretha Murphy, CMA Subject: Dissmissal From Clinic                         Pt has no showed 3 times for sheduled appt. I'm not going to reschedule her at this time. We may need to send her a letter about being dismissed from the clinic for No call, No show.  Thank you

## 2016-08-20 ENCOUNTER — Telehealth: Payer: Self-pay | Admitting: *Deleted

## 2016-08-20 NOTE — Telephone Encounter (Signed)
Called to report that she knows she has missed several appts with Dr Grayland Ormond, but she has been having reconstructive surgery for a couple of months and she also moved. Does not want Dr Grayland Ormond to think she has forgotten about him as her MD

## 2016-09-15 ENCOUNTER — Encounter: Payer: Self-pay | Admitting: *Deleted

## 2016-09-15 NOTE — Progress Notes (Signed)
Faxed [employer] recertification to [employer].

## 2016-11-08 IMAGING — DX DG CHEST 1V PORT
1 series · 1 of 1 positions shown · non-contrast
Comparison: Chest x-ray 06/20/2015

CLINICAL DATA: 45-year-old female with a history of port catheter
placement.

EXAM:
PORTABLE CHEST 1 VIEW

[chest ap]
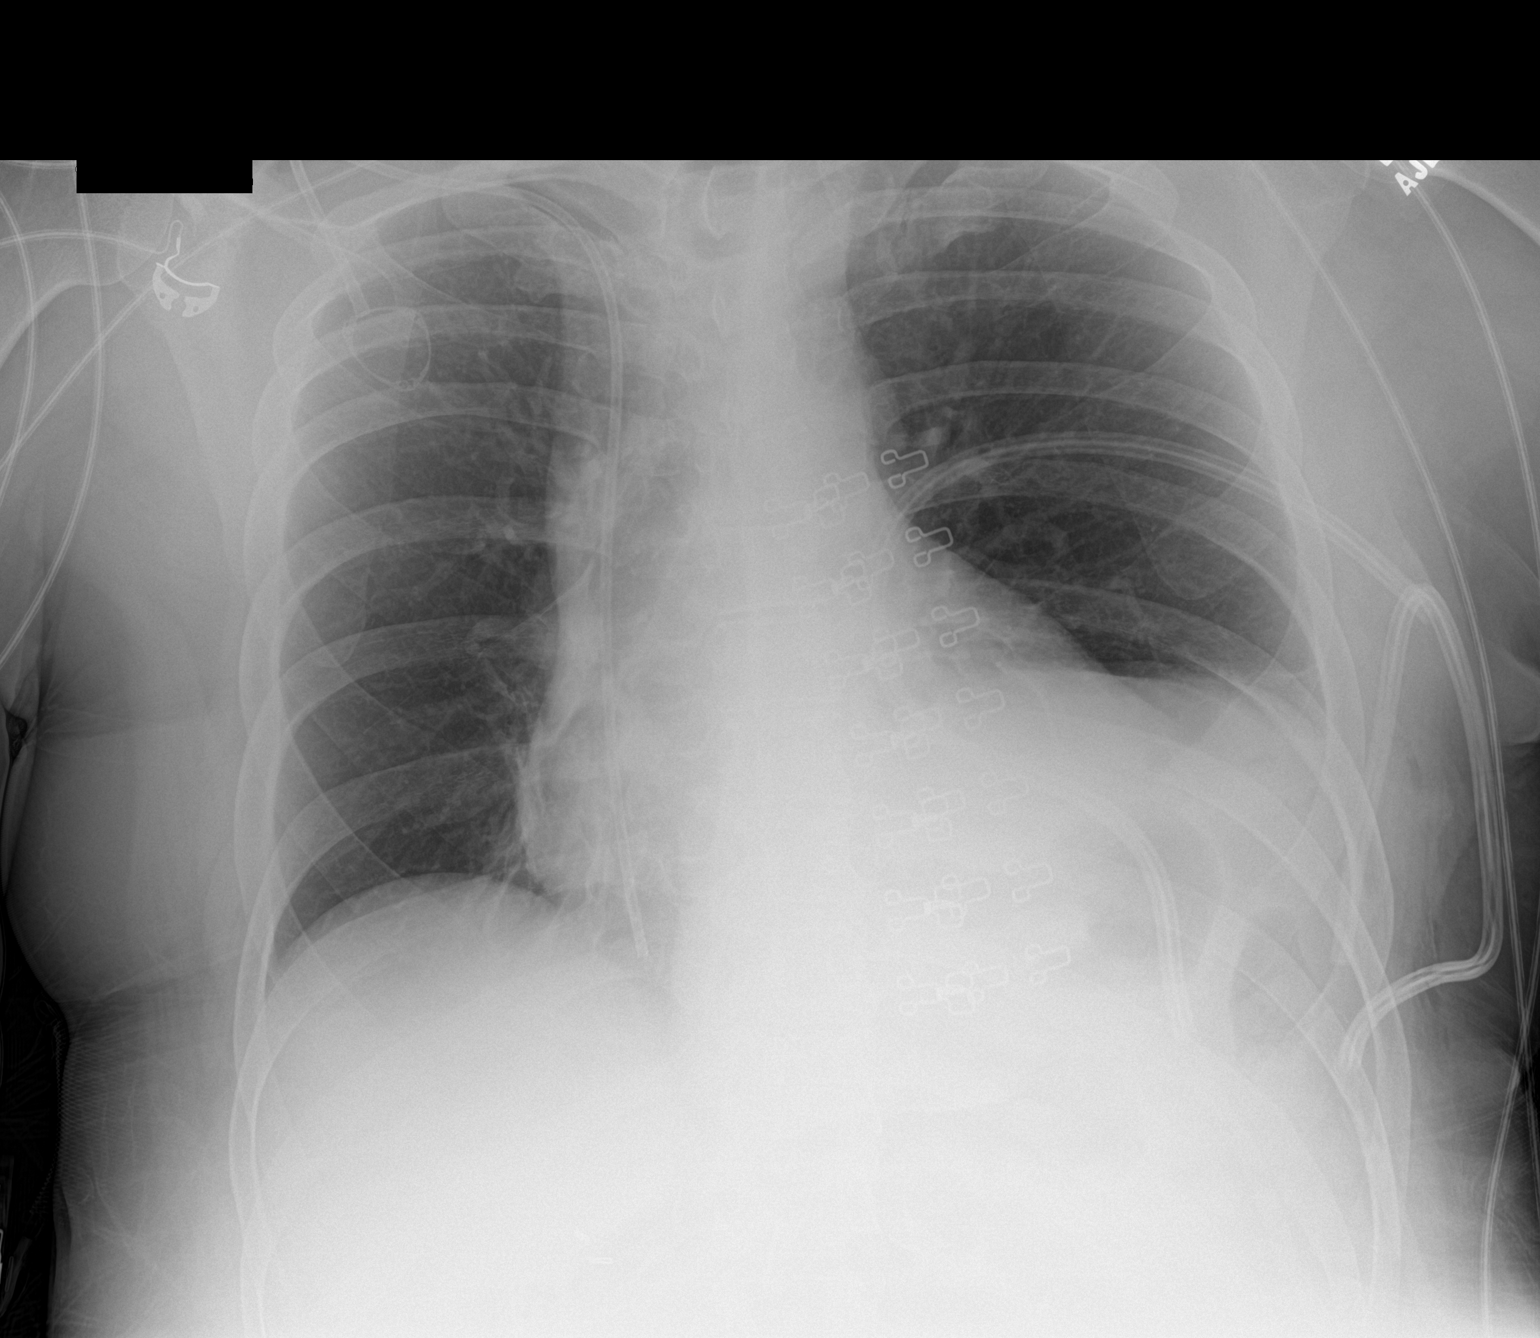

[1 of 1 positions shown; findings below may reference images not displayed]

FINDINGS: Cardiomediastinal silhouette unchanged in size and contour. Opacity
at the left base obscuring the left heart border, left hemidiaphragm
and blunting of left costophrenic angle. Overall, this is similar to
comparison with prior chest x-ray when there was asymmetric
elevation of left hemidiaphragm.

Surgical drains project over the inferior left chest.

Interval placement of right port catheter via subclavian approach.
Catheter tip appears to terminate in the right atrium.

No pneumothorax.
IMPRESSION: Interval placement of right port catheter via subclavian approach.
Tip of the catheter appears to terminate right atrium. No
complicating features.

Opacity at the left base, may represent atelectasis, pleural fluid,
or consolidation.

Surgical drains project over the lower left chest wall.

## 2022-04-10 ENCOUNTER — Encounter: Payer: Self-pay | Admitting: Emergency Medicine

## 2022-04-10 ENCOUNTER — Encounter: Payer: Self-pay | Admitting: Oncology

## 2022-04-10 ENCOUNTER — Ambulatory Visit
Admission: EM | Admit: 2022-04-10 | Discharge: 2022-04-10 | Disposition: A | Discharge status: Home / Self Care | Payer: Medicaid Other | Attending: Physician Assistant | Admitting: Physician Assistant

## 2022-04-10 DIAGNOSIS — Z1152 Encounter for screening for COVID-19: Secondary | ICD-10-CM | POA: Insufficient documentation

## 2022-04-10 DIAGNOSIS — Z79899 Other long term (current) drug therapy: Secondary | ICD-10-CM | POA: Insufficient documentation

## 2022-04-10 DIAGNOSIS — J069 Acute upper respiratory infection, unspecified: Secondary | ICD-10-CM | POA: Diagnosis not present

## 2022-04-10 DIAGNOSIS — R112 Nausea with vomiting, unspecified: Secondary | ICD-10-CM | POA: Diagnosis present

## 2022-04-10 DIAGNOSIS — K0889 Other specified disorders of teeth and supporting structures: Secondary | ICD-10-CM

## 2022-04-10 DIAGNOSIS — Z792 Long term (current) use of antibiotics: Secondary | ICD-10-CM | POA: Insufficient documentation

## 2022-04-10 DIAGNOSIS — R051 Acute cough: Secondary | ICD-10-CM | POA: Diagnosis present

## 2022-04-10 LAB — SARS CORONAVIRUS 2 BY RT PCR: SARS Coronavirus 2 by RT PCR: NEGATIVE

## 2022-04-10 LAB — RAPID INFLUENZA A&B ANTIGENS
Influenza A (ARMC): NEGATIVE
Influenza B (ARMC): NEGATIVE

## 2022-04-10 MED ORDER — ONDANSETRON 4 MG PO TBDP: 4.0000 mg | ORAL_TABLET | Freq: Four times a day (QID) | ORAL | 0 refills | Status: AC | PRN

## 2022-04-10 MED ORDER — AMOXICILLIN-POT CLAVULANATE 875-125 MG PO TABS: 1.0000 | ORAL_TABLET | Freq: Two times a day (BID) | ORAL | 0 refills | Status: AC

## 2022-04-10 NOTE — Discharge Instructions (Signed)
-  I sent antibiotics to the pharmacy for your tooth.  You should follow-up with a dentist. - We are checking you for COVID since your flu test was negative.  I will call you if is positive and message on MyChart.  If you do not hear from me is negative.  Also sent you a message on MyChart.  If you are positive need to isolate for another 3 days and then wear mask x 5 days.  We can also discuss antiviral medication if you desire that. - I sent nausea medication to pharmacy.  Take Mucinex as well.  Rest and fluids.  Follow-up as needed.

## 2022-04-10 NOTE — ED Provider Notes (Signed)
[facility]    CSN: [id number] Arrival date & time: 04/10/22  1358      History   Chief Complaint Chief Complaint  Patient presents with   Cough    HPI Elizabeth Flynn is a 52 y.o. female presenting for 2-day history of cough, congestion, nausea/vomiting, body aches, diarrhea.  Denies fever.  Denies sore throat, breathing difficulty or wheezing, abdominal pain.  Reports she has been exposed to influenza.  Has been taking OTC meds as needed for symptoms.  Additionally, patient reports pain of the tooth of the left upper side for a while.  She reports that the tooth is broken.  She has been taking high doses of ibuprofen for pain.  Medical history significant for cardiomyopathy, breast cancer, anxiety, hypertension and kidney stones as well as previous stroke.  HPI  Past Medical History:  Diagnosis Date   Anemia    Anxiety    takes nothing   Blood transfusion without reported diagnosis 2016   patient has had 25 transfusions throughout her life. her blood levels run chronically low   BRCA negative 10-16-14   Breast cancer ([place]) 2016   RT MASTECTOMY   Cancer of right breast ([place]) 10/16/2014   Upper and inner quadrant right breast, Invasive ductal carcinoma, Atypical Ductal Hyperplasia, ER/PR Pos, Her 2 Neg   Cardiomyopathy ([facility])    Depression    Diarrhea    Dysrhythmia    TACHYCARDIA...happens randomly. not seen by any cardiologist   Enlarged heart    GERD (gastroesophageal reflux disease)    h/o- no meds since hernia surgery   Headache    Hypertension    Kidney stones    MRSA infection 2009   Ovarian cyst    Pancreatitis    PONV (postoperative nausea and vomiting)    DRY HEAVING AFTER BREAST LUMPECTOMY 10-2014   Shortness of breath dyspnea    with exertion   Stroke [facility])    Sept. 2014 rt sided bleed   Stroke [facility])     Patient Active Problem List   Diagnosis Date Noted   Iron deficiency anemia 10/28/2015   Status post vaginal hysterectomy 06/12/2015    S/P total hysterectomy and BSO (bilateral salpingo-oophorectomy) 05/01/2015   Abnormal uterine bleeding (AUB) 04/29/2015   Status post D&C 04/11/2015   Cellulitis of chest wall 01/31/2015   Blister of right chest wall with infection 01/30/2015   Malignant neoplasm of midline of right breast ([place]) 10/16/2014   Absolute anemia 12/07/2012   Clinical depression 12/07/2012   Cerebrovascular accident, old 12/07/2012   Near syncope 12/07/2012   BP (high blood pressure) 12/07/2012    Past Surgical History:  Procedure Laterality Date   ABDOMINAL HYSTERECTOMY     BREAST BIOPSY Right 10/10/2014   Right breast invasive carcinoma. T1A, N0.   BREAST LUMPECTOMY WITH SENTINEL LYMPH NODE BIOPSY Right 10/23/2014   Procedure: Right breast lumpectomy with sentinel node biopsy ;  Surgeon: Christene Lye, MD;  Location: [facility] ORS;  Service: General;  Laterality: Right;   CHOLECYSTECTOMY     DILATATION & CURRETTAGE/HYSTEROSCOPY WITH RESECTOCOPE N/A 03/18/2015   Procedure: [place];  Surgeon: Brayton Mars, MD;  Location: [facility] ORS;  Service: Gynecology;  Laterality: N/A;   HERNIA REPAIR     LITHOTRIPSY     MASTECTOMY Right 2016   BREAST CA   MASTECTOMY MODIFIED RADICAL Right 12/05/2014   Procedure: MASTECTOMY MODIFIED RADICAL;  Surgeon: Christene Lye, MD;  Location: [facility] OR[facility];  Service:  General;  Laterality: Right;   OOPHORECTOMY Bilateral    PORTACATH PLACEMENT Right 11/12/2015   Procedure: INSERTION PORT-A-CATH;  Surgeon: Christene Lye, MD;  Location: [facility] OR[facility];  Service: General;  Laterality: Right;   TONSILLECTOMY     TOTAL MASTECTOMY Left 11/12/2015   Procedure: TOTAL MASTECTOMY;  Surgeon: Christene Lye, MD;  Location: [facility] OR[facility];  Service: General;  Laterality: Left;   TUBAL LIGATION     VAGINAL HYSTERECTOMY Bilateral 04/29/2015   Procedure: HYSTERECTOMY VAGINAL/ BILATERAL SALPINGOOPHORECTOMY;  Surgeon: Brayton Mars,  MD;  Location: [facility] OR[facility];  Service: Gynecology;  Laterality: Bilateral;    OB History     Gravida  5   Para  2   Term  2   Preterm      AB  3   Living  2      SAB  2   IAB  1   Ectopic      Multiple      Live Births  2            Home Medications    Prior to Admission medications   Medication Sig Start Date End Date Taking? Authorizing Provider  amoxicillin-clavulanate (AUGMENTIN) 875-125 MG tablet Take 1 tablet by mouth every 12 (twelve) hours for 7 days. 04/10/22 04/17/22 Yes Laurene Footman B, PA-C  ondansetron (ZOFRAN-ODT) 4 MG disintegrating tablet Take 1 tablet (4 mg total) by mouth every 6 (six) hours as needed for nausea or vomiting. 04/10/22  Yes Danton Clap, PA-C  acetaminophen (TYLENOL) 500 MG tablet Take 2 tablets (1,000 mg total) by mouth every 6 (six) hours as needed. For pain 02/02/15   Gladstone Lighter, MD  amLODipine (NORVASC) 10 MG tablet Take 1 tablet (10 mg total) by mouth daily. Patient taking differently: Take 10 mg by mouth every morning. 02/02/15   Gladstone Lighter, MD  aspirin 325 MG tablet Take 325 mg by mouth as needed.    [provider]  atorvastatin (LIPITOR) 40 MG tablet Take 40 mg by mouth daily.    [provider]  ferrous fumarate-iron polysaccharide complex (TANDEM) 162-115.2 MG CAPS capsule Take 1 capsule by mouth daily with breakfast. 09/27/15   Herring, Orville Govern, NP  FLUoxetine (PROZAC) 20 MG capsule TAKE ONE TABLET BY MOUTH DAILY FOR DEPRESSION-am 07/23/15   [provider]  lamoTRIgine (LAMICTAL) 25 MG tablet Take 25 mg by mouth every morning.     [provider]  lisinopril (PRINIVIL,ZESTRIL) 20 MG tablet Take 1 tablet (20 mg total) by mouth every evening. Patient taking differently: Take 20 mg by mouth every morning. 02/02/15   Gladstone Lighter, MD  meloxicam (MOBIC) 7.5 MG tablet TAKE 1 TABLET (7.5 MG TOTAL) BY MOUTH DAILY. 11/29/15   Lloyd Huger, MD  methocarbamol (ROBAXIN) 500  MG tablet Take 500 mg by mouth 2 (two) times daily.    [provider]  promethazine (PHENERGAN) 12.5 MG suppository Place 1 suppository (12.5 mg total) rectally every 6 (six) hours as needed for nausea or vomiting. 11/12/15   Sankar, Andreas Newport, MD  QUEtiapine (SEROQUEL) 50 MG tablet Take 50 mg by mouth at bedtime.    [provider]  tamoxifen (NOLVADEX) 20 MG tablet TAKE 1 TABLET (20 MG TOTAL) BY MOUTH DAILY. 06/15/16   Lloyd Huger, MD  venlafaxine (EFFEXOR) 37.5 MG tablet Take 37.5 mg by mouth every morning.     [provider]  vitamin C (ASCORBIC ACID) 500 MG tablet Take 500 mg by  mouth daily.    [provider]  zolpidem (AMBIEN) 5 MG tablet Take 1 tablet (5 mg total) by mouth at bedtime as needed for sleep. 09/26/15   Evlyn Kanner, NP    Family History Family History  Problem Relation Age of Onset   Breast cancer Cousin 68   Heart disease Maternal Grandmother    Diabetes Paternal Grandmother    Heart disease Paternal Grandmother    Ovarian cancer Neg Hx    Colon cancer Neg Hx     Social History Social History   Tobacco Use   Smoking status: Former    Types: Cigarettes    Quit date: 04/26/1995    Years since quitting: 26.9   Smokeless tobacco: Never   Tobacco comments:    no passive smoke in home  Vaping Use   Vaping Use: Never used  Substance Use Topics   Alcohol use: No    Alcohol/week: 0.0 standard drinks of alcohol    Comment: SOBER FOR 3 [facility](1275)   Drug use: No     Allergies   Hydrocodone-acetaminophen, Reglan [metoclopramide], Clindamycin/lincomycin, and Morphine and related   Review of Systems Review of Systems  Constitutional:  Positive for fatigue. Negative for chills, diaphoresis and fever.  HENT:  Positive for congestion, dental problem and rhinorrhea. Negative for ear pain, sinus pressure, sinus pain and sore throat.   Respiratory:  Positive for cough. Negative for shortness of breath.    Cardiovascular:  Negative for chest pain.  Gastrointestinal:  Positive for diarrhea, nausea and vomiting. Negative for abdominal pain.  Musculoskeletal:  Positive for arthralgias and myalgias.  Skin:  Negative for rash.  Neurological:  Negative for weakness and headaches.  Hematological:  Negative for adenopathy.     Physical Exam Triage Vital Signs ED Triage Vitals  Enc Vitals Group     BP      Pulse      Resp      Temp      Temp src      SpO2      Weight      Height      Head Circumference      Peak Flow      Pain Score      Pain Loc      Pain Edu?      Excl. in [place]?    No data found.  Updated Vital Signs BP (!) 172/90 (BP Location: Left Arm)   Pulse 85   Temp 98 F (36.7 C) (Oral)   Resp 14   Ht '5\' 7"'$  (1.702 m)   Wt 212 lb (96.2 kg)   LMP 03/26/2015   SpO2 98%   BMI 33.20 kg/m     Physical Exam Vitals and nursing note reviewed.  Constitutional:      General: She is not in acute distress.    Appearance: Normal appearance. She is not ill-appearing or toxic-appearing.  HENT:     Head: Normocephalic and atraumatic.     Nose: Congestion present.     Mouth/Throat:     Mouth: Mucous membranes are moist.     Pharynx: Oropharynx is clear. Posterior oropharyngeal erythema present.      Comments: Tooth in picture has an area which is consistent with a hole in the tooth.  Some surrounding erythema.  Tooth is tender to palpation. Eyes:     General: No scleral icterus.       Right eye: No discharge.  Left eye: No discharge.     Conjunctiva/sclera: Conjunctivae normal.  Cardiovascular:     Rate and Rhythm: Normal rate and regular rhythm.     Heart sounds: Normal heart sounds.  Pulmonary:     Effort: Pulmonary effort is normal. No respiratory distress.     Breath sounds: Normal breath sounds.  Abdominal:     Palpations: Abdomen is soft.     Tenderness: There is no abdominal tenderness.  Musculoskeletal:     Cervical back: Neck supple.  Skin:     General: Skin is dry.  Neurological:     General: No focal deficit present.     Mental Status: She is alert. Mental status is at baseline.     Motor: No weakness.     Gait: Gait normal.  Psychiatric:        Mood and Affect: Mood normal.        Behavior: Behavior normal.        Thought Content: Thought content normal.      UC Treatments / Results  Labs (all labs ordered are listed, but only abnormal results are displayed) Labs Reviewed  RAPID INFLUENZA A&B ANTIGENS  SARS CORONAVIRUS 2 BY RT PCR    EKG   Radiology No results found.  Procedures Procedures (including critical care time)  Medications Ordered in UC Medications - No data to display  Initial Impression / Assessment and Plan / UC Course  I have reviewed the triage vital signs and the nursing notes.  Pertinent labs & imaging results that were available during my care of the patient were reviewed by me and considered in my medical decision making (see chart for details).   52 year old female presenting for cough, congestion, nausea/vomiting and diarrhea for the past 2 days.  Exposure to the flu.  Also reporting dental pain for the past several days.  She is afebrile and overall well-appearing.  On exam she has a broken tooth with surrounding erythema consistent with mild infection.  Also nasal congestion and mild posterior pharyngeal erythema.  Chest clear to auscultation heart regular rate rhythm.  Rapid flu test is negative.  PCR COVID test obtained.  Advised patient I will contact her with the result if positive.  Will consider antiviral therapy if positive for COVID.  Treating dental pain and suspected infection with Augmentin and advised to follow-up with a dentist.  Also prescribe Zofran.  Negative COVID test. Reviewed that she has a viral URI.  Supportive care.  Reviewed return precautions.  Final Clinical Impressions(s) / UC Diagnoses   Final diagnoses:  Dentalgia  Acute cough  Nausea and  vomiting, unspecified vomiting type  Viral upper respiratory tract infection     Discharge Instructions      -I sent antibiotics to the pharmacy for your tooth.  You should follow-up with a dentist. - We are checking you for COVID since your flu test was negative.  I will call you if is positive and message on MyChart.  If you do not hear from me is negative.  Also sent you a message on MyChart.  If you are positive need to isolate for another 3 days and then wear mask x 5 days.  We can also discuss antiviral medication if you desire that. - I sent nausea medication to pharmacy.  Take Mucinex as well.  Rest and fluids.  Follow-up as needed.     ED Prescriptions     Medication Sig Dispense Auth. Provider   amoxicillin-clavulanate (AUGMENTIN) 875-125 MG  tablet Take 1 tablet by mouth every 12 (twelve) hours for 7 days. 14 tablet Laurene Footman B, PA-C   ondansetron (ZOFRAN-ODT) 4 MG disintegrating tablet Take 1 tablet (4 mg total) by mouth every 6 (six) hours as needed for nausea or vomiting. 15 tablet Gretta Cool      PDMP not reviewed this encounter.   Danton Clap, PA-C 04/10/22 1538

## 2022-04-10 NOTE — ED Triage Notes (Signed)
Patient c/o cough, diarrhea, and bodyaches, runny nose that started Wed night.  Patient states that she has been exposed to the flu. Patient denies fevers.
